# Patient Record
Sex: Male | Born: 1935 | Race: White | Hispanic: No | Marital: Married | Smoking: Former smoker
Health system: Southern US, Community
[De-identification: ages and names within clinical notes are randomized; demographics above are authoritative.]

## PROBLEM LIST (undated history)

## (undated) DIAGNOSIS — I2510 Atherosclerotic heart disease of native coronary artery without angina pectoris: Secondary | ICD-10-CM

## (undated) DIAGNOSIS — I10 Essential (primary) hypertension: Secondary | ICD-10-CM

## (undated) DIAGNOSIS — J449 Chronic obstructive pulmonary disease, unspecified: Secondary | ICD-10-CM

## (undated) DIAGNOSIS — N289 Disorder of kidney and ureter, unspecified: Secondary | ICD-10-CM

## (undated) DIAGNOSIS — I639 Cerebral infarction, unspecified: Secondary | ICD-10-CM

## (undated) DIAGNOSIS — Z950 Presence of cardiac pacemaker: Secondary | ICD-10-CM

## (undated) DIAGNOSIS — J439 Emphysema, unspecified: Secondary | ICD-10-CM

---

## 2003-10-19 ENCOUNTER — Other Ambulatory Visit: Payer: Self-pay

## 2004-09-09 ENCOUNTER — Ambulatory Visit: Payer: Self-pay | Admitting: Ophthalmology

## 2004-09-16 ENCOUNTER — Ambulatory Visit: Payer: Self-pay | Admitting: Ophthalmology

## 2006-02-04 ENCOUNTER — Ambulatory Visit: Payer: Self-pay | Admitting: Nurse Practitioner

## 2007-03-09 ENCOUNTER — Ambulatory Visit: Payer: Self-pay | Admitting: Unknown Physician Specialty

## 2009-03-25 ENCOUNTER — Inpatient Hospital Stay: Payer: Self-pay | Admitting: Internal Medicine

## 2009-10-30 ENCOUNTER — Ambulatory Visit: Payer: Self-pay | Admitting: Urology

## 2010-02-25 ENCOUNTER — Inpatient Hospital Stay: Payer: Self-pay | Admitting: Internal Medicine

## 2014-03-02 DIAGNOSIS — J449 Chronic obstructive pulmonary disease, unspecified: Secondary | ICD-10-CM | POA: Diagnosis present

## 2014-09-24 DIAGNOSIS — I340 Nonrheumatic mitral (valve) insufficiency: Secondary | ICD-10-CM | POA: Diagnosis present

## 2015-04-24 DIAGNOSIS — I10 Essential (primary) hypertension: Secondary | ICD-10-CM | POA: Diagnosis present

## 2015-05-01 DIAGNOSIS — I071 Rheumatic tricuspid insufficiency: Secondary | ICD-10-CM | POA: Diagnosis present

## 2016-02-05 ENCOUNTER — Other Ambulatory Visit: Payer: Self-pay | Admitting: Internal Medicine

## 2016-02-05 DIAGNOSIS — I739 Peripheral vascular disease, unspecified: Secondary | ICD-10-CM

## 2016-02-05 DIAGNOSIS — Z8673 Personal history of transient ischemic attack (TIA), and cerebral infarction without residual deficits: Secondary | ICD-10-CM

## 2016-02-07 ENCOUNTER — Ambulatory Visit
Admission: RE | Admit: 2016-02-07 | Discharge: 2016-02-07 | Disposition: A | Discharge status: Home / Self Care | Payer: Medicare Other | Source: Ambulatory Visit | Attending: Internal Medicine | Admitting: Internal Medicine

## 2016-02-07 DIAGNOSIS — I739 Peripheral vascular disease, unspecified: Secondary | ICD-10-CM | POA: Diagnosis present

## 2016-02-07 DIAGNOSIS — I6523 Occlusion and stenosis of bilateral carotid arteries: Secondary | ICD-10-CM | POA: Insufficient documentation

## 2016-09-30 ENCOUNTER — Other Ambulatory Visit: Payer: Self-pay | Admitting: Internal Medicine

## 2016-09-30 DIAGNOSIS — N644 Mastodynia: Secondary | ICD-10-CM

## 2016-09-30 DIAGNOSIS — N62 Hypertrophy of breast: Secondary | ICD-10-CM

## 2016-10-09 ENCOUNTER — Ambulatory Visit
Admission: RE | Admit: 2016-10-09 | Discharge: 2016-10-09 | Disposition: A | Discharge status: Home / Self Care | Payer: Medicare Other | Source: Ambulatory Visit | Attending: Internal Medicine | Admitting: Internal Medicine

## 2016-10-09 ENCOUNTER — Other Ambulatory Visit: Payer: Self-pay | Admitting: Internal Medicine

## 2016-10-09 DIAGNOSIS — N62 Hypertrophy of breast: Secondary | ICD-10-CM

## 2016-10-09 DIAGNOSIS — N644 Mastodynia: Secondary | ICD-10-CM

## 2017-04-19 DIAGNOSIS — I5022 Chronic systolic (congestive) heart failure: Secondary | ICD-10-CM | POA: Diagnosis present

## 2017-05-26 ENCOUNTER — Other Ambulatory Visit
Admission: RE | Admit: 2017-05-26 | Discharge: 2017-05-26 | Disposition: A | Discharge status: Home / Self Care | Payer: Medicare Other | Source: Ambulatory Visit | Attending: Internal Medicine | Admitting: Internal Medicine

## 2017-05-26 ENCOUNTER — Other Ambulatory Visit
Admission: RE | Admit: 2017-05-26 | Discharge: 2017-05-26 | Disposition: A | Discharge status: Home / Self Care | Payer: Medicare Other | Source: Ambulatory Visit | Attending: Unknown Physician Specialty | Admitting: Unknown Physician Specialty

## 2017-05-26 DIAGNOSIS — R194 Change in bowel habit: Secondary | ICD-10-CM | POA: Diagnosis present

## 2017-05-26 DIAGNOSIS — R197 Diarrhea, unspecified: Secondary | ICD-10-CM | POA: Diagnosis present

## 2017-05-26 LAB — GASTROINTESTINAL PANEL BY PCR, STOOL (REPLACES STOOL CULTURE)

## 2017-05-26 LAB — C DIFFICILE QUICK SCREEN W PCR REFLEX
C Diff antigen: NEGATIVE
C Diff interpretation: NOT DETECTED
C Diff toxin: NEGATIVE

## 2017-05-28 ENCOUNTER — Other Ambulatory Visit: Payer: Self-pay | Admitting: Gastroenterology

## 2017-05-28 DIAGNOSIS — R197 Diarrhea, unspecified: Secondary | ICD-10-CM

## 2017-05-28 DIAGNOSIS — R634 Abnormal weight loss: Secondary | ICD-10-CM

## 2017-05-28 DIAGNOSIS — R194 Change in bowel habit: Secondary | ICD-10-CM

## 2017-06-17 ENCOUNTER — Ambulatory Visit: Payer: Medicare Other

## 2019-09-01 ENCOUNTER — Other Ambulatory Visit: Payer: Self-pay | Admitting: Internal Medicine

## 2019-09-01 DIAGNOSIS — R310 Gross hematuria: Secondary | ICD-10-CM

## 2019-09-14 ENCOUNTER — Ambulatory Visit
Admission: RE | Admit: 2019-09-14 | Discharge: 2019-09-14 | Disposition: A | Discharge status: Home / Self Care | Payer: Medicare Other | Source: Ambulatory Visit | Attending: Internal Medicine | Admitting: Internal Medicine

## 2019-09-14 ENCOUNTER — Other Ambulatory Visit: Payer: Self-pay

## 2019-09-14 DIAGNOSIS — R310 Gross hematuria: Secondary | ICD-10-CM | POA: Diagnosis present

## 2019-11-20 ENCOUNTER — Other Ambulatory Visit: Payer: Self-pay | Admitting: Internal Medicine

## 2019-12-01 ENCOUNTER — Other Ambulatory Visit: Payer: Self-pay | Admitting: Internal Medicine

## 2019-12-01 DIAGNOSIS — R29898 Other symptoms and signs involving the musculoskeletal system: Secondary | ICD-10-CM

## 2019-12-01 DIAGNOSIS — M5116 Intervertebral disc disorders with radiculopathy, lumbar region: Secondary | ICD-10-CM

## 2019-12-05 ENCOUNTER — Other Ambulatory Visit: Payer: Self-pay

## 2019-12-05 ENCOUNTER — Ambulatory Visit
Admission: RE | Admit: 2019-12-05 | Discharge: 2019-12-05 | Disposition: A | Discharge status: Home / Self Care | Payer: Medicare PPO | Source: Ambulatory Visit | Attending: Internal Medicine | Admitting: Internal Medicine

## 2019-12-05 DIAGNOSIS — R29898 Other symptoms and signs involving the musculoskeletal system: Secondary | ICD-10-CM | POA: Insufficient documentation

## 2019-12-05 DIAGNOSIS — M5116 Intervertebral disc disorders with radiculopathy, lumbar region: Secondary | ICD-10-CM | POA: Diagnosis not present

## 2020-01-01 DIAGNOSIS — R2689 Other abnormalities of gait and mobility: Secondary | ICD-10-CM | POA: Diagnosis not present

## 2020-01-04 DIAGNOSIS — I482 Chronic atrial fibrillation, unspecified: Secondary | ICD-10-CM | POA: Diagnosis not present

## 2020-01-05 DIAGNOSIS — R2689 Other abnormalities of gait and mobility: Secondary | ICD-10-CM | POA: Diagnosis not present

## 2020-02-02 DIAGNOSIS — I480 Paroxysmal atrial fibrillation: Secondary | ICD-10-CM | POA: Diagnosis not present

## 2020-02-13 DIAGNOSIS — I495 Sick sinus syndrome: Secondary | ICD-10-CM | POA: Diagnosis not present

## 2020-02-20 DIAGNOSIS — R739 Hyperglycemia, unspecified: Secondary | ICD-10-CM | POA: Diagnosis not present

## 2020-02-20 DIAGNOSIS — I482 Chronic atrial fibrillation, unspecified: Secondary | ICD-10-CM | POA: Diagnosis not present

## 2020-02-20 DIAGNOSIS — E782 Mixed hyperlipidemia: Secondary | ICD-10-CM | POA: Diagnosis not present

## 2020-02-20 DIAGNOSIS — I10 Essential (primary) hypertension: Secondary | ICD-10-CM | POA: Diagnosis not present

## 2020-02-20 DIAGNOSIS — D692 Other nonthrombocytopenic purpura: Secondary | ICD-10-CM | POA: Diagnosis not present

## 2020-02-20 DIAGNOSIS — M1000 Idiopathic gout, unspecified site: Secondary | ICD-10-CM | POA: Diagnosis not present

## 2020-02-20 DIAGNOSIS — I480 Paroxysmal atrial fibrillation: Secondary | ICD-10-CM | POA: Diagnosis not present

## 2020-02-20 DIAGNOSIS — I739 Peripheral vascular disease, unspecified: Secondary | ICD-10-CM | POA: Diagnosis not present

## 2020-02-20 DIAGNOSIS — I25810 Atherosclerosis of coronary artery bypass graft(s) without angina pectoris: Secondary | ICD-10-CM | POA: Diagnosis not present

## 2020-02-29 DIAGNOSIS — D692 Other nonthrombocytopenic purpura: Secondary | ICD-10-CM | POA: Diagnosis not present

## 2020-02-29 DIAGNOSIS — I10 Essential (primary) hypertension: Secondary | ICD-10-CM | POA: Diagnosis not present

## 2020-02-29 DIAGNOSIS — I482 Chronic atrial fibrillation, unspecified: Secondary | ICD-10-CM | POA: Diagnosis not present

## 2020-02-29 DIAGNOSIS — I495 Sick sinus syndrome: Secondary | ICD-10-CM | POA: Diagnosis not present

## 2020-02-29 DIAGNOSIS — I739 Peripheral vascular disease, unspecified: Secondary | ICD-10-CM | POA: Diagnosis not present

## 2020-02-29 DIAGNOSIS — I272 Pulmonary hypertension, unspecified: Secondary | ICD-10-CM | POA: Diagnosis not present

## 2020-02-29 DIAGNOSIS — I5022 Chronic systolic (congestive) heart failure: Secondary | ICD-10-CM | POA: Diagnosis not present

## 2020-02-29 DIAGNOSIS — I340 Nonrheumatic mitral (valve) insufficiency: Secondary | ICD-10-CM | POA: Diagnosis not present

## 2020-02-29 DIAGNOSIS — I25810 Atherosclerosis of coronary artery bypass graft(s) without angina pectoris: Secondary | ICD-10-CM | POA: Diagnosis not present

## 2020-03-19 DIAGNOSIS — I272 Pulmonary hypertension, unspecified: Secondary | ICD-10-CM | POA: Diagnosis not present

## 2020-03-19 DIAGNOSIS — E782 Mixed hyperlipidemia: Secondary | ICD-10-CM | POA: Diagnosis not present

## 2020-03-19 DIAGNOSIS — I5022 Chronic systolic (congestive) heart failure: Secondary | ICD-10-CM | POA: Diagnosis not present

## 2020-03-19 DIAGNOSIS — I10 Essential (primary) hypertension: Secondary | ICD-10-CM | POA: Diagnosis not present

## 2020-03-19 DIAGNOSIS — I482 Chronic atrial fibrillation, unspecified: Secondary | ICD-10-CM | POA: Diagnosis not present

## 2020-03-19 DIAGNOSIS — I25810 Atherosclerosis of coronary artery bypass graft(s) without angina pectoris: Secondary | ICD-10-CM | POA: Diagnosis not present

## 2020-03-25 DIAGNOSIS — I480 Paroxysmal atrial fibrillation: Secondary | ICD-10-CM | POA: Diagnosis not present

## 2020-03-25 DIAGNOSIS — E039 Hypothyroidism, unspecified: Secondary | ICD-10-CM | POA: Diagnosis not present

## 2020-04-15 DIAGNOSIS — R791 Abnormal coagulation profile: Secondary | ICD-10-CM | POA: Diagnosis not present

## 2020-04-15 DIAGNOSIS — E039 Hypothyroidism, unspecified: Secondary | ICD-10-CM | POA: Diagnosis not present

## 2020-04-25 DIAGNOSIS — I480 Paroxysmal atrial fibrillation: Secondary | ICD-10-CM | POA: Diagnosis not present

## 2020-04-26 DIAGNOSIS — I482 Chronic atrial fibrillation, unspecified: Secondary | ICD-10-CM | POA: Diagnosis not present

## 2020-04-26 DIAGNOSIS — I25810 Atherosclerosis of coronary artery bypass graft(s) without angina pectoris: Secondary | ICD-10-CM | POA: Diagnosis not present

## 2020-05-02 DIAGNOSIS — I272 Pulmonary hypertension, unspecified: Secondary | ICD-10-CM | POA: Diagnosis not present

## 2020-05-02 DIAGNOSIS — I25810 Atherosclerosis of coronary artery bypass graft(s) without angina pectoris: Secondary | ICD-10-CM | POA: Diagnosis not present

## 2020-05-02 DIAGNOSIS — I482 Chronic atrial fibrillation, unspecified: Secondary | ICD-10-CM | POA: Diagnosis not present

## 2020-05-02 DIAGNOSIS — E782 Mixed hyperlipidemia: Secondary | ICD-10-CM | POA: Diagnosis not present

## 2020-05-02 DIAGNOSIS — I495 Sick sinus syndrome: Secondary | ICD-10-CM | POA: Diagnosis not present

## 2020-05-02 DIAGNOSIS — I5022 Chronic systolic (congestive) heart failure: Secondary | ICD-10-CM | POA: Diagnosis not present

## 2020-05-02 DIAGNOSIS — I10 Essential (primary) hypertension: Secondary | ICD-10-CM | POA: Diagnosis not present

## 2020-05-09 DIAGNOSIS — I10 Essential (primary) hypertension: Secondary | ICD-10-CM | POA: Diagnosis not present

## 2020-05-09 DIAGNOSIS — R0602 Shortness of breath: Secondary | ICD-10-CM | POA: Diagnosis not present

## 2020-05-09 DIAGNOSIS — I495 Sick sinus syndrome: Secondary | ICD-10-CM | POA: Diagnosis not present

## 2020-05-09 DIAGNOSIS — J449 Chronic obstructive pulmonary disease, unspecified: Secondary | ICD-10-CM | POA: Diagnosis not present

## 2020-05-09 DIAGNOSIS — Z01818 Encounter for other preprocedural examination: Secondary | ICD-10-CM | POA: Diagnosis not present

## 2020-05-09 DIAGNOSIS — I25810 Atherosclerosis of coronary artery bypass graft(s) without angina pectoris: Secondary | ICD-10-CM | POA: Diagnosis not present

## 2020-05-09 DIAGNOSIS — I482 Chronic atrial fibrillation, unspecified: Secondary | ICD-10-CM | POA: Diagnosis not present

## 2020-05-20 DIAGNOSIS — I480 Paroxysmal atrial fibrillation: Secondary | ICD-10-CM | POA: Diagnosis not present

## 2020-05-31 ENCOUNTER — Other Ambulatory Visit
Admission: RE | Admit: 2020-05-31 | Discharge: 2020-05-31 | Disposition: A | Discharge status: Home / Self Care | Payer: Medicare HMO | Source: Ambulatory Visit | Attending: Cardiology | Admitting: Cardiology

## 2020-05-31 ENCOUNTER — Other Ambulatory Visit: Payer: Self-pay

## 2020-05-31 DIAGNOSIS — Z20822 Contact with and (suspected) exposure to covid-19: Secondary | ICD-10-CM | POA: Diagnosis not present

## 2020-05-31 DIAGNOSIS — Z01812 Encounter for preprocedural laboratory examination: Secondary | ICD-10-CM | POA: Diagnosis not present

## 2020-05-31 LAB — SARS CORONAVIRUS 2 (TAT 6-24 HRS): SARS Coronavirus 2: NEGATIVE

## 2020-06-04 ENCOUNTER — Other Ambulatory Visit: Payer: Self-pay

## 2020-06-04 ENCOUNTER — Ambulatory Visit: Payer: Medicare HMO | Admitting: Certified Registered Nurse Anesthetist

## 2020-06-04 ENCOUNTER — Encounter: Payer: Self-pay | Admitting: Cardiology

## 2020-06-04 ENCOUNTER — Encounter
Admission: RE | Disposition: A | Discharge status: Home / Self Care | Payer: Self-pay | Source: Home / Self Care | Attending: Cardiology

## 2020-06-04 ENCOUNTER — Ambulatory Visit
Admission: RE | Admit: 2020-06-04 | Discharge: 2020-06-04 | Disposition: A | Discharge status: Home / Self Care | Payer: Medicare HMO | Attending: Cardiology | Admitting: Cardiology

## 2020-06-04 DIAGNOSIS — I509 Heart failure, unspecified: Secondary | ICD-10-CM | POA: Diagnosis not present

## 2020-06-04 DIAGNOSIS — J449 Chronic obstructive pulmonary disease, unspecified: Secondary | ICD-10-CM | POA: Insufficient documentation

## 2020-06-04 DIAGNOSIS — Z91013 Allergy to seafood: Secondary | ICD-10-CM | POA: Diagnosis not present

## 2020-06-04 DIAGNOSIS — I739 Peripheral vascular disease, unspecified: Secondary | ICD-10-CM | POA: Diagnosis not present

## 2020-06-04 DIAGNOSIS — I482 Chronic atrial fibrillation, unspecified: Secondary | ICD-10-CM | POA: Diagnosis not present

## 2020-06-04 DIAGNOSIS — Z880 Allergy status to penicillin: Secondary | ICD-10-CM | POA: Diagnosis not present

## 2020-06-04 DIAGNOSIS — I10 Essential (primary) hypertension: Secondary | ICD-10-CM | POA: Diagnosis not present

## 2020-06-04 DIAGNOSIS — Z7901 Long term (current) use of anticoagulants: Secondary | ICD-10-CM | POA: Insufficient documentation

## 2020-06-04 DIAGNOSIS — M1990 Unspecified osteoarthritis, unspecified site: Secondary | ICD-10-CM | POA: Insufficient documentation

## 2020-06-04 DIAGNOSIS — Z79899 Other long term (current) drug therapy: Secondary | ICD-10-CM | POA: Diagnosis not present

## 2020-06-04 DIAGNOSIS — Z45010 Encounter for checking and testing of cardiac pacemaker pulse generator [battery]: Secondary | ICD-10-CM | POA: Diagnosis not present

## 2020-06-04 DIAGNOSIS — Z79890 Hormone replacement therapy (postmenopausal): Secondary | ICD-10-CM | POA: Insufficient documentation

## 2020-06-04 DIAGNOSIS — Z888 Allergy status to other drugs, medicaments and biological substances status: Secondary | ICD-10-CM | POA: Diagnosis not present

## 2020-06-04 DIAGNOSIS — I110 Hypertensive heart disease with heart failure: Secondary | ICD-10-CM | POA: Diagnosis not present

## 2020-06-04 DIAGNOSIS — E785 Hyperlipidemia, unspecified: Secondary | ICD-10-CM | POA: Insufficient documentation

## 2020-06-04 DIAGNOSIS — I2510 Atherosclerotic heart disease of native coronary artery without angina pectoris: Secondary | ICD-10-CM | POA: Insufficient documentation

## 2020-06-04 DIAGNOSIS — Z87891 Personal history of nicotine dependence: Secondary | ICD-10-CM | POA: Diagnosis not present

## 2020-06-04 DIAGNOSIS — Z8673 Personal history of transient ischemic attack (TIA), and cerebral infarction without residual deficits: Secondary | ICD-10-CM | POA: Diagnosis not present

## 2020-06-04 DIAGNOSIS — I272 Pulmonary hypertension, unspecified: Secondary | ICD-10-CM | POA: Insufficient documentation

## 2020-06-04 DIAGNOSIS — E039 Hypothyroidism, unspecified: Secondary | ICD-10-CM | POA: Insufficient documentation

## 2020-06-04 DIAGNOSIS — I495 Sick sinus syndrome: Secondary | ICD-10-CM | POA: Insufficient documentation

## 2020-06-04 DIAGNOSIS — Z951 Presence of aortocoronary bypass graft: Secondary | ICD-10-CM | POA: Insufficient documentation

## 2020-06-04 DIAGNOSIS — Z45018 Encounter for adjustment and management of other part of cardiac pacemaker: Secondary | ICD-10-CM | POA: Diagnosis not present

## 2020-06-04 HISTORY — DX: Atherosclerotic heart disease of native coronary artery without angina pectoris: I25.10

## 2020-06-04 HISTORY — DX: Essential (primary) hypertension: I10

## 2020-06-04 HISTORY — DX: Presence of cardiac pacemaker: Z95.0

## 2020-06-04 HISTORY — DX: Chronic obstructive pulmonary disease, unspecified: J44.9

## 2020-06-04 HISTORY — PX: PACEMAKER INSERTION: SHX728

## 2020-06-04 SURGERY — INSERTION, CARDIAC PACEMAKER
Anesthesia: Monitor Anesthesia Care

## 2020-06-04 MED ORDER — VANCOMYCIN HCL IN DEXTROSE 1-5 GM/200ML-% IV SOLN: 1000.0000 mg | INTRAVENOUS | Status: DC

## 2020-06-04 MED ORDER — SODIUM CHLORIDE 0.9 % IV SOLN: INTRAVENOUS | Status: DC

## 2020-06-04 MED ORDER — FENTANYL CITRATE (PF) 100 MCG/2ML IJ SOLN: 25.0000 ug | INTRAMUSCULAR | Status: DC | PRN

## 2020-06-04 MED ORDER — PHENYLEPHRINE HCL (PRESSORS) 10 MG/ML IV SOLN
INTRAVENOUS | Status: DC | PRN
  Administered 2020-06-04 (×2): 100 ug via INTRAVENOUS

## 2020-06-04 MED ORDER — ONDANSETRON HCL 4 MG/2ML IJ SOLN: 4.0000 mg | Freq: Once | INTRAMUSCULAR | Status: DC | PRN

## 2020-06-04 MED ORDER — CLARITHROMYCIN 500 MG PO TABS: 500.0000 mg | ORAL_TABLET | Freq: Two times a day (BID) | ORAL | 0 refills | Status: AC

## 2020-06-04 MED ORDER — SODIUM CHLORIDE 0.9 % IV SOLN
80.0000 mg | INTRAVENOUS | Status: DC
  Filled 2020-06-04: qty 2

## 2020-06-04 MED ORDER — PROPOFOL 500 MG/50ML IV EMUL
INTRAVENOUS | Status: DC | PRN
  Administered 2020-06-04: 50 ug/kg/min via INTRAVENOUS

## 2020-06-04 MED ORDER — GENTAMICIN SULFATE 40 MG/ML IJ SOLN
INTRAMUSCULAR | Status: AC
  Filled 2020-06-04: qty 2

## 2020-06-04 MED ORDER — FENTANYL CITRATE (PF) 100 MCG/2ML IJ SOLN
INTRAMUSCULAR | Status: AC
  Filled 2020-06-04: qty 2

## 2020-06-04 MED ORDER — FENTANYL CITRATE (PF) 100 MCG/2ML IJ SOLN
INTRAMUSCULAR | Status: DC | PRN
Start: 2020-06-04 — End: 2020-06-04
  Administered 2020-06-04: 50 ug via INTRAVENOUS
  Administered 2020-06-04 (×2): 25 ug via INTRAVENOUS

## 2020-06-04 MED ORDER — LIDOCAINE 1 % OPTIME INJ - NO CHARGE
INTRAMUSCULAR | Status: DC | PRN
  Administered 2020-06-04: 25 mL

## 2020-06-04 MED ORDER — PROPOFOL 500 MG/50ML IV EMUL
INTRAVENOUS | Status: AC
  Filled 2020-06-04: qty 50

## 2020-06-04 MED ORDER — VANCOMYCIN HCL IN DEXTROSE 1-5 GM/200ML-% IV SOLN
INTRAVENOUS | Status: AC
  Filled 2020-06-04: qty 200

## 2020-06-04 MED ORDER — LIDOCAINE HCL (PF) 1 % IJ SOLN
INTRAMUSCULAR | Status: AC
  Filled 2020-06-04: qty 30

## 2020-06-04 MED ORDER — SODIUM CHLORIDE 0.9 % IV SOLN: INTRAVENOUS | Status: DC | PRN

## 2020-06-04 SURGICAL SUPPLY — 38 items
BAG DECANTER FOR FLEXI CONT (MISCELLANEOUS) ×3 IMPLANT
BRUSH SCRUB EZ  4% CHG (MISCELLANEOUS) ×2
BRUSH SCRUB EZ 4% CHG (MISCELLANEOUS) ×1 IMPLANT
CABLE SURG 12 DISP A/V CHANNEL (MISCELLANEOUS) ×3 IMPLANT
CANISTER SUCT 1200ML W/VALVE (MISCELLANEOUS) ×3 IMPLANT
CHLORAPREP W/TINT 26 (MISCELLANEOUS) ×3 IMPLANT
COVER LIGHT HANDLE STERIS (MISCELLANEOUS) ×6 IMPLANT
COVER MAYO STAND REUSABLE (DRAPES) ×3 IMPLANT
COVER WAND RF STERILE (DRAPES) ×3 IMPLANT
DRAPE C-ARM XRAY 36X54 (DRAPES) IMPLANT
DRSG TEGADERM 4X4.75 (GAUZE/BANDAGES/DRESSINGS) ×3 IMPLANT
DRSG TELFA 4X3 1S NADH ST (GAUZE/BANDAGES/DRESSINGS) ×3 IMPLANT
ELECT REM PT RETURN 9FT ADLT (ELECTROSURGICAL) ×3
ELECTRODE REM PT RTRN 9FT ADLT (ELECTROSURGICAL) ×1 IMPLANT
GLIDEWIRE STIFF .35X180X3 HYDR (WIRE) IMPLANT
GLOVE BIO SURGEON STRL SZ7.5 (GLOVE) ×3 IMPLANT
GLOVE BIO SURGEON STRL SZ8 (GLOVE) ×3 IMPLANT
GOWN STRL REUS W/ TWL LRG LVL3 (GOWN DISPOSABLE) ×1 IMPLANT
GOWN STRL REUS W/ TWL XL LVL3 (GOWN DISPOSABLE) ×1 IMPLANT
GOWN STRL REUS W/TWL LRG LVL3 (GOWN DISPOSABLE) ×2
GOWN STRL REUS W/TWL XL LVL3 (GOWN DISPOSABLE) ×2
IMMOBILIZER SHDR MD LX WHT (SOFTGOODS) IMPLANT
IMMOBILIZER SHDR XL LX WHT (SOFTGOODS) IMPLANT
INTRO PACEMAKR LEAD 9FR 13CM (INTRODUCER) ×3
INTRO PACEMKR SHEATH II 7FR (MISCELLANEOUS)
INTRODUCER PACEMKR LD 9FR 13CM (INTRODUCER) ×1 IMPLANT
INTRODUCER PACEMKR SHTH II 7FR (MISCELLANEOUS) IMPLANT
IPG PACE AZUR XT SR MRI W1SR01 (Pacemaker) ×1 IMPLANT
IV NS 500ML (IV SOLUTION) ×2
IV NS 500ML BAXH (IV SOLUTION) ×1 IMPLANT
KIT TURNOVER KIT A (KITS) ×3 IMPLANT
KIT WRENCH (KITS) ×3 IMPLANT
LABEL OR SOLS (LABEL) ×3 IMPLANT
MARKER SKIN DUAL TIP RULER LAB (MISCELLANEOUS) ×3 IMPLANT
PACE AZURE XT SR MRI W1SR01 (Pacemaker) ×3 IMPLANT
PACK PACE INSERTION (MISCELLANEOUS) ×3 IMPLANT
PAD ONESTEP ZOLL R SERIES ADT (MISCELLANEOUS) ×3 IMPLANT
SUT SILK 0 SH 30 (SUTURE) ×9 IMPLANT

## 2020-06-04 NOTE — Anesthesia Preprocedure Evaluation (Addendum)
Anesthesia Evaluation  Patient identified by MRN, date of birth, ID band Patient awake    Reviewed: Allergy & Precautions, NPO status , Patient's Chart, lab work & pertinent test results, reviewed documented beta blocker date and time   Airway Mallampati: III       Dental   Pulmonary COPD,    Pulmonary exam normal        Cardiovascular hypertension, Pt. on medications and Pt. on home beta blockers + Peripheral Vascular Disease and +CHF  + dysrhythmias Atrial Fibrillation + pacemaker + Valvular Problems/Murmurs MR   + Pulmonary HTN Sick sinus syndrome   Neuro/Psych TIAnegative psych ROS   GI/Hepatic Neg liver ROS,   Endo/Other  Hypothyroidism   Renal/GU      Musculoskeletal  (+) Arthritis , Osteoarthritis,    Abdominal Normal abdominal exam  (+)   Peds  Hematology negative hematology ROS (+)   Anesthesia Other Findings EF 35-40%  Reproductive/Obstetrics                          Anesthesia Physical Anesthesia Plan  ASA: III  Anesthesia Plan: MAC   Post-op Pain Management:    Induction:   PONV Risk Score and Plan: TIVA  Airway Management Planned: Nasal Cannula  Additional Equipment:   Intra-op Plan:   Post-operative Plan:   Informed Consent: I have reviewed the patients History and Physical, chart, labs and discussed the procedure including the risks, benefits and alternatives for the proposed anesthesia with the patient or authorized representative who has indicated his/her understanding and acceptance.     Dental advisory given  Plan Discussed with: CRNA and Surgeon  Anesthesia Plan Comments:         Anesthesia Quick Evaluation

## 2020-06-04 NOTE — Op Note (Signed)
[facility]   06/04/2020                     1:00 PM  PATIENT:  Bryan Singleton    PRE-OPERATIVE DIAGNOSIS:  pacemaker change out  POST-OPERATIVE DIAGNOSIS:  Same  PROCEDURE:  GENERATOR Change out  SURGEON:  Marcina Millard, MD    ANESTHESIA:     PREOPERATIVE INDICATIONS:  Bryan Singleton is a  86 y.o. male with a diagnosis of pacemaker change out who failed conservative measures and elected for surgical management.    The risks benefits and alternatives were discussed with the patient preoperatively including but not limited to the risks of infection, bleeding, cardiopulmonary complications, the need for revision surgery, among others, and the patient was willing to proceed.   OPERATIVE PROCEDURE: The patient was brought to the operating room in a fasting state.  The left pectoral region was prepped and draped in usual sterile manner.  Anesthesia was obtained 1% lidocaine locally.  A 6 cm incision was performed over the left pectoral region.  The existing pacemaker generator was retrieved by electrocautery and blunt dissection.  The lead was disconnected and connected to a new single-chamber rate responsive pacemaker generator (Medtronic Azure XT SR MRI SureScan RNA [id number]).  The pacemaker pocket was irrigated with gentamicin solution.  The new pacemaker generator was positioned into the pocket and the pocket was closed with 2-0 and 4-0 Vicryl, respectively.  Steri-Strips are present dressing were applied.  Postprocedural interrogation revealed appropriate ventricular atrial and sensing thresholds.  There were no periprocedural complications.

## 2020-06-04 NOTE — Discharge Instructions (Signed)
May remove outer bandage on 06/05/2020.  Leave Steri-Strips on.  May shower on 06/06/2020.   AMBULATORY SURGERY  DISCHARGE INSTRUCTIONS   1) The drugs that you were given will stay in your system until tomorrow so for the next 24 hours you should not:  A) Drive an automobile B) Make any legal decisions C) Drink any alcoholic beverage   2) You may resume regular meals tomorrow.  Today it is better to start with liquids and gradually work up to solid foods.  You may eat anything you prefer, but it is better to start with liquids, then soup and crackers, and gradually work up to solid foods.   3) Please notify your doctor immediately if you have any unusual bleeding, trouble breathing, redness and pain at the surgery site, drainage, fever, or pain not relieved by medication.    4) Additional Instructions:        Please contact your physician with any problems or Same Day Surgery at [phone], Monday through Friday 6 am to 4 pm, or [place] [facility] number at [phone].

## 2020-06-04 NOTE — Progress Notes (Signed)
  Patient with sick sinus syndrome, pacemaker at end of life, requires generator change out.

## 2020-06-04 NOTE — Transfer of Care (Signed)
Immediate Anesthesia Transfer of Care Note  Patient: Bryan Singleton  Procedure(s) Performed: GENERATOR Change out (N/A )  Patient Location: PACU  Anesthesia Type:General  Level of Consciousness: awake  Airway & Oxygen Therapy: Patient Spontanous Breathing and Patient connected to face mask oxygen  Post-op Assessment: Report given to RN  Post vital signs: stable  Last Vitals:  Vitals Value Taken Time  BP 120/75 06/04/20 1254  Temp    Pulse 70 06/04/20 1255  Resp 11 06/04/20 1255  SpO2 97 % 06/04/20 1255  Vitals shown include unvalidated device data.  Last Pain:  Vitals:   06/04/20 1133  TempSrc: Temporal         Complications: No complications documented.

## 2020-06-04 NOTE — Progress Notes (Signed)
Wallet, hearing aids and glasses returned to patient

## 2020-06-05 ENCOUNTER — Encounter: Payer: Self-pay | Admitting: Cardiology

## 2020-06-06 NOTE — Anesthesia Postprocedure Evaluation (Signed)
Anesthesia Post Note  Patient: Bryan Singleton  Procedure(s) Performed: GENERATOR Change out (N/A )  Patient location during evaluation: PACU Anesthesia Type: MAC Level of consciousness: awake and alert and oriented Pain management: pain level controlled Vital Signs Assessment: post-procedure vital signs reviewed and stable Respiratory status: spontaneous breathing Cardiovascular status: blood pressure returned to baseline Anesthetic complications: no   No complications documented.   Last Vitals:  Vitals:   06/04/20 1325 06/04/20 1336  BP: 125/73 (!) 148/82  Pulse: 69 70  Resp: 17 16  Temp: 36.5 C 36.6 C  SpO2: 93% 98%    Last Pain:  Vitals:   06/04/20 1336  TempSrc: Temporal  PainSc: 0-No pain                 Marlis Oldaker

## 2020-06-11 DIAGNOSIS — I25810 Atherosclerosis of coronary artery bypass graft(s) without angina pectoris: Secondary | ICD-10-CM | POA: Diagnosis not present

## 2020-06-11 DIAGNOSIS — I5022 Chronic systolic (congestive) heart failure: Secondary | ICD-10-CM | POA: Diagnosis not present

## 2020-06-11 DIAGNOSIS — I10 Essential (primary) hypertension: Secondary | ICD-10-CM | POA: Diagnosis not present

## 2020-06-11 DIAGNOSIS — I482 Chronic atrial fibrillation, unspecified: Secondary | ICD-10-CM | POA: Diagnosis not present

## 2020-06-12 DIAGNOSIS — Z23 Encounter for immunization: Secondary | ICD-10-CM | POA: Diagnosis not present

## 2020-06-17 ENCOUNTER — Encounter: Payer: Self-pay | Admitting: Cardiology

## 2020-06-19 DIAGNOSIS — I480 Paroxysmal atrial fibrillation: Secondary | ICD-10-CM | POA: Diagnosis not present

## 2020-07-01 ENCOUNTER — Ambulatory Visit: Payer: Medicare HMO | Attending: Internal Medicine

## 2020-07-01 DIAGNOSIS — Z23 Encounter for immunization: Secondary | ICD-10-CM

## 2020-07-01 NOTE — Progress Notes (Signed)
   Covid-19 Vaccination Clinic  Name:  Bryan Singleton    MRN: [id number] DOB: [birth year]  07/01/2020  Bryan Singleton was observed post Covid-19 immunization for 15 minutes without incident. He was provided with Vaccine Information Sheet and instruction to access the V-Safe system.   Bryan Singleton was instructed to call 911 with any severe reactions post vaccine: Marland Kitchen Difficulty breathing  . Swelling of face and throat  . A fast heartbeat  . A bad rash all over body  . Dizziness and weakness

## 2020-08-12 DIAGNOSIS — R270 Ataxia, unspecified: Secondary | ICD-10-CM | POA: Diagnosis not present

## 2020-08-12 DIAGNOSIS — R202 Paresthesia of skin: Secondary | ICD-10-CM | POA: Diagnosis not present

## 2020-08-12 DIAGNOSIS — R200 Anesthesia of skin: Secondary | ICD-10-CM | POA: Diagnosis not present

## 2020-08-12 DIAGNOSIS — G629 Polyneuropathy, unspecified: Secondary | ICD-10-CM | POA: Diagnosis not present

## 2020-08-26 DIAGNOSIS — E782 Mixed hyperlipidemia: Secondary | ICD-10-CM | POA: Diagnosis not present

## 2020-08-26 DIAGNOSIS — M1000 Idiopathic gout, unspecified site: Secondary | ICD-10-CM | POA: Diagnosis not present

## 2020-08-26 DIAGNOSIS — I25810 Atherosclerosis of coronary artery bypass graft(s) without angina pectoris: Secondary | ICD-10-CM | POA: Diagnosis not present

## 2020-08-26 DIAGNOSIS — I480 Paroxysmal atrial fibrillation: Secondary | ICD-10-CM | POA: Diagnosis not present

## 2020-08-26 DIAGNOSIS — I10 Essential (primary) hypertension: Secondary | ICD-10-CM | POA: Diagnosis not present

## 2020-08-26 DIAGNOSIS — R739 Hyperglycemia, unspecified: Secondary | ICD-10-CM | POA: Diagnosis not present

## 2020-08-26 DIAGNOSIS — D692 Other nonthrombocytopenic purpura: Secondary | ICD-10-CM | POA: Diagnosis not present

## 2020-09-02 DIAGNOSIS — D6869 Other thrombophilia: Secondary | ICD-10-CM | POA: Diagnosis not present

## 2020-09-02 DIAGNOSIS — I272 Pulmonary hypertension, unspecified: Secondary | ICD-10-CM | POA: Diagnosis not present

## 2020-09-02 DIAGNOSIS — I509 Heart failure, unspecified: Secondary | ICD-10-CM | POA: Diagnosis not present

## 2020-09-02 DIAGNOSIS — I110 Hypertensive heart disease with heart failure: Secondary | ICD-10-CM | POA: Diagnosis not present

## 2020-09-02 DIAGNOSIS — I739 Peripheral vascular disease, unspecified: Secondary | ICD-10-CM | POA: Diagnosis not present

## 2020-09-02 DIAGNOSIS — Z0000 Encounter for general adult medical examination without abnormal findings: Secondary | ICD-10-CM | POA: Diagnosis not present

## 2020-09-02 DIAGNOSIS — I482 Chronic atrial fibrillation, unspecified: Secondary | ICD-10-CM | POA: Diagnosis not present

## 2020-09-02 DIAGNOSIS — I495 Sick sinus syndrome: Secondary | ICD-10-CM | POA: Diagnosis not present

## 2020-09-02 DIAGNOSIS — D692 Other nonthrombocytopenic purpura: Secondary | ICD-10-CM | POA: Diagnosis not present

## 2020-09-04 DIAGNOSIS — I10 Essential (primary) hypertension: Secondary | ICD-10-CM | POA: Diagnosis not present

## 2020-09-04 DIAGNOSIS — I482 Chronic atrial fibrillation, unspecified: Secondary | ICD-10-CM | POA: Diagnosis not present

## 2020-09-04 DIAGNOSIS — I5022 Chronic systolic (congestive) heart failure: Secondary | ICD-10-CM | POA: Diagnosis not present

## 2020-09-04 DIAGNOSIS — I272 Pulmonary hypertension, unspecified: Secondary | ICD-10-CM | POA: Diagnosis not present

## 2020-09-04 DIAGNOSIS — I25810 Atherosclerosis of coronary artery bypass graft(s) without angina pectoris: Secondary | ICD-10-CM | POA: Diagnosis not present

## 2020-09-11 DIAGNOSIS — D649 Anemia, unspecified: Secondary | ICD-10-CM | POA: Diagnosis not present

## 2020-10-01 DIAGNOSIS — M79604 Pain in right leg: Secondary | ICD-10-CM | POA: Diagnosis not present

## 2020-10-01 DIAGNOSIS — R202 Paresthesia of skin: Secondary | ICD-10-CM | POA: Diagnosis not present

## 2020-10-01 DIAGNOSIS — M79605 Pain in left leg: Secondary | ICD-10-CM | POA: Diagnosis not present

## 2020-10-01 DIAGNOSIS — G629 Polyneuropathy, unspecified: Secondary | ICD-10-CM | POA: Diagnosis not present

## 2020-10-01 DIAGNOSIS — R270 Ataxia, unspecified: Secondary | ICD-10-CM | POA: Diagnosis not present

## 2020-10-01 DIAGNOSIS — R200 Anesthesia of skin: Secondary | ICD-10-CM | POA: Diagnosis not present

## 2020-11-15 ENCOUNTER — Other Ambulatory Visit: Payer: Self-pay

## 2020-11-15 ENCOUNTER — Other Ambulatory Visit: Payer: Self-pay | Admitting: Physician Assistant

## 2020-11-15 ENCOUNTER — Ambulatory Visit
Admission: RE | Admit: 2020-11-15 | Discharge: 2020-11-15 | Disposition: A | Discharge status: Home / Self Care | Payer: Medicare HMO | Source: Ambulatory Visit | Attending: Physician Assistant | Admitting: Physician Assistant

## 2020-11-15 DIAGNOSIS — M7989 Other specified soft tissue disorders: Secondary | ICD-10-CM

## 2020-11-15 DIAGNOSIS — R600 Localized edema: Secondary | ICD-10-CM | POA: Diagnosis not present

## 2020-11-15 DIAGNOSIS — M109 Gout, unspecified: Secondary | ICD-10-CM | POA: Diagnosis not present

## 2020-12-09 DIAGNOSIS — R202 Paresthesia of skin: Secondary | ICD-10-CM | POA: Diagnosis not present

## 2020-12-09 DIAGNOSIS — G629 Polyneuropathy, unspecified: Secondary | ICD-10-CM | POA: Diagnosis not present

## 2020-12-09 DIAGNOSIS — M79605 Pain in left leg: Secondary | ICD-10-CM | POA: Diagnosis not present

## 2020-12-09 DIAGNOSIS — R200 Anesthesia of skin: Secondary | ICD-10-CM | POA: Diagnosis not present

## 2020-12-09 DIAGNOSIS — R270 Ataxia, unspecified: Secondary | ICD-10-CM | POA: Diagnosis not present

## 2020-12-09 DIAGNOSIS — M79604 Pain in right leg: Secondary | ICD-10-CM | POA: Diagnosis not present

## 2020-12-09 DIAGNOSIS — R278 Other lack of coordination: Secondary | ICD-10-CM | POA: Diagnosis not present

## 2021-02-25 DIAGNOSIS — D649 Anemia, unspecified: Secondary | ICD-10-CM | POA: Diagnosis not present

## 2021-02-25 DIAGNOSIS — I10 Essential (primary) hypertension: Secondary | ICD-10-CM | POA: Diagnosis not present

## 2021-02-25 DIAGNOSIS — M1000 Idiopathic gout, unspecified site: Secondary | ICD-10-CM | POA: Diagnosis not present

## 2021-02-25 DIAGNOSIS — R739 Hyperglycemia, unspecified: Secondary | ICD-10-CM | POA: Diagnosis not present

## 2021-02-25 DIAGNOSIS — E782 Mixed hyperlipidemia: Secondary | ICD-10-CM | POA: Diagnosis not present

## 2021-03-03 DIAGNOSIS — J439 Emphysema, unspecified: Secondary | ICD-10-CM | POA: Diagnosis not present

## 2021-03-03 DIAGNOSIS — I272 Pulmonary hypertension, unspecified: Secondary | ICD-10-CM | POA: Diagnosis not present

## 2021-03-03 DIAGNOSIS — D692 Other nonthrombocytopenic purpura: Secondary | ICD-10-CM | POA: Diagnosis not present

## 2021-03-03 DIAGNOSIS — I495 Sick sinus syndrome: Secondary | ICD-10-CM | POA: Diagnosis not present

## 2021-03-03 DIAGNOSIS — E118 Type 2 diabetes mellitus with unspecified complications: Secondary | ICD-10-CM | POA: Diagnosis present

## 2021-03-03 DIAGNOSIS — I482 Chronic atrial fibrillation, unspecified: Secondary | ICD-10-CM | POA: Diagnosis not present

## 2021-03-03 DIAGNOSIS — I739 Peripheral vascular disease, unspecified: Secondary | ICD-10-CM | POA: Diagnosis not present

## 2021-03-03 DIAGNOSIS — I25810 Atherosclerosis of coronary artery bypass graft(s) without angina pectoris: Secondary | ICD-10-CM | POA: Diagnosis not present

## 2021-03-03 DIAGNOSIS — I5022 Chronic systolic (congestive) heart failure: Secondary | ICD-10-CM | POA: Diagnosis not present

## 2021-03-03 DIAGNOSIS — I110 Hypertensive heart disease with heart failure: Secondary | ICD-10-CM | POA: Diagnosis not present

## 2021-03-11 DIAGNOSIS — I10 Essential (primary) hypertension: Secondary | ICD-10-CM | POA: Diagnosis not present

## 2021-03-11 DIAGNOSIS — I482 Chronic atrial fibrillation, unspecified: Secondary | ICD-10-CM | POA: Diagnosis not present

## 2021-03-11 DIAGNOSIS — I5022 Chronic systolic (congestive) heart failure: Secondary | ICD-10-CM | POA: Diagnosis not present

## 2021-03-12 DIAGNOSIS — G629 Polyneuropathy, unspecified: Secondary | ICD-10-CM | POA: Diagnosis not present

## 2021-03-12 DIAGNOSIS — R200 Anesthesia of skin: Secondary | ICD-10-CM | POA: Diagnosis not present

## 2021-03-12 DIAGNOSIS — R270 Ataxia, unspecified: Secondary | ICD-10-CM | POA: Diagnosis not present

## 2021-03-12 DIAGNOSIS — R202 Paresthesia of skin: Secondary | ICD-10-CM | POA: Diagnosis not present

## 2021-03-13 DIAGNOSIS — M778 Other enthesopathies, not elsewhere classified: Secondary | ICD-10-CM | POA: Diagnosis not present

## 2021-03-13 DIAGNOSIS — G5761 Lesion of plantar nerve, right lower limb: Secondary | ICD-10-CM | POA: Diagnosis not present

## 2021-03-13 DIAGNOSIS — G5763 Lesion of plantar nerve, bilateral lower limbs: Secondary | ICD-10-CM | POA: Diagnosis not present

## 2021-03-13 DIAGNOSIS — M2042 Other hammer toe(s) (acquired), left foot: Secondary | ICD-10-CM | POA: Diagnosis not present

## 2021-03-13 DIAGNOSIS — M2041 Other hammer toe(s) (acquired), right foot: Secondary | ICD-10-CM | POA: Diagnosis not present

## 2021-03-13 DIAGNOSIS — G5762 Lesion of plantar nerve, left lower limb: Secondary | ICD-10-CM | POA: Diagnosis not present

## 2021-03-23 IMAGING — CT CT L SPINE W/O CM
3 of 4 series · 9 of 33 positions shown, 10 images · non-contrast
Comparison: Abdominopelvic CT 09/14/2019.

CLINICAL DATA: Right leg weakness with bilateral leg numbness for 2
months. No acute injury or prior relevant surgery.

EXAM:
CT LUMBAR SPINE WITHOUT CONTRAST
TECHNIQUE: Multidetector CT imaging of the lumbar spine was performed without
intravenous contrast administration. Multiplanar CT image
reconstructions were also generated.

[Series 4: l spine soft · axial · 0.30mm/px · z∈[-198,-198]mm · 1 of 131 slices shown, 2 images]
[im 71/131  soft-tissue]
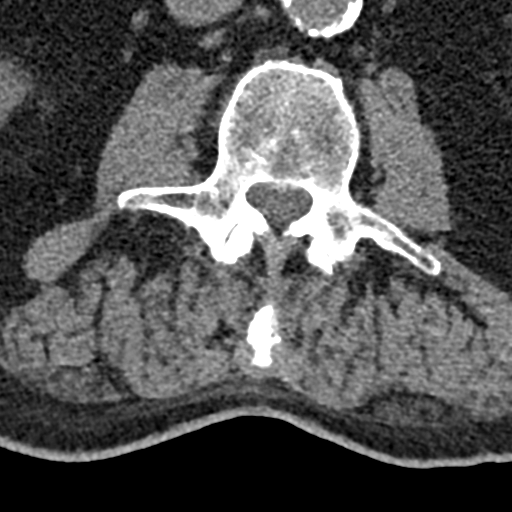
[im 71/131  bone]
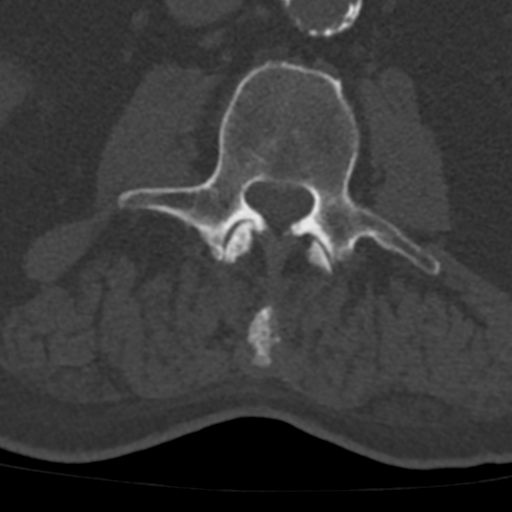

[Series 7: sagittal bone · sagittal · 0.31mm/px · 5 of 50 slices shown]
[im 17/50  bone]
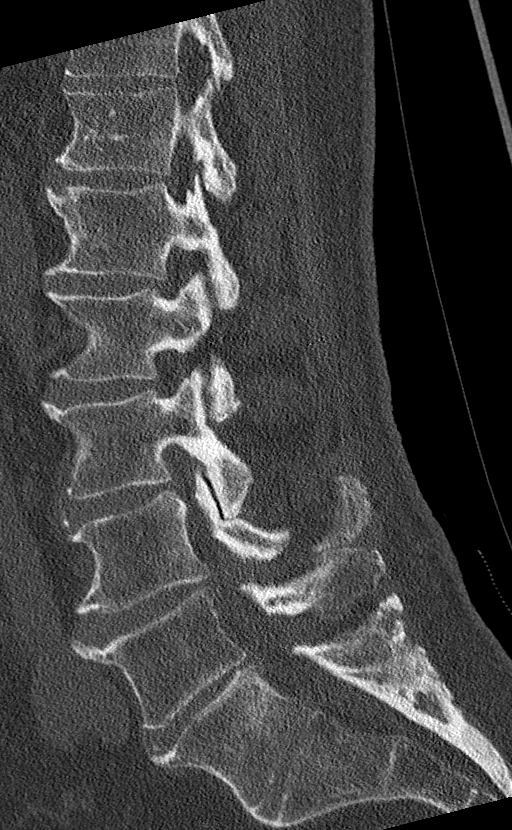
[im 21/50  bone]
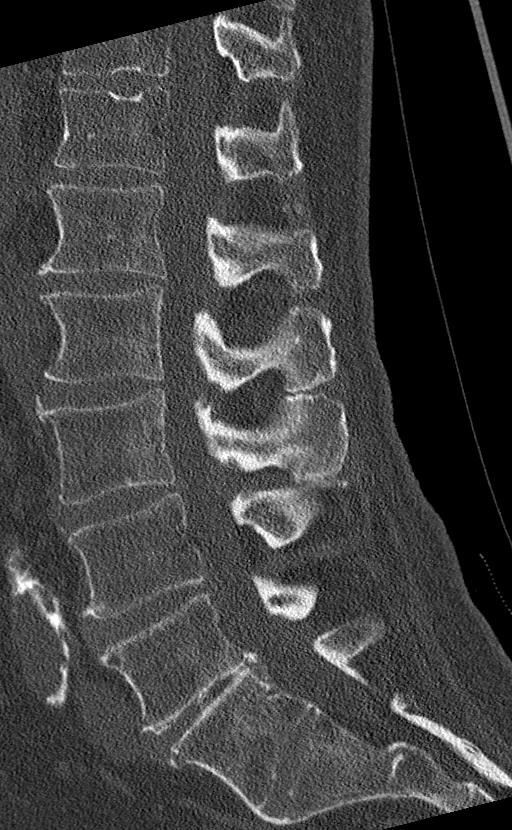
[im 25/50  bone]
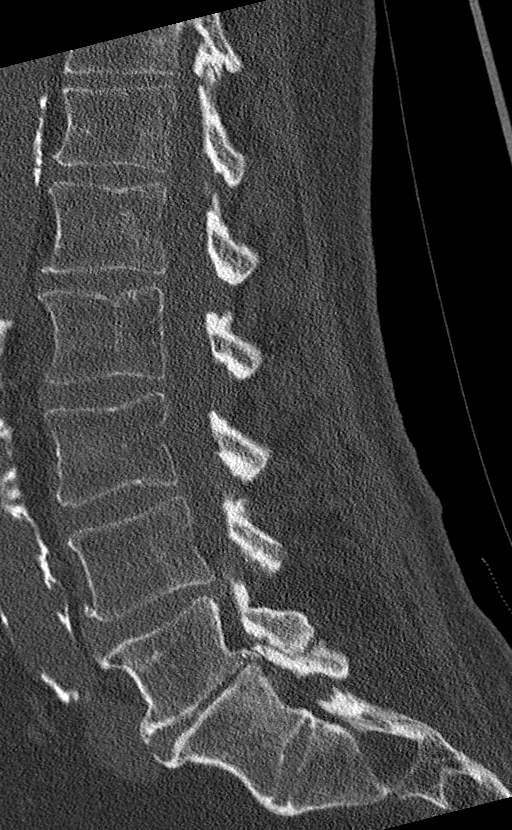
[im 29/50  bone]
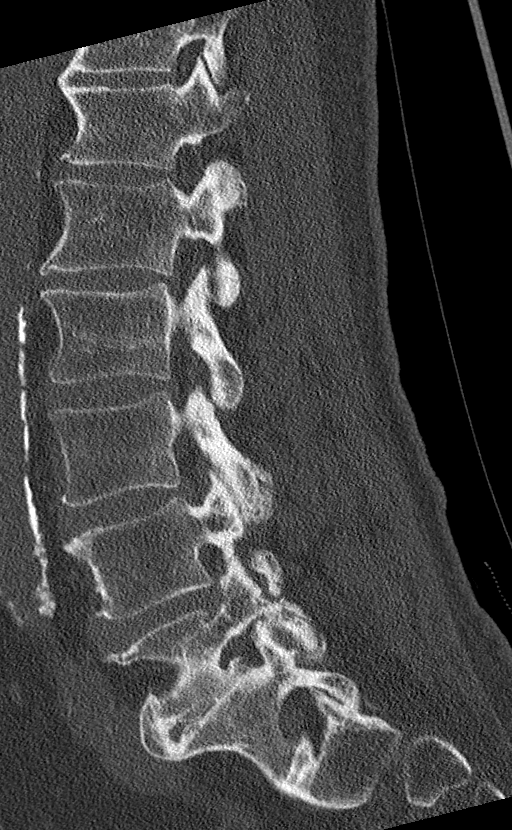
[im 33/50  bone]
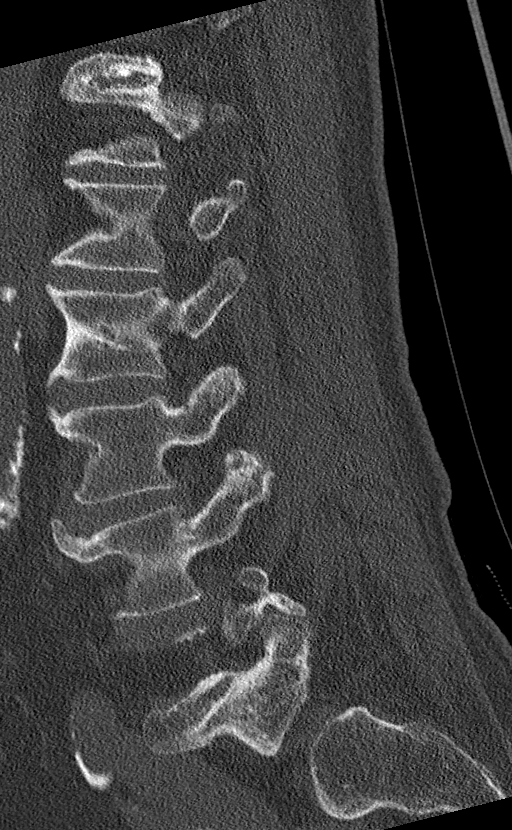

[Series 8: coronal bone · coronal · 0.19mm/px · 3 of 81 slices shown]
[im 21/81  bone]
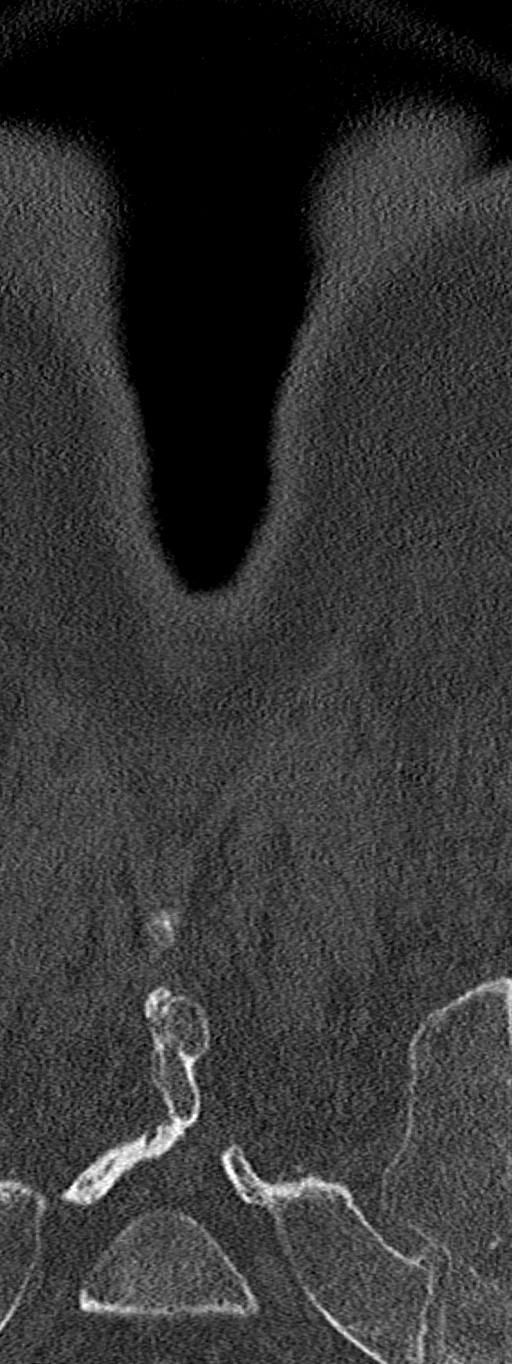
[im 41/81  bone]
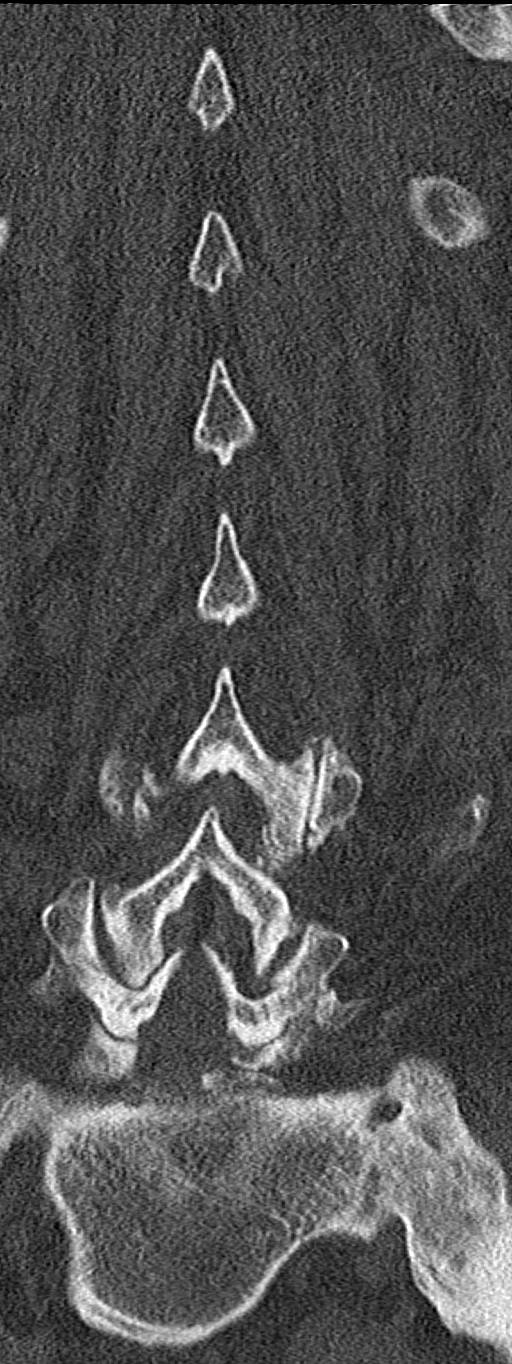
[im 61/81  bone]
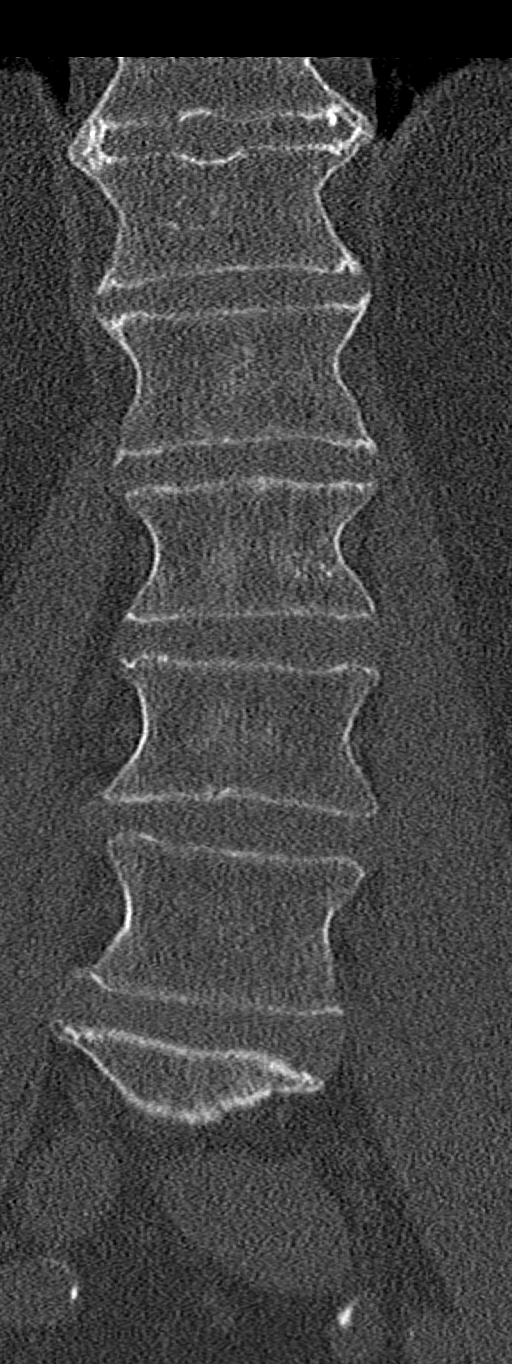

[9 of 33 positions shown; findings below may reference images not displayed]

FINDINGS: Segmentation: Transitional lumbosacral anatomy. For the purposes of
this report, it is assumed that there are distal ribs at L1 and a
transitional, partially lumbarized S1 segment.

Alignment: Minimal convex left scoliosis. The lateral alignment is
normal.

Vertebrae: No evidence of acute fracture or pars defect. There are
scattered endplate and paraspinal osteophytes. There are partially
bridging osteophytes anteriorly across both sacroiliac joints.
Sclerotic lesion in the left iliac bone is stable (image 121/3).

Paraspinal and other soft tissues: No significant paraspinal
findings. Mild aortic and branch vessel atherosclerosis.

Disc levels:

T11-12: Mild disc bulging with lateral endplate osteophytes. No
spinal stenosis or nerve root encroachment.

T12-L1: Mild disc bulging with lateral endplate osteophytes. No
spinal stenosis or nerve root encroachment.

L1-2: Mild disc bulging with anterior osteophytes. No spinal
stenosis or nerve root encroachment.

L2-3: Mild disc bulging with anterior osteophytes. Mild facet
hypertrophy. No significant spinal stenosis or nerve root
encroachment.

L3-4: Mild disc bulging with new asymmetric soft tissue density
laterally in the right foramen (image 68/4), suspicious for a disc
fragment encroaching on the right L3 nerve root. Moderate facet and
ligamentous hypertrophy contribute to mild spinal stenosis and mild
narrowing of the lateral recesses. The left foramen is patent.

L4-5: Mild loss of disc height with annular disc bulging and
endplate osteophytes. Moderate facet and ligamentous hypertrophy.
Resulting mild to moderate spinal stenosis and mild narrowing of the
lateral recesses and foramina bilaterally.

L5-S1: Disc bulging and endplate osteophytes are asymmetric to the
left. There is resulting moderate osseous foraminal narrowing on the
left which appears chronic. Mild bilateral facet hypertrophy. The
right foramen is patent.

S1-2: As number, this is a transitional disc space level without
acquired abnormality.
IMPRESSION: 1. Transitional lumbosacral anatomy. Transitional segment is
assigned S1. Careful correlation with this numbering recommended
prior to any surgical intervention.
2. Suspected extraforaminal disc extrusion on the right at L3-4
causing right L3 nerve root encroachment. Correlate with radicular
symptoms.
3. Additional multilevel spondylosis as described with disc bulging,
endplate osteophytes and facet hypertrophy. There is resulting mild
to moderate spinal stenosis at L4-5 and moderate osseous foraminal
narrowing on the left at L5-S1.
4. Aortic Atherosclerosis ([id number]).

## 2021-04-08 DIAGNOSIS — I495 Sick sinus syndrome: Secondary | ICD-10-CM | POA: Diagnosis not present

## 2021-09-01 DIAGNOSIS — I482 Chronic atrial fibrillation, unspecified: Secondary | ICD-10-CM | POA: Diagnosis not present

## 2021-09-01 DIAGNOSIS — I495 Sick sinus syndrome: Secondary | ICD-10-CM | POA: Diagnosis not present

## 2021-09-01 DIAGNOSIS — M1000 Idiopathic gout, unspecified site: Secondary | ICD-10-CM | POA: Diagnosis not present

## 2021-09-01 DIAGNOSIS — I25810 Atherosclerosis of coronary artery bypass graft(s) without angina pectoris: Secondary | ICD-10-CM | POA: Diagnosis not present

## 2021-09-01 DIAGNOSIS — R739 Hyperglycemia, unspecified: Secondary | ICD-10-CM | POA: Diagnosis not present

## 2021-09-01 DIAGNOSIS — I10 Essential (primary) hypertension: Secondary | ICD-10-CM | POA: Diagnosis not present

## 2021-09-01 DIAGNOSIS — E782 Mixed hyperlipidemia: Secondary | ICD-10-CM | POA: Diagnosis not present

## 2021-09-05 DIAGNOSIS — J439 Emphysema, unspecified: Secondary | ICD-10-CM | POA: Diagnosis not present

## 2021-09-05 DIAGNOSIS — E1151 Type 2 diabetes mellitus with diabetic peripheral angiopathy without gangrene: Secondary | ICD-10-CM | POA: Diagnosis not present

## 2021-09-05 DIAGNOSIS — I482 Chronic atrial fibrillation, unspecified: Secondary | ICD-10-CM | POA: Diagnosis not present

## 2021-09-05 DIAGNOSIS — I5022 Chronic systolic (congestive) heart failure: Secondary | ICD-10-CM | POA: Diagnosis not present

## 2021-09-05 DIAGNOSIS — I272 Pulmonary hypertension, unspecified: Secondary | ICD-10-CM | POA: Diagnosis not present

## 2021-09-05 DIAGNOSIS — I495 Sick sinus syndrome: Secondary | ICD-10-CM | POA: Diagnosis not present

## 2021-09-05 DIAGNOSIS — I110 Hypertensive heart disease with heart failure: Secondary | ICD-10-CM | POA: Diagnosis not present

## 2021-09-05 DIAGNOSIS — D6869 Other thrombophilia: Secondary | ICD-10-CM | POA: Diagnosis not present

## 2021-09-05 DIAGNOSIS — E119 Type 2 diabetes mellitus without complications: Secondary | ICD-10-CM | POA: Diagnosis not present

## 2021-09-05 DIAGNOSIS — Z0000 Encounter for general adult medical examination without abnormal findings: Secondary | ICD-10-CM | POA: Diagnosis not present

## 2021-09-09 DIAGNOSIS — I495 Sick sinus syndrome: Secondary | ICD-10-CM | POA: Diagnosis not present

## 2021-09-10 DIAGNOSIS — I10 Essential (primary) hypertension: Secondary | ICD-10-CM | POA: Diagnosis not present

## 2021-09-10 DIAGNOSIS — G629 Polyneuropathy, unspecified: Secondary | ICD-10-CM | POA: Diagnosis not present

## 2021-09-10 DIAGNOSIS — I25810 Atherosclerosis of coronary artery bypass graft(s) without angina pectoris: Secondary | ICD-10-CM | POA: Diagnosis not present

## 2021-09-10 DIAGNOSIS — R200 Anesthesia of skin: Secondary | ICD-10-CM | POA: Diagnosis not present

## 2021-09-10 DIAGNOSIS — I5022 Chronic systolic (congestive) heart failure: Secondary | ICD-10-CM | POA: Diagnosis not present

## 2021-09-10 DIAGNOSIS — M79605 Pain in left leg: Secondary | ICD-10-CM | POA: Diagnosis not present

## 2021-09-10 DIAGNOSIS — I495 Sick sinus syndrome: Secondary | ICD-10-CM | POA: Diagnosis not present

## 2021-09-10 DIAGNOSIS — I482 Chronic atrial fibrillation, unspecified: Secondary | ICD-10-CM | POA: Diagnosis not present

## 2021-09-10 DIAGNOSIS — R202 Paresthesia of skin: Secondary | ICD-10-CM | POA: Diagnosis not present

## 2021-09-10 DIAGNOSIS — R278 Other lack of coordination: Secondary | ICD-10-CM | POA: Diagnosis not present

## 2021-09-10 DIAGNOSIS — M79604 Pain in right leg: Secondary | ICD-10-CM | POA: Diagnosis not present

## 2021-09-10 DIAGNOSIS — E782 Mixed hyperlipidemia: Secondary | ICD-10-CM | POA: Diagnosis not present

## 2023-08-18 ENCOUNTER — Other Ambulatory Visit: Payer: Self-pay

## 2023-08-18 ENCOUNTER — Emergency Department: Payer: Medicare PPO

## 2023-08-18 ENCOUNTER — Emergency Department: Payer: Medicare HMO

## 2023-08-18 DIAGNOSIS — J449 Chronic obstructive pulmonary disease, unspecified: Secondary | ICD-10-CM | POA: Diagnosis not present

## 2023-08-18 DIAGNOSIS — I2510 Atherosclerotic heart disease of native coronary artery without angina pectoris: Secondary | ICD-10-CM | POA: Diagnosis not present

## 2023-08-18 DIAGNOSIS — I110 Hypertensive heart disease with heart failure: Secondary | ICD-10-CM | POA: Diagnosis not present

## 2023-08-18 DIAGNOSIS — Z950 Presence of cardiac pacemaker: Secondary | ICD-10-CM | POA: Diagnosis not present

## 2023-08-18 DIAGNOSIS — R55 Syncope and collapse: Principal | ICD-10-CM | POA: Insufficient documentation

## 2023-08-18 DIAGNOSIS — Z7901 Long term (current) use of anticoagulants: Secondary | ICD-10-CM | POA: Diagnosis not present

## 2023-08-18 DIAGNOSIS — I4891 Unspecified atrial fibrillation: Secondary | ICD-10-CM | POA: Insufficient documentation

## 2023-08-18 DIAGNOSIS — N179 Acute kidney failure, unspecified: Secondary | ICD-10-CM | POA: Diagnosis not present

## 2023-08-18 DIAGNOSIS — Z8673 Personal history of transient ischemic attack (TIA), and cerebral infarction without residual deficits: Secondary | ICD-10-CM | POA: Insufficient documentation

## 2023-08-18 DIAGNOSIS — E119 Type 2 diabetes mellitus without complications: Secondary | ICD-10-CM | POA: Diagnosis not present

## 2023-08-18 DIAGNOSIS — R0902 Hypoxemia: Secondary | ICD-10-CM | POA: Insufficient documentation

## 2023-08-18 DIAGNOSIS — I5022 Chronic systolic (congestive) heart failure: Secondary | ICD-10-CM | POA: Diagnosis not present

## 2023-08-18 DIAGNOSIS — Z951 Presence of aortocoronary bypass graft: Secondary | ICD-10-CM | POA: Diagnosis not present

## 2023-08-18 DIAGNOSIS — Z79899 Other long term (current) drug therapy: Secondary | ICD-10-CM | POA: Insufficient documentation

## 2023-08-18 DIAGNOSIS — R531 Weakness: Secondary | ICD-10-CM | POA: Insufficient documentation

## 2023-08-18 LAB — CBC
HCT: 45 % (ref 39.0–52.0)
Hemoglobin: 14.7 g/dL (ref 13.0–17.0)
MCH: 31.7 pg (ref 26.0–34.0)
MCHC: 32.7 g/dL (ref 30.0–36.0)
MCV: 97 fL (ref 80.0–100.0)
Platelets: 170 10*3/uL (ref 150–400)
RBC: 4.64 MIL/uL (ref 4.22–5.81)
RDW: 16 % — ABNORMAL HIGH (ref 11.5–15.5)
WBC: 6.3 10*3/uL (ref 4.0–10.5)
nRBC: 0 % (ref 0.0–0.2)

## 2023-08-18 LAB — BASIC METABOLIC PANEL
Anion gap: 9 (ref 5–15)
BUN: 34 mg/dL — ABNORMAL HIGH (ref 8–23)
CO2: 29 mmol/L (ref 22–32)
Calcium: 9.2 mg/dL (ref 8.9–10.3)
Chloride: 100 mmol/L (ref 98–111)
Creatinine, Ser: 1.29 mg/dL — ABNORMAL HIGH (ref 0.61–1.24)
GFR, Estimated: 53 mL/min — ABNORMAL LOW (ref >60)
Glucose, Bld: 126 mg/dL — ABNORMAL HIGH (ref 70–99)
Potassium: 3.8 mmol/L (ref 3.5–5.1)
Sodium: 138 mmol/L (ref 135–145)

## 2023-08-18 LAB — TROPONIN I (HIGH SENSITIVITY)
Troponin I (High Sensitivity): 18 ng/L — ABNORMAL HIGH (ref <18)
Troponin I (High Sensitivity): 18 ng/L — ABNORMAL HIGH (ref <18)

## 2023-08-18 NOTE — ED Notes (Signed)
First Nurse Note: Pt to ED via [facility] from home. Pt had syncopal episode. Pt wife and daughter were able to catch him so he did not hit his head. Pt has laceration to the right hand and right arm.  Access: 18 G LAC  BP:135/80 120/80- sitting Paced rhythm on EKG CO2: 39 RR- 16 HR- 74 Last BP 139/83

## 2023-08-18 NOTE — ED Triage Notes (Signed)
See first nurse note. Pt reports fall today, unsure what happened. Unsure if had syncopal episode, wife states unsure, pt reports he does not remember what happened. Laceration to right hand with bleeding controlled. Denies pain. Pt on 2L [place] with ems, states does not wear O2 at home. 92% on RA, placed back on [place] 2 L

## 2023-08-19 ENCOUNTER — Encounter: Payer: Self-pay | Admitting: Internal Medicine

## 2023-08-19 ENCOUNTER — Observation Stay
Admission: EM | Admit: 2023-08-19 | Discharge: 2023-08-21 | Disposition: A | Discharge status: Home / Self Care | Payer: Medicare PPO | Attending: Internal Medicine | Admitting: Internal Medicine

## 2023-08-19 ENCOUNTER — Observation Stay
Admit: 2023-08-19 | Discharge: 2023-08-19 | Disposition: A | Discharge status: Home / Self Care | Payer: Medicare PPO | Attending: Internal Medicine | Admitting: Internal Medicine

## 2023-08-19 DIAGNOSIS — Z8673 Personal history of transient ischemic attack (TIA), and cerebral infarction without residual deficits: Secondary | ICD-10-CM

## 2023-08-19 DIAGNOSIS — W19XXXA Unspecified fall, initial encounter: Principal | ICD-10-CM

## 2023-08-19 DIAGNOSIS — R791 Abnormal coagulation profile: Secondary | ICD-10-CM | POA: Insufficient documentation

## 2023-08-19 DIAGNOSIS — R531 Weakness: Secondary | ICD-10-CM | POA: Diagnosis not present

## 2023-08-19 DIAGNOSIS — E118 Type 2 diabetes mellitus with unspecified complications: Secondary | ICD-10-CM | POA: Diagnosis present

## 2023-08-19 DIAGNOSIS — Z950 Presence of cardiac pacemaker: Secondary | ICD-10-CM

## 2023-08-19 DIAGNOSIS — I2510 Atherosclerotic heart disease of native coronary artery without angina pectoris: Secondary | ICD-10-CM

## 2023-08-19 DIAGNOSIS — Z951 Presence of aortocoronary bypass graft: Secondary | ICD-10-CM

## 2023-08-19 DIAGNOSIS — I5022 Chronic systolic (congestive) heart failure: Secondary | ICD-10-CM | POA: Diagnosis present

## 2023-08-19 DIAGNOSIS — R0902 Hypoxemia: Secondary | ICD-10-CM

## 2023-08-19 DIAGNOSIS — N179 Acute kidney failure, unspecified: Secondary | ICD-10-CM

## 2023-08-19 DIAGNOSIS — Z7901 Long term (current) use of anticoagulants: Secondary | ICD-10-CM

## 2023-08-19 DIAGNOSIS — I739 Peripheral vascular disease, unspecified: Secondary | ICD-10-CM | POA: Diagnosis present

## 2023-08-19 DIAGNOSIS — Z515 Encounter for palliative care: Secondary | ICD-10-CM

## 2023-08-19 DIAGNOSIS — R55 Syncope and collapse: Secondary | ICD-10-CM | POA: Diagnosis present

## 2023-08-19 DIAGNOSIS — T148XXA Other injury of unspecified body region, initial encounter: Secondary | ICD-10-CM

## 2023-08-19 DIAGNOSIS — I340 Nonrheumatic mitral (valve) insufficiency: Secondary | ICD-10-CM | POA: Diagnosis present

## 2023-08-19 DIAGNOSIS — I495 Sick sinus syndrome: Secondary | ICD-10-CM | POA: Diagnosis present

## 2023-08-19 DIAGNOSIS — I071 Rheumatic tricuspid insufficiency: Secondary | ICD-10-CM | POA: Diagnosis present

## 2023-08-19 LAB — HEMOGLOBIN A1C
Hgb A1c MFr Bld: 6.5 % — ABNORMAL HIGH (ref 4.8–5.6)
Mean Plasma Glucose: 139.85 mg/dL

## 2023-08-19 LAB — RESP PANEL BY RT-PCR (RSV, FLU A&B, COVID)  RVPGX2
Influenza A by PCR: NEGATIVE
Influenza B by PCR: NEGATIVE
Resp Syncytial Virus by PCR: NEGATIVE
SARS Coronavirus 2 by RT PCR: NEGATIVE

## 2023-08-19 LAB — GLUCOSE, CAPILLARY: Glucose-Capillary: 157 mg/dL — ABNORMAL HIGH (ref 70–99)

## 2023-08-19 LAB — CBG MONITORING, ED
Glucose-Capillary: 101 mg/dL — ABNORMAL HIGH (ref 70–99)
Glucose-Capillary: 119 mg/dL — ABNORMAL HIGH (ref 70–99)
Glucose-Capillary: 125 mg/dL — ABNORMAL HIGH (ref 70–99)
Glucose-Capillary: 109 mg/dL — ABNORMAL HIGH (ref 70–99)
Glucose-Capillary: 114 mg/dL — ABNORMAL HIGH (ref 70–99)

## 2023-08-19 LAB — PROTIME-INR
INR: 1.2 (ref 0.8–1.2)
Prothrombin Time: 15.4 s — ABNORMAL HIGH (ref 11.4–15.2)

## 2023-08-19 LAB — D-DIMER, QUANTITATIVE: D-Dimer, Quant: 1.57 ug{FEU}/mL — ABNORMAL HIGH (ref 0.00–0.50)

## 2023-08-19 MED ORDER — WARFARIN SODIUM 6 MG PO TABS
6.0000 mg | ORAL_TABLET | ORAL | Status: AC
  Administered 2023-08-19: 6 mg via ORAL
  Filled 2023-08-19: qty 1

## 2023-08-19 MED ORDER — ACETAMINOPHEN 325 MG PO TABS: 650.0000 mg | ORAL_TABLET | Freq: Four times a day (QID) | ORAL | Status: DC | PRN

## 2023-08-19 MED ORDER — SODIUM CHLORIDE 0.9 % IV BOLUS
500.0000 mL | Freq: Once | INTRAVENOUS | Status: AC
  Administered 2023-08-19: 500 mL via INTRAVENOUS

## 2023-08-19 MED ORDER — LISINOPRIL 5 MG PO TABS
5.0000 mg | ORAL_TABLET | Freq: Every day | ORAL | Status: DC
  Administered 2023-08-19 – 2023-08-21 (×3): 5 mg via ORAL
  Filled 2023-08-19 (×3): qty 1

## 2023-08-19 MED ORDER — ONDANSETRON HCL 4 MG PO TABS: 4.0000 mg | ORAL_TABLET | Freq: Four times a day (QID) | ORAL | Status: DC | PRN

## 2023-08-19 MED ORDER — INSULIN ASPART 100 UNIT/ML IJ SOLN: 0.0000 [IU] | Freq: Every day | INTRAMUSCULAR | Status: DC

## 2023-08-19 MED ORDER — METOPROLOL SUCCINATE ER 50 MG PO TB24
50.0000 mg | ORAL_TABLET | Freq: Every day | ORAL | Status: DC
  Administered 2023-08-20 – 2023-08-21 (×2): 50 mg via ORAL
  Filled 2023-08-19 (×2): qty 1

## 2023-08-19 MED ORDER — ATORVASTATIN CALCIUM 10 MG PO TABS
10.0000 mg | ORAL_TABLET | Freq: Every day | ORAL | Status: DC
  Administered 2023-08-19 – 2023-08-21 (×3): 10 mg via ORAL
  Filled 2023-08-19 (×3): qty 1

## 2023-08-19 MED ORDER — SODIUM CHLORIDE 0.9% FLUSH
3.0000 mL | Freq: Two times a day (BID) | INTRAVENOUS | Status: DC
  Administered 2023-08-19 – 2023-08-21 (×3): 3 mL via INTRAVENOUS

## 2023-08-19 MED ORDER — METOPROLOL SUCCINATE ER 25 MG PO TB24
25.0000 mg | ORAL_TABLET | Freq: Every day | ORAL | Status: DC
  Administered 2023-08-19: 25 mg via ORAL
  Filled 2023-08-19: qty 1

## 2023-08-19 MED ORDER — LEVOTHYROXINE SODIUM 25 MCG PO TABS
25.0000 ug | ORAL_TABLET | Freq: Every day | ORAL | Status: DC
  Administered 2023-08-19 – 2023-08-21 (×3): 25 ug via ORAL
  Filled 2023-08-19 (×3): qty 1

## 2023-08-19 MED ORDER — INSULIN ASPART 100 UNIT/ML IJ SOLN
0.0000 [IU] | Freq: Three times a day (TID) | INTRAMUSCULAR | Status: DC
  Administered 2023-08-19 – 2023-08-20 (×2): 2 [IU] via SUBCUTANEOUS
  Filled 2023-08-19 (×2): qty 1

## 2023-08-19 MED ORDER — ONDANSETRON HCL 4 MG/2ML IJ SOLN: 4.0000 mg | Freq: Four times a day (QID) | INTRAMUSCULAR | Status: DC | PRN

## 2023-08-19 MED ORDER — DAPAGLIFLOZIN PROPANEDIOL 5 MG PO TABS
5.0000 mg | ORAL_TABLET | Freq: Every day | ORAL | Status: DC
  Administered 2023-08-19 – 2023-08-20 (×2): 5 mg via ORAL
  Filled 2023-08-19 (×2): qty 1

## 2023-08-19 MED ORDER — ACETAMINOPHEN 650 MG RE SUPP: 650.0000 mg | Freq: Four times a day (QID) | RECTAL | Status: DC | PRN

## 2023-08-19 MED ORDER — ENOXAPARIN SODIUM 40 MG/0.4ML IJ SOSY
40.0000 mg | PREFILLED_SYRINGE | INTRAMUSCULAR | Status: DC
  Administered 2023-08-19 – 2023-08-20 (×2): 40 mg via SUBCUTANEOUS
  Filled 2023-08-19 (×2): qty 0.4

## 2023-08-19 MED ORDER — IPRATROPIUM-ALBUTEROL 0.5-2.5 (3) MG/3ML IN SOLN
3.0000 mL | Freq: Once | RESPIRATORY_TRACT | Status: AC
  Administered 2023-08-19: 3 mL via RESPIRATORY_TRACT
  Filled 2023-08-19: qty 3

## 2023-08-19 MED ORDER — WARFARIN - PHARMACIST DOSING INPATIENT
Freq: Every day | Status: DC
  Filled 2023-08-19: qty 1

## 2023-08-19 NOTE — Assessment & Plan Note (Signed)
Sliding scale insulin coverage 

## 2023-08-19 NOTE — Consult Note (Signed)
[facility] CARDIOLOGY CONSULT NOTE       Patient ID: Bryan Singleton MRN: [id number] DOB/AGE: 05-Aug-1932 89 y.o.  Admit date: 08/19/2023 Referring Physician Dr. Delfino Lovett Primary Physician Lynnea Ferrier, MD  Primary Cardiologist Dr. Dorothyann Peng Reason for Consultation syncope  HPI: Bryan Singleton is a 89 y.o. male  with a past medical history of coronary artery disease s/p CABG x 5, sick sinus syndrome s/p PPM, with generator change 05/2020, atrial fibrillation on Eliquis, ischemic cardiomyopathy, chronic HFrEF, hypertension, hyperlipidemia, history of TIA who presented to the ED on 08/19/2023 for syncope and fall. Cardiology was consulted for further evaluation.   Orts that late last night he was walking from his bedroom to his kitchen when he passed out.  His wife was present for this episode.  Patient states that he cannot recall passing out he just knows that he woke up on the floor.  Wife reports that loss of consciousness lasted only a few seconds.  He states that prior to his syncopal episode he did not have any dizziness or lightheadedness, chest pain or shortness of breath, palpitations.  States that he felt okay when he woke up, did not have any symptoms.  He does not believe he hit his head but did hit his arm on a piece of furniture.  He was brought to the ED for further evaluation after this episode.  At the time of my evaluation this morning patient is resting comfortably in ED stretcher with wife present at bedside.  Discussed his side in detail.  He denies any recent episodes of chest pain, shortness of breath, palpitations.  Denies any other prior episodes of syncope.  States that he feels fine now other than some right arm weakness.  Overall feels weak and unsteady whenever he tries to stand and move around.  He denies any visual changes, slurred speech, facial droop.  Review of systems complete and found to be negative unless listed above    Past Medical  History:  Diagnosis Date   COPD (chronic obstructive pulmonary disease) (HCC)    Coronary artery disease    Hypertension    Presence of permanent cardiac pacemaker     Past Surgical History:  Procedure Laterality Date   PACEMAKER INSERTION N/A 06/04/2020   Procedure: GENERATOR Change out;  Surgeon: Marcina Millard, MD;  Location: [facility] ORS;  Service: Cardiovascular;  Laterality: N/A;    (Not in a hospital admission)  Social History   Socioeconomic History   Marital status: Married    Spouse name: Not on file   Number of children: Not on file   Years of education: Not on file   Highest education level: Not on file  Occupational History   Not on file  Tobacco Use   Smoking status: Not on file   Smokeless tobacco: Not on file  Substance and Sexual Activity   Alcohol use: Not on file   Drug use: Not on file   Sexual activity: Not on file  Other Topics Concern   Not on file  Social History Narrative   Not on file   Social Determinants of Health   Financial Resource Strain: Low Risk  (08/16/2023)   Received from [facility]   Overall Financial Resource Strain (CARDIA)    Difficulty of Paying Living Expenses: Not hard at all  Food Insecurity: No Food Insecurity (08/16/2023)   Received from [facility]   Hunger Vital Sign    Worried  About Running Out of Food in the Last Year: Never true    Ran Out of Food in the Last Year: Never true  Transportation Needs: No Transportation Needs (08/16/2023)   Received from [facility] - Transportation    In the past 12 months, has lack of transportation kept you from medical appointments or from getting medications?: No    Lack of Transportation (Non-Medical): No  Physical Activity: Not on file  Stress: Not on file  Social Connections: Not on file  Intimate Partner Violence: Not on file    History reviewed. No pertinent family history.   Vitals:   08/19/23 1000  08/19/23 1200 08/19/23 1300 08/19/23 1459  BP: (!) 139/97 132/79 127/67   Pulse: 78 78 74   Resp: 16 18 13    Temp:    98.1 F (36.7 C)  TempSrc:    Oral  SpO2: 96% 94% 96%   Weight:      Height:        PHYSICAL EXAM General: [place] ill-appearing elderly male, well nourished, in no acute distress sitting upright in hospital bed with wife present at bedside. HEENT: Normocephalic and atraumatic. Neck: No JVD.  Lungs: Normal respiratory effort on room air. Clear bilaterally to auscultation. No wheezes, crackles, rhonchi=.  Heart: HRRR. Normal S1 and S2 without gallops or murmurs.  Abdomen: Non-distended appearing.  Msk: Normal strength and tone for age. Extremities: Warm and well perfused. No clubbing, cyanosis.  No edema.  Neuro: Alert and oriented X 3. Psych: Answers questions appropriately.   Labs: Basic Metabolic Panel: Recent Labs    08/18/23 1741  NA 138  K 3.8  CL 100  CO2 29  GLUCOSE 126*  BUN 34*  CREATININE 1.29*  CALCIUM 9.2   Liver Function Tests: No results for input(s): "AST", "ALT", "ALKPHOS", "BILITOT", "PROT", "ALBUMIN" in the last 72 hours. No results for input(s): "LIPASE", "AMYLASE" in the last 72 hours. CBC: Recent Labs    08/18/23 1741  WBC 6.3  HGB 14.7  HCT 45.0  MCV 97.0  PLT 170   Cardiac Enzymes: Recent Labs    08/18/23 1741 08/18/23 2025  TROPONINIHS 18* 18*   BNP: No results for input(s): "BNP" in the last 72 hours. D-Dimer: Recent Labs    08/19/23 0539  DDIMER 1.57*   Hemoglobin A1C: Recent Labs    08/19/23 0539  HGBA1C 6.5*   Fasting Lipid Panel: No results for input(s): "CHOL", "HDL", "LDLCALC", "TRIG", "CHOLHDL", "LDLDIRECT" in the last 72 hours. Thyroid Function Tests: No results for input(s): "TSH", "T4TOTAL", "T3FREE", "THYROIDAB" in the last 72 hours.  Invalid input(s): "FREET3" Anemia Panel: No results for input(s): "VITAMINB12", "FOLATE", "FERRITIN", "TIBC", "IRON", "RETICCTPCT" in the last 72 hours.    Radiology: CT Head Wo Contrast  Result Date: 08/18/2023 CLINICAL DATA:  Syncope. EXAM: CT HEAD WITHOUT CONTRAST CT CERVICAL SPINE WITHOUT CONTRAST TECHNIQUE: Multidetector CT imaging of the head and cervical spine was performed following the standard protocol without intravenous contrast. Multiplanar CT image reconstructions of the cervical spine were also generated. RADIATION DOSE REDUCTION: This exam was performed according to the departmental dose-optimization program which includes automated exposure control, adjustment of the mA and/or kV according to patient size and/or use of iterative reconstruction technique. COMPARISON:  Head CT dated 02/24/2010. FINDINGS: CT HEAD FINDINGS Brain: Mild age-related atrophy and chronic microvascular ischemic changes. There is no acute intracranial hemorrhage. No mass effect or midline shift. No extra-axial fluid collection. Vascular: No hyperdense vessel or  unexpected calcification. Skull: Normal. Negative for fracture or focal lesion. Sinuses/Orbits: No acute finding. Other: None CT CERVICAL SPINE FINDINGS Alignment: No acute subluxation. Skull base and vertebrae: No acute fracture.  Osteopenia. Soft tissues and spinal canal: No prevertebral fluid or swelling. No visible canal hematoma. Disc levels:  No acute findings.  Degenerative changes. Upper chest: Negative. Other: Bilateral carotid bulb calcified plaques. Partially visualized left pacemaker device. IMPRESSION: 1. No acute intracranial pathology. Mild age-related atrophy and chronic microvascular ischemic changes. 2. No acute/traumatic cervical spine pathology. Electronically Signed   By: Elgie Collard M.D.   On: 08/18/2023 22:34   CT Cervical Spine Wo Contrast  Result Date: 08/18/2023 CLINICAL DATA:  Syncope. EXAM: CT HEAD WITHOUT CONTRAST CT CERVICAL SPINE WITHOUT CONTRAST TECHNIQUE: Multidetector CT imaging of the head and cervical spine was performed following the standard protocol without intravenous  contrast. Multiplanar CT image reconstructions of the cervical spine were also generated. RADIATION DOSE REDUCTION: This exam was performed according to the departmental dose-optimization program which includes automated exposure control, adjustment of the mA and/or kV according to patient size and/or use of iterative reconstruction technique. COMPARISON:  Head CT dated 02/24/2010. FINDINGS: CT HEAD FINDINGS Brain: Mild age-related atrophy and chronic microvascular ischemic changes. There is no acute intracranial hemorrhage. No mass effect or midline shift. No extra-axial fluid collection. Vascular: No hyperdense vessel or unexpected calcification. Skull: Normal. Negative for fracture or focal lesion. Sinuses/Orbits: No acute finding. Other: None CT CERVICAL SPINE FINDINGS Alignment: No acute subluxation. Skull base and vertebrae: No acute fracture.  Osteopenia. Soft tissues and spinal canal: No prevertebral fluid or swelling. No visible canal hematoma. Disc levels:  No acute findings.  Degenerative changes. Upper chest: Negative. Other: Bilateral carotid bulb calcified plaques. Partially visualized left pacemaker device. IMPRESSION: 1. No acute intracranial pathology. Mild age-related atrophy and chronic microvascular ischemic changes. 2. No acute/traumatic cervical spine pathology. Electronically Signed   By: Elgie Collard M.D.   On: 08/18/2023 22:34   DG Chest 2 View  Result Date: 08/18/2023 CLINICAL DATA:  Hypoxia, fall. EXAM: CHEST - 2 VIEW COMPARISON:  March 28, 2009. FINDINGS: Stable cardiomediastinal silhouette. Status post coronary artery bypass graft. Left-sided pacemaker is unchanged. Lungs are clear. Bony thorax is unremarkable. IMPRESSION: No active cardiopulmonary disease. Electronically Signed   By: Lupita Raider M.D.   On: 08/18/2023 20:33    ECHO pending  TELEMETRY reviewed by me 08/19/2023: Ventricular pacing with PVCs rate 70s  EKG reviewed by me: Regular paced rhythm with PVCs rate  77 bpm  Data reviewed by me 08/19/2023: last 24h vitals tele labs imaging I/O ED provider note, admission H&P  Principal Problem:   Syncope and collapse Active Problems:   AKI (acute kidney injury) (HCC)   Generalized weakness   Long term (current) use of warfarin   History of TIAs   SSS (sick sinus syndrome) (HCC)   Severe tricuspid valve insufficiency   Peripheral vascular disease (HCC)   Moderate mitral insufficiency   Controlled type 2 diabetes mellitus with complication, without long-term current use of insulin (HCC)   Chronic systolic CHF (congestive heart failure), NYHA class 2 (HCC)   Presence of permanent cardiac pacemaker   Hx of CABG x 5   Coronary artery disease s/p CABG x 5   Subtherapeutic international normalized ratio (INR)    ASSESSMENT AND PLAN:  Bryan Singleton is a 89 y.o. male  with a past medical history of coronary artery disease s/p CABG x 5, sick sinus  syndrome s/p PPM, with generator change 05/2020, atrial fibrillation on warfarin, ischemic cardiomyopathy, chronic HFrEF, hypertension, hyperlipidemia, history of TIA who presented to the ED on 08/19/2023 for syncope and fall. Cardiology was consulted for further evaluation.   # Syncope # CAD s/p CABG x5 # SSS s/p PPM # Atrial fibrillation Presented to the ED after episode of syncope yesterday evening.  No prodromal symptoms, loss of consciousness only a few seconds.  Episode witnessed by wife.  EKG with ventricular paced rhythm and PVCs.  CT head without acute abnormality.  Troponins mildly elevated and flat at 18 > 18. -Device interrogation today with no recent events to explain episode of syncope.  No identifiable cardiac cause of syncope. -Would recommend considering MRI brain but unable to complete this here at [facility] given he has pacemaker. -INR subtherapeutic, warfarin dosing per pharmacy. -Plan for further cardiac diagnostics at this time.  # Generalized weakness # AKI Patient and wife reporting  weakness since last night, patient reportedly felt unsteady when he had his syncopal episode.  Now reporting that his right arm feels "floppy".  Creatinine 1.29 on admission up from baseline around 1. -Continue to monitor symptoms. -Continue to monitor renal function closely.   This patient's plan of care was discussed and created with Dr. Darrold Junker and he is in agreement.  Signed: Gale Journey, PA-C  08/19/2023, 4:00 PM [facility]

## 2023-08-19 NOTE — Assessment & Plan Note (Signed)
PVD Continue metoprolol, atorvastatin, lisinopril, warfarin

## 2023-08-19 NOTE — Assessment & Plan Note (Signed)
S/p pacemaker 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 Cardiology consult

## 2023-08-19 NOTE — Final Progress Note (Signed)
Same day rounding progress note  Patient seen and examined in the ED.  I agree with assessment and plan dictated by Dr. Para March please see her dictated history and physical for further details.  Syncope CAD status post CABG Sick sinus syndrome status post pacemaker A-fib Cardiology consult Pacemaker interrogated -no events to explain the episode of syncope CT head negative Unable to obtain MRI here at [facility] due to pacemaker  Generalized weakness PT and OT evaluation  Goals of care Will consult palliative care  Time spent: 35 minutes

## 2023-08-19 NOTE — ED Notes (Signed)
Pt admitted to [facility]

## 2023-08-19 NOTE — Assessment & Plan Note (Signed)
Peripheral vascular disease Continue Coumadin and statin

## 2023-08-19 NOTE — ED Notes (Signed)
Assisted pt to use urinal. Pt was able to stand up on the side of the bed with a walker and stand by assist.

## 2023-08-19 NOTE — H&P (Signed)
History and Physical    Patient: Bryan Singleton LOV:[id number] DOB: 12-07-1931 DOA: 08/19/2023 DOS: the patient was seen and examined on 08/19/2023 PCP: Lynnea Ferrier, MD  Patient coming from: Home  Chief Complaint:  Chief Complaint  Patient presents with   Fall    HPI: Bryan Singleton is a 89 y.o. male with medical history significant for Multiple medical problems being admitted with a syncopal episode resulting in a fall sustaining an abrasion to the right forearm. Patient has an extensive past medical history including CAD s/p CABG x 5, CHF, valvulopathy, SSS s/p pacemaker, PVD, TIAs on Coumadin .  Patient states he was in his usual state of health when he got up to go to the bathroom when he felt dizzy and fell to the floor hitting his right side against some furniture.  His wife rushed to his assistance by which time he had started to come around.  At the present time he complains only of weakness.  Denies chest pain, shortness of breath, palpitations, headache.  He did sustain an abrasion to the right forearm. ED course and data review: Vitals mostly within normal limits except for occasional bradycardia down to 50.   Labs: CBC and BMP notable only for creatinine of 1.29 up from baseline of 1.0 Troponin 18 Respiratory viral panel negative for COVID flu and RSV EKG, personally viewed and interpreted showing ventricular paced rhythm at 77 CT head and C-spine with no traumatic injury Chest x-ray no active disease Patient was treated with DuoNebs as his chest exam was noted as being diminished.  He was also given a fluid bolus Hospitalist consulted for admission for syncope workup   Review of Systems: As mentioned in the history of present illness. All other systems reviewed and are negative.  Past Medical History:  Diagnosis Date   COPD (chronic obstructive pulmonary disease) (HCC)    Coronary artery disease    Hypertension    Presence of permanent cardiac pacemaker     Past Surgical History:  Procedure Laterality Date   PACEMAKER INSERTION N/A 06/04/2020   Procedure: GENERATOR Change out;  Surgeon: Marcina Millard, MD;  Location: [facility] ORS;  Service: Cardiovascular;  Laterality: N/A;   Social History:  has no history on file for tobacco use, alcohol use, and drug use.  Allergies  Allergen Reactions   Allegra Allergy [Fexofenadine Hcl]    Penicillins    Prednisone    Shellfish Allergy     History reviewed. No pertinent family history.  Prior to Admission medications   Medication Sig Start Date End Date Taking? Authorizing Provider  atorvastatin (LIPITOR) 10 MG tablet Take 10 mg by mouth daily.   Yes [provider]  camphor-menthol Wynelle Fanny) lotion Apply 1 Application topically as needed. 03/17/22  Yes [provider]  dapagliflozin propanediol (FARXIGA) 10 MG TABS tablet Take 5 mg by mouth daily. 03/10/22  Yes [provider]  furosemide (LASIX) 40 MG tablet Take 40 mg by mouth.   Yes [provider]  gabapentin (NEURONTIN) 100 MG capsule Take 100 mg by mouth 3 (three) times daily.   Yes [provider]  imiquimod (ALDARA) 5 % cream Apply 1 Application topically at bedtime. 06/28/23  Yes [provider]  levothyroxine (SYNTHROID) 25 MCG tablet Take 25 mcg by mouth daily before breakfast.   Yes [provider]  lisinopril (ZESTRIL) 5 MG tablet Take 5 mg by mouth daily.   Yes [provider]  metoprolol succinate (TOPROL-XL) 25 MG  24 hr tablet Take 25 mg by mouth daily.   Yes [provider]  warfarin (COUMADIN) 4 MG tablet Take 4 mg by mouth daily.   Yes [provider]    Physical Exam: Vitals:   08/19/23 0200 08/19/23 0230 08/19/23 0245 08/19/23 0300  BP:    125/70  Pulse: 63 77 73 78  Resp: 14 17 17 20   Temp:      TempSrc:      SpO2: 96% 95% 95% 97%  Weight:      Height:       Physical Exam Vitals and nursing note reviewed.  Constitutional:       General: He is not in acute distress. HENT:     Head: Normocephalic and atraumatic.  Cardiovascular:     Rate and Rhythm: Normal rate and regular rhythm.     Heart sounds: Normal heart sounds.  Pulmonary:     Effort: Pulmonary effort is normal.     Breath sounds: Normal breath sounds.  Abdominal:     Palpations: Abdomen is soft.     Tenderness: There is no abdominal tenderness.  Neurological:     Mental Status: Mental status is at baseline.     Labs on Admission: I have personally reviewed following labs and imaging studies  CBC: Recent Labs  Lab 08/18/23 1741  WBC 6.3  HGB 14.7  HCT 45.0  MCV 97.0  PLT 170   Basic Metabolic Panel: Recent Labs  Lab 08/18/23 1741  NA 138  K 3.8  CL 100  CO2 29  GLUCOSE 126*  BUN 34*  CREATININE 1.29*  CALCIUM 9.2   GFR: Estimated Creatinine Clearance: 44.8 mL/min (A) (by C-G formula based on SCr of 1.29 mg/dL (H)). Liver Function Tests: No results for input(s): "AST", "ALT", "ALKPHOS", "BILITOT", "PROT", "ALBUMIN" in the last 168 hours. No results for input(s): "LIPASE", "AMYLASE" in the last 168 hours. No results for input(s): "AMMONIA" in the last 168 hours. Coagulation Profile: Recent Labs  Lab 08/19/23 0052  INR 1.2   Cardiac Enzymes: No results for input(s): "CKTOTAL", "CKMB", "CKMBINDEX", "TROPONINI" in the last 168 hours. BNP (last 3 results) No results for input(s): "PROBNP" in the last 8760 hours. HbA1C: No results for input(s): "HGBA1C" in the last 72 hours. CBG: No results for input(s): "GLUCAP" in the last 168 hours. Lipid Profile: No results for input(s): "CHOL", "HDL", "LDLCALC", "TRIG", "CHOLHDL", "LDLDIRECT" in the last 72 hours. Thyroid Function Tests: No results for input(s): "TSH", "T4TOTAL", "FREET4", "T3FREE", "THYROIDAB" in the last 72 hours. Anemia Panel: No results for input(s): "VITAMINB12", "FOLATE", "FERRITIN", "TIBC", "IRON", "RETICCTPCT" in the last 72 hours. Urine analysis: No results  found for: "COLORURINE", "APPEARANCEUR", "LABSPEC", "PHURINE", "GLUCOSEU", "HGBUR", "BILIRUBINUR", "KETONESUR", "PROTEINUR", "UROBILINOGEN", "NITRITE", "LEUKOCYTESUR"  Radiological Exams on Admission: CT Head Wo Contrast  Result Date: 08/18/2023 CLINICAL DATA:  Syncope. EXAM: CT HEAD WITHOUT CONTRAST CT CERVICAL SPINE WITHOUT CONTRAST TECHNIQUE: Multidetector CT imaging of the head and cervical spine was performed following the standard protocol without intravenous contrast. Multiplanar CT image reconstructions of the cervical spine were also generated. RADIATION DOSE REDUCTION: This exam was performed according to the departmental dose-optimization program which includes automated exposure control, adjustment of the mA and/or kV according to patient size and/or use of iterative reconstruction technique. COMPARISON:  Head CT dated 02/24/2010. FINDINGS: CT HEAD FINDINGS Brain: Mild age-related atrophy and chronic microvascular ischemic changes. There is no acute intracranial hemorrhage. No mass effect or midline shift. No extra-axial fluid collection. Vascular: No  hyperdense vessel or unexpected calcification. Skull: Normal. Negative for fracture or focal lesion. Sinuses/Orbits: No acute finding. Other: None CT CERVICAL SPINE FINDINGS Alignment: No acute subluxation. Skull base and vertebrae: No acute fracture.  Osteopenia. Soft tissues and spinal canal: No prevertebral fluid or swelling. No visible canal hematoma. Disc levels:  No acute findings.  Degenerative changes. Upper chest: Negative. Other: Bilateral carotid bulb calcified plaques. Partially visualized left pacemaker device. IMPRESSION: 1. No acute intracranial pathology. Mild age-related atrophy and chronic microvascular ischemic changes. 2. No acute/traumatic cervical spine pathology. Electronically Signed   By: Elgie Collard M.D.   On: 08/18/2023 22:34   CT Cervical Spine Wo Contrast  Result Date: 08/18/2023 CLINICAL DATA:  Syncope. EXAM: CT  HEAD WITHOUT CONTRAST CT CERVICAL SPINE WITHOUT CONTRAST TECHNIQUE: Multidetector CT imaging of the head and cervical spine was performed following the standard protocol without intravenous contrast. Multiplanar CT image reconstructions of the cervical spine were also generated. RADIATION DOSE REDUCTION: This exam was performed according to the departmental dose-optimization program which includes automated exposure control, adjustment of the mA and/or kV according to patient size and/or use of iterative reconstruction technique. COMPARISON:  Head CT dated 02/24/2010. FINDINGS: CT HEAD FINDINGS Brain: Mild age-related atrophy and chronic microvascular ischemic changes. There is no acute intracranial hemorrhage. No mass effect or midline shift. No extra-axial fluid collection. Vascular: No hyperdense vessel or unexpected calcification. Skull: Normal. Negative for fracture or focal lesion. Sinuses/Orbits: No acute finding. Other: None CT CERVICAL SPINE FINDINGS Alignment: No acute subluxation. Skull base and vertebrae: No acute fracture.  Osteopenia. Soft tissues and spinal canal: No prevertebral fluid or swelling. No visible canal hematoma. Disc levels:  No acute findings.  Degenerative changes. Upper chest: Negative. Other: Bilateral carotid bulb calcified plaques. Partially visualized left pacemaker device. IMPRESSION: 1. No acute intracranial pathology. Mild age-related atrophy and chronic microvascular ischemic changes. 2. No acute/traumatic cervical spine pathology. Electronically Signed   By: Elgie Collard M.D.   On: 08/18/2023 22:34   DG Chest 2 View  Result Date: 08/18/2023 CLINICAL DATA:  Hypoxia, fall. EXAM: CHEST - 2 VIEW COMPARISON:  March 28, 2009. FINDINGS: Stable cardiomediastinal silhouette. Status post coronary artery bypass graft. Left-sided pacemaker is unchanged. Lungs are clear. Bony thorax is unremarkable. IMPRESSION: No active cardiopulmonary disease. Electronically Signed   By: Lupita Raider M.D.   On: 08/18/2023 20:33     Data Reviewed: Relevant notes from primary care and specialist visits, past discharge summaries as available in EHR, including Care Everywhere. Prior diagnostic testing as pertinent to current admission diagnoses Updated medications and problem lists for reconciliation ED course, including vitals, labs, imaging, treatment and response to treatment Triage notes, nursing and pharmacy notes and ED provider's notes Notable results as noted in HPI   Assessment and Plan: * Syncope and collapse Uncertain etiology but suspecting cardiac etiology history, dehydration CT head nonacute Continuous cardiac monitoring, echocardiogram and carotid Doppler D-dimer ordered given subtherapeutic INR Will get cardiology consult, due to multiple cardiac comorbidities  Generalized weakness Possibly related to dehydration Received an IV fluid bolus PT eval  AKI (acute kidney injury) (HCC) Creatinine 1.29 up from baseline of 1.0, suspect ATN related to dehydration from diuretics Treated with a 500 cc NS bolus in the ED Continue to monitor  SSS (sick sinus syndrome) (HCC) S/p pacemaker 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 Cardiology consult  Controlled type 2 diabetes mellitus with complication, without long-term current use  of insulin (HCC) Sliding scale insulin coverage  Subtherapeutic international normalized ratio (INR) INR 1.2 No history of A-fib or DVT per record review (limited as patient also goes to the [place]) Pharmacy consult for Coumadin management  Coronary artery disease s/p CABG x 5 PVD Continue metoprolol, atorvastatin, lisinopril, warfarin  History of TIAs Peripheral vascular disease Continue Coumadin and statin        DVT prophylaxis: Lovenox until INR is therapeutic  Consults: Cardiology  Advance Care Planning: full code  Family  Communication: none  Disposition Plan: Back to previous home environment  Severity of Illness: The appropriate patient status for this patient is OBSERVATION. Observation status is judged to be reasonable and necessary in order to provide the required intensity of service to ensure the patient's safety. The patient's presenting symptoms, physical exam findings, and initial radiographic and laboratory data in the context of their medical condition is felt to place them at decreased risk for further clinical deterioration. Furthermore, it is anticipated that the patient will be medically stable for discharge from the hospital within 2 midnights of admission.   Author: Andris Baumann, MD 08/19/2023 3:39 AM  For on call review [email or web address].

## 2023-08-19 NOTE — ED Provider Notes (Signed)
[facility] Provider Note    Event Date/Time   First MD Initiated Contact with Patient 08/19/23 0014     (approximate)   History   Fall   HPI  Bryan Singleton is a 89 y.o. male brought to the ED via EMS from home with a chief complaint of syncope, fall, RUE lacerations.  Patient with a history of CAD, hypertension, COPD not on home oxygen; on warfarin who got up to go to another room in his house, became dizzy and had a syncopal episode.  Struck his right hand and elbow.  EMS reports hypoxia with room air saturation 92%, placed on 2 L nasal cannula oxygen.  Endorses generalized weakness and feeling "wobbly".  Denies fever/chills, chest pain, shortness of breath, abdominal pain, nausea or vomiting.    Past Medical History   Past Medical History:  Diagnosis Date   COPD (chronic obstructive pulmonary disease) (HCC)    Coronary artery disease    Hypertension    Presence of permanent cardiac pacemaker      Active Problem List  There are no problems to display for this patient.    Past Surgical History   Past Surgical History:  Procedure Laterality Date   PACEMAKER INSERTION N/A 06/04/2020   Procedure: GENERATOR Change out;  Surgeon: Marcina Millard, MD;  Location: [facility] ORS;  Service: Cardiovascular;  Laterality: N/A;     Home Medications   Prior to Admission medications   Medication Sig Start Date End Date Taking? Authorizing Provider  atorvastatin (LIPITOR) 10 MG tablet Take 10 mg by mouth daily.    [provider]  furosemide (LASIX) 40 MG tablet Take 40 mg by mouth.    [provider]  gabapentin (NEURONTIN) 100 MG capsule Take 100 mg by mouth 3 (three) times daily.    [provider]  levothyroxine (SYNTHROID) 25 MCG tablet Take 25 mcg by mouth daily before breakfast.    [provider]  lisinopril (ZESTRIL) 5 MG tablet Take 5 mg by mouth daily.    [provider]  metoprolol succinate  (TOPROL-XL) 25 MG 24 hr tablet Take 25 mg by mouth daily.    [provider]  warfarin (COUMADIN) 4 MG tablet Take 4 mg by mouth daily.    [provider]     Allergies  Allegra allergy [fexofenadine hcl], Penicillins, Prednisone, and Shellfish allergy   Family History  History reviewed. No pertinent family history.   Physical Exam  Triage Vital Signs: ED Triage Vitals  Encounter Vitals Group     BP 08/18/23 1725 135/72     Systolic BP Percentile --      Diastolic BP Percentile --      Pulse Rate 08/18/23 1725 62     Resp 08/18/23 1725 18     Temp 08/18/23 1725 97.8 F (36.6 C)     Temp Source 08/18/23 1725 Oral     SpO2 08/18/23 1725 92 %     Weight 08/18/23 1728 180 lb (81.6 kg)     Height 08/18/23 1728 6\' 2"  (1.88 m)     Head Circumference --      Peak Flow --      Pain Score 08/18/23 1740 0     Pain Loc --      Pain Education --      Exclude from Growth Chart --     Updated Vital Signs: BP (!) 144/86 (BP Location: Right Arm)   Pulse (!) 56  Temp 98.2 F (36.8 C) (Oral)   Resp 18   Ht 6\' 2"  (1.88 m)   Wt 81.6 kg   SpO2 99%   BMI 23.11 kg/m    General: Awake, mild distress.  Mildly dry mucous membranes. CV:  RRR.  Good peripheral perfusion.  Resp:  Normal effort.  Diminished aeration. Abd:  Nontender.  No distention.  Other:  Alert and oriented to person and place.  CN II-XII grossly intact.  5/5 motor strength and sensation all extremities.  MAE x 4.  Skin tears to dorsal right hand and elbow.  Full range of motion right hand, wrist, elbow and shoulder without pain.  2+ radial pulse.  Brisk, less than 5-second capillary refill.   ED Results / Procedures / Treatments  Labs (all labs ordered are listed, but only abnormal results are displayed) Labs Reviewed  CBC - Abnormal; Notable for the following components:      Result Value   RDW 16.0 (*)    All other components within normal limits  BASIC METABOLIC PANEL - Abnormal; Notable for  the following components:   Glucose, Bld 126 (*)    BUN 34 (*)    Creatinine, Ser 1.29 (*)    GFR, Estimated 53 (*)    All other components within normal limits  TROPONIN I (HIGH SENSITIVITY) - Abnormal; Notable for the following components:   Troponin I (High Sensitivity) 18 (*)    All other components within normal limits  TROPONIN I (HIGH SENSITIVITY) - Abnormal; Notable for the following components:   Troponin I (High Sensitivity) 18 (*)    All other components within normal limits  RESP PANEL BY RT-PCR (RSV, FLU A&B, COVID)  [id number]  PROTIME-INR     EKG  ED ECG REPORT I, Caisen Mangas J, the attending physician, personally viewed and interpreted this ECG.   Date: 08/19/2023  EKG Time: 1735  Rate: 77  Rhythm: Paced  Axis: Paced  Intervals: PVCs  ST&T Change: Paced    RADIOLOGY I have independently visualized and interpreted patient's imaging studies as well as noted the radiology interpretation:  CT head: No ICH  CT cervical spine: No acute osseous injury  Chest x-ray: No acute cardiopulmonary process  Official radiology report(s): CT Head Wo Contrast  Result Date: 08/18/2023 CLINICAL DATA:  Syncope. EXAM: CT HEAD WITHOUT CONTRAST CT CERVICAL SPINE WITHOUT CONTRAST TECHNIQUE: Multidetector CT imaging of the head and cervical spine was performed following the standard protocol without intravenous contrast. Multiplanar CT image reconstructions of the cervical spine were also generated. RADIATION DOSE REDUCTION: This exam was performed according to the departmental dose-optimization program which includes automated exposure control, adjustment of the mA and/or kV according to patient size and/or use of iterative reconstruction technique. COMPARISON:  Head CT dated 02/24/2010. FINDINGS: CT HEAD FINDINGS Brain: Mild age-related atrophy and chronic microvascular ischemic changes. There is no acute intracranial hemorrhage. No mass effect or midline shift. No extra-axial fluid  collection. Vascular: No hyperdense vessel or unexpected calcification. Skull: Normal. Negative for fracture or focal lesion. Sinuses/Orbits: No acute finding. Other: None CT CERVICAL SPINE FINDINGS Alignment: No acute subluxation. Skull base and vertebrae: No acute fracture.  Osteopenia. Soft tissues and spinal canal: No prevertebral fluid or swelling. No visible canal hematoma. Disc levels:  No acute findings.  Degenerative changes. Upper chest: Negative. Other: Bilateral carotid bulb calcified plaques. Partially visualized left pacemaker device. IMPRESSION: 1. No acute intracranial pathology. Mild age-related atrophy and chronic microvascular ischemic changes. 2. No acute/traumatic cervical spine  pathology. Electronically Signed   By: Elgie Collard M.D.   On: 08/18/2023 22:34   CT Cervical Spine Wo Contrast  Result Date: 08/18/2023 CLINICAL DATA:  Syncope. EXAM: CT HEAD WITHOUT CONTRAST CT CERVICAL SPINE WITHOUT CONTRAST TECHNIQUE: Multidetector CT imaging of the head and cervical spine was performed following the standard protocol without intravenous contrast. Multiplanar CT image reconstructions of the cervical spine were also generated. RADIATION DOSE REDUCTION: This exam was performed according to the departmental dose-optimization program which includes automated exposure control, adjustment of the mA and/or kV according to patient size and/or use of iterative reconstruction technique. COMPARISON:  Head CT dated 02/24/2010. FINDINGS: CT HEAD FINDINGS Brain: Mild age-related atrophy and chronic microvascular ischemic changes. There is no acute intracranial hemorrhage. No mass effect or midline shift. No extra-axial fluid collection. Vascular: No hyperdense vessel or unexpected calcification. Skull: Normal. Negative for fracture or focal lesion. Sinuses/Orbits: No acute finding. Other: None CT CERVICAL SPINE FINDINGS Alignment: No acute subluxation. Skull base and vertebrae: No acute fracture.   Osteopenia. Soft tissues and spinal canal: No prevertebral fluid or swelling. No visible canal hematoma. Disc levels:  No acute findings.  Degenerative changes. Upper chest: Negative. Other: Bilateral carotid bulb calcified plaques. Partially visualized left pacemaker device. IMPRESSION: 1. No acute intracranial pathology. Mild age-related atrophy and chronic microvascular ischemic changes. 2. No acute/traumatic cervical spine pathology. Electronically Signed   By: Elgie Collard M.D.   On: 08/18/2023 22:34   DG Chest 2 View  Result Date: 08/18/2023 CLINICAL DATA:  Hypoxia, fall. EXAM: CHEST - 2 VIEW COMPARISON:  March 28, 2009. FINDINGS: Stable cardiomediastinal silhouette. Status post coronary artery bypass graft. Left-sided pacemaker is unchanged. Lungs are clear. Bony thorax is unremarkable. IMPRESSION: No active cardiopulmonary disease. Electronically Signed   By: Lupita Raider M.D.   On: 08/18/2023 20:33     PROCEDURES:  Critical Care performed: No  .1-3 Lead EKG Interpretation  Performed by: Irean Hong, MD Authorized by: Irean Hong, MD     Interpretation: normal     ECG rate:  60   ECG rate assessment: normal     Rhythm: sinus rhythm     Ectopy: none     Conduction: normal   Comments:     Placed on cardiac monitor to evaluate for arrthymias    MEDICATIONS ORDERED IN ED: Medications  ipratropium-albuterol (DUONEB) 0.5-2.5 (3) MG/3ML nebulizer solution 3 mL (has no administration in time range)  sodium chloride 0.9 % bolus 500 mL (has no administration in time range)     IMPRESSION / MDM / ASSESSMENT AND PLAN / ED COURSE  I reviewed the triage vital signs and the nursing notes.                             89 year old male presenting with fall and skin tear secondary to syncopal episode and generalized weakness.  Differential diagnosis includes but is not limited to CVA, ACS, metabolic, infectious etiologies, etc.  I personally reviewed patient's records and note a  podiatry office visit on 08/16/2023 for mycotic toenails, type 2 diabetes with polyneuropathy.  Patient's presentation is most consistent with acute presentation with potential threat to life or bodily function.  The patient is on the cardiac monitor to evaluate for evidence of arrhythmia and/or significant heart rate changes.  Laboratory results demonstrate normal WBC 6.3, AKI with creatinine 1.29.  This is elevated compared with prior labs obtained at [facility] in  June of this year.  Troponin millimeter elevated x 2.  CT imaging studies negative for acute traumatic injury.  Chest x-ray negative for infiltrates.  Will administer DuoNeb for diminished aeration heard on exam.  Patient allergic to prednisone.  Administer IV fluid hydration.  Patient required heavy 2 person assistance to transfer him from wheelchair to bed.  Will consult hospital services for evaluation and admission.      FINAL CLINICAL IMPRESSION(S) / ED DIAGNOSES   Final diagnoses:  Fall, initial encounter  Hypoxia  Generalized weakness  Syncope, unspecified syncope type  AKI (acute kidney injury) (HCC)  Multiple skin tears     Rx / DC Orders   ED Discharge Orders     None        Note:  This document was prepared using Dragon voice recognition software and may include unintentional dictation errors.   Irean Hong, MD 08/19/23 [phone]

## 2023-08-19 NOTE — ED Notes (Signed)
Pt requesting to speak with provider regarding DNR status. Admitting provider Para March informed

## 2023-08-19 NOTE — Assessment & Plan Note (Addendum)
INR 1.2 No history of A-fib or DVT per record review (limited as patient also goes to the [facility]) Pharmacy consult for Coumadin management

## 2023-08-19 NOTE — Progress Notes (Signed)
PHARMACY - ANTICOAGULATION CONSULT NOTE  Pharmacy Consult for Warfarin  Indication:  unclear indication   , Hx of TIA   Allergies  Allergen Reactions   Allegra Allergy [Fexofenadine Hcl]    Penicillins    Prednisone    Shellfish Allergy     Patient Measurements: Height: 6\' 2"  (188 cm) Weight: 81.6 kg (180 lb) IBW/kg (Calculated) : 82.2 Heparin Dosing Weight:   Vital Signs: Temp: 97.4 F (36.3 C) (11/21 0116) Temp Source: Oral (11/20 2025) BP: 125/70 (11/21 0300) Pulse Rate: 56 (11/21 0400)  Labs: Recent Labs    08/18/23 1741 08/18/23 2025 08/19/23 0052  HGB 14.7  --   --   HCT 45.0  --   --   PLT 170  --   --   LABPROT  --   --  15.4*  INR  --   --  1.2  CREATININE 1.29*  --   --   TROPONINIHS 18* 18*  --     Estimated Creatinine Clearance: 44.8 mL/min (A) (by C-G formula based on SCr of 1.29 mg/dL (H)).   Medical History: Past Medical History:  Diagnosis Date   COPD (chronic obstructive pulmonary disease) (HCC)    Coronary artery disease    Hypertension    Presence of permanent cardiac pacemaker     Medications:  (Not in a hospital admission)   Assessment: Pharmacy consulted to dose warfarin in this 60 male with history of TIA.   Pt was on warfarin 4 mg PO daily, last dose on 11/20.  11/21:  INR @ 0052 = 1.2, SUBtherapeutic   Goal of Therapy:  INR 2-3    Plan:  - Will order warfarin 6 mg PO X 1 STAT on 11/21 @ 0430. - Will recheck INR on 11/22 with AM labs.   Shuna Tabor D 08/19/2023,4:07 AM

## 2023-08-19 NOTE — Assessment & Plan Note (Addendum)
Creatinine 1.29 up from baseline of 1.0, suspect ATN related to dehydration from diuretics Treated with a 500 cc NS bolus in the ED Continue to monitor

## 2023-08-19 NOTE — Assessment & Plan Note (Addendum)
Uncertain etiology but suspecting cardiac etiology history, dehydration CT head nonacute Continuous cardiac monitoring, echocardiogram and carotid Doppler D-dimer ordered given subtherapeutic INR Will get cardiology consult, due to multiple cardiac comorbidities

## 2023-08-19 NOTE — Assessment & Plan Note (Signed)
Possibly related to dehydration Received an IV fluid bolus PT eval

## 2023-08-19 NOTE — Consult Note (Signed)
Consultation Note Date: 08/19/2023 at 0900  Patient Name: Bryan Singleton  DOB: 04-25-1932  MRN: [id number]  Age / Sex: 89 y.o., male  PCP: Lynnea Ferrier, MD Referring Physician: Delfino Lovett, MD  HPI/Patient Profile: 89 y.o. male  with past medical history of COPD, CAD s/p CABG x 5, HTN, SSS s/p pacemaker, PVD, CHF, valvulopathy, and TIAs (Coumadin) admitted on 08/19/2023 with fall with right arm abrasion.  Patient being treated for syncopal event with collapse.  CT of head was negative.  Patient found to be in AKI.  Cause of syncope and collapse could be cardiac or dehydration.  PMT was consulted in ED for GOC discussions while workup is still pending.  Clinical Assessment and Goals of Care: Extensive chart review completed prior to meeting patient including labs, vital signs, imaging, progress notes, orders, and available advanced directive documents from current and previous encounters. I then met with patient and his wife at bedside in ED to discuss diagnosis prognosis, GOC, EOL wishes, disposition and options.  I introduced Palliative Medicine as specialized medical care for people living with serious illness. It focuses on providing relief from the symptoms and stress of a serious illness. The goal is to improve quality of life for both the patient and the family.  We discussed a brief life review of the patient.  Patient is a retired Research scientist (life sciences).  He and his wife have been married for over 66 years.  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  He and his wife have 3 children who live nearby and are involved in his care.  As far as functional and nutritional status patient and wife endorse patient had complete independence with ADLs PTA.  Wife is very concerned that this fall and "event" has "come out of nowhere".  She would like to know the cause of his collapse so  that she could avoid it again in the future.  We discussed that the etiology of his syncopal event may never be known.  We suspect it could have been a cardiac issue, dehydration, orthostatic hypotension, or other unknown factor.  Given that we were not present during the event, shared that it is likely a ruling out process to help determine what could have contributed to his event.  Patient's wife was appreciative of explanation.  I attempted to elicit values and goals of care important to the patient.  Patient and wife are in agreement that he would never want life-sustaining measures.  He would never want to be hooked up to machines, including a breathing tube or a feeding tube.  Patient would like to treat the treatable but allow natural passing should his heart and his lungs stop.  Patient and wife are in agreement and goals are clear.  Patient and wife are can and with patient's right arm pain.  I shared that medication regimen is being evaluated to address his discomfort.  Additionally, PT and OT will help evaluate patient to see what his mobility and  functional needs are.  During our discussion, cardiology came bedside to interrogate patient's pacemaker.  I discussed with patient and wife that interrogating the pacemaker will reveal if patient had any cardiac events during syncopal event.  Space and opportunity provided for patient and wife to ask questions.  Discussed with patient/family the importance of continued conversation with family and the medical providers regarding overall plan of care and treatment options, ensuring decisions are within the context of the patient's values and GOCs.    Questions and concerns were addressed. The family was encouraged to call with questions or concerns.   Goals are clear.  Treat the treatable.  DNR with limited interventions remains.  Patient would never be accepting of a ventilator, PEG tube, or artificial hydration to sustain his life.  Symptom  burden is low.  No adjustment to [facility] needed.  Patient and wife advised to reach out to PMT with any acute palliative needs.  They were also made aware that PMT will step back and monitor the patient peripherally.  Please reengage with PMT if goals change, at patient/family's request, or patient's health deteriorates during hospitalization.  Primary Decision Maker PATIENT  Physical Exam Vitals reviewed.  Constitutional:      Appearance: He is normal weight.  HENT:     Ears:     Comments: HOH    Mouth/Throat:     Mouth: Mucous membranes are moist.  Eyes:     Pupils: Pupils are equal, round, and reactive to light.  Pulmonary:     Effort: Pulmonary effort is normal.  Abdominal:     Palpations: Abdomen is soft.  Musculoskeletal:     Comments: MAETC, mild weakness in grip 4/5 and resistance 4/5 of RUE  Skin:    General: Skin is warm and dry.  Neurological:     Mental Status: He is alert and oriented to person, place, and time.  Psychiatric:        Mood and Affect: Mood normal.        Behavior: Behavior normal.        Thought Content: Thought content normal.        Judgment: Judgment normal.     Palliative Assessment/Data: 60-70%     Thank you for this consult. Palliative medicine will continue to follow and assist holistically.   Time Total: 75 minutes  Time spent includes: Detailed review of medical records (labs, imaging, vital signs), medically appropriate exam (mental status, respiratory, cardiac, skin), discussed with treatment team, counseling and educating patient, family and staff, documenting clinical information, medication management and coordination of care.  Signed by: Georgiann Cocker, DNP, FNP-BC Palliative Medicine   Please contact Palliative Medicine Team providers via [facility] for questions and concerns.

## 2023-08-19 NOTE — Progress Notes (Signed)
*  PRELIMINARY RESULTS* Echocardiogram 2D Echocardiogram has been performed.  Carolyne Fiscal 08/19/2023, 2:27 PM

## 2023-08-20 ENCOUNTER — Telehealth (HOSPITAL_COMMUNITY): Payer: Self-pay | Admitting: Pharmacy Technician

## 2023-08-20 ENCOUNTER — Other Ambulatory Visit: Payer: Self-pay

## 2023-08-20 ENCOUNTER — Encounter: Payer: Self-pay | Admitting: Internal Medicine

## 2023-08-20 ENCOUNTER — Other Ambulatory Visit (HOSPITAL_COMMUNITY): Payer: Self-pay

## 2023-08-20 ENCOUNTER — Observation Stay: Payer: Medicare PPO

## 2023-08-20 DIAGNOSIS — R531 Weakness: Secondary | ICD-10-CM

## 2023-08-20 DIAGNOSIS — I5022 Chronic systolic (congestive) heart failure: Secondary | ICD-10-CM | POA: Diagnosis not present

## 2023-08-20 DIAGNOSIS — R55 Syncope and collapse: Secondary | ICD-10-CM | POA: Diagnosis not present

## 2023-08-20 DIAGNOSIS — N179 Acute kidney failure, unspecified: Secondary | ICD-10-CM | POA: Diagnosis not present

## 2023-08-20 LAB — CBC
HCT: 44.3 % (ref 39.0–52.0)
Hemoglobin: 14.9 g/dL (ref 13.0–17.0)
MCH: 32 pg (ref 26.0–34.0)
MCHC: 33.6 g/dL (ref 30.0–36.0)
MCV: 95.3 fL (ref 80.0–100.0)
Platelets: 154 10*3/uL (ref 150–400)
RBC: 4.65 MIL/uL (ref 4.22–5.81)
RDW: 16 % — ABNORMAL HIGH (ref 11.5–15.5)
WBC: 7 10*3/uL (ref 4.0–10.5)
nRBC: 0 % (ref 0.0–0.2)

## 2023-08-20 LAB — BASIC METABOLIC PANEL
Anion gap: 10 (ref 5–15)
BUN: 28 mg/dL — ABNORMAL HIGH (ref 8–23)
CO2: 25 mmol/L (ref 22–32)
Calcium: 9.1 mg/dL (ref 8.9–10.3)
Chloride: 103 mmol/L (ref 98–111)
Creatinine, Ser: 1.06 mg/dL (ref 0.61–1.24)
GFR, Estimated: >60 mL/min (ref >60)
Glucose, Bld: 117 mg/dL — ABNORMAL HIGH (ref 70–99)
Potassium: 4.2 mmol/L (ref 3.5–5.1)
Sodium: 138 mmol/L (ref 135–145)

## 2023-08-20 LAB — GLUCOSE, CAPILLARY
Glucose-Capillary: 85 mg/dL (ref 70–99)
Glucose-Capillary: 158 mg/dL — ABNORMAL HIGH (ref 70–99)
Glucose-Capillary: 136 mg/dL — ABNORMAL HIGH (ref 70–99)

## 2023-08-20 LAB — ECHOCARDIOGRAM COMPLETE
AR max vel: 1.24 cm2
AV Area VTI: 1.05 cm2
AV Area mean vel: 1.18 cm2
AV Mean grad: 5 mm[Hg]
AV Peak grad: 10.6 mm[Hg]
Ao pk vel: 1.63 m/s
Area-P 1/2: 3.2 cm2
MV VTI: 1.59 cm2
S' Lateral: 3.8 cm

## 2023-08-20 LAB — PROTIME-INR
INR: 1.2 (ref 0.8–1.2)
Prothrombin Time: 15.2 s (ref 11.4–15.2)

## 2023-08-20 MED ORDER — APIXABAN 5 MG PO TABS
5.0000 mg | ORAL_TABLET | Freq: Two times a day (BID) | ORAL | Status: DC
  Administered 2023-08-20 – 2023-08-21 (×3): 5 mg via ORAL
  Filled 2023-08-20 (×3): qty 1

## 2023-08-20 MED ORDER — IOHEXOL 350 MG/ML SOLN
75.0000 mL | Freq: Once | INTRAVENOUS | Status: AC | PRN
  Administered 2023-08-20: 75 mL via INTRAVENOUS

## 2023-08-20 MED ORDER — APIXABAN 5 MG PO TABS
5.0000 mg | ORAL_TABLET | Freq: Two times a day (BID) | ORAL | 0 refills | Status: DC
  Filled 2023-08-20: qty 60, 30d supply, fill #0

## 2023-08-20 MED ORDER — WARFARIN SODIUM 6 MG PO TABS: 6.0000 mg | ORAL_TABLET | Freq: Once | ORAL | Status: DC

## 2023-08-20 MED ORDER — DAPAGLIFLOZIN PROPANEDIOL 10 MG PO TABS
10.0000 mg | ORAL_TABLET | Freq: Every day | ORAL | Status: DC
  Administered 2023-08-21: 10 mg via ORAL
  Filled 2023-08-20: qty 1

## 2023-08-20 MED ORDER — APIXABAN 5 MG PO TABS: 5.0000 mg | ORAL_TABLET | Freq: Two times a day (BID) | ORAL | Status: DC

## 2023-08-20 NOTE — Progress Notes (Signed)
PHARMACY - ANTICOAGULATION CONSULT NOTE  Pharmacy Consult for Warfarin  Indication: Afib ???    Allergies  Allergen Reactions   Allegra Allergy [Fexofenadine Hcl]    Penicillins    Prednisone    Shellfish Allergy    Patient Measurements: Height: 6\' 2"  (188 cm) Weight: 79.5 kg (175 lb 4.3 oz) IBW/kg (Calculated) : 82.2 Heparin Dosing Weight:   Vital Signs: Temp: 97.9 F (36.6 C) (11/22 0747) Temp Source: Axillary (11/22 0420) BP: 94/55 (11/22 0420) Pulse Rate: 72 (11/21 2301)  Labs: Recent Labs    08/18/23 1741 08/18/23 2025 08/19/23 0052 08/20/23 0528  HGB 14.7  --   --  14.9  HCT 45.0  --   --  44.3  PLT 170  --   --  154  LABPROT  --   --  15.4* 15.2  INR  --   --  1.2 1.2  CREATININE 1.29*  --   --  1.06  TROPONINIHS 18* 18*  --   --    Estimated Creatinine Clearance: 53.1 mL/min (by C-G formula based on SCr of 1.06 mg/dL).  Medical History: Past Medical History:  Diagnosis Date   COPD (chronic obstructive pulmonary disease) (HCC)    Coronary artery disease    Hypertension    Presence of permanent cardiac pacemaker     Medications:  Medications Prior to Admission  Medication Sig Dispense Refill Last Dose   atorvastatin (LIPITOR) 10 MG tablet Take 10 mg by mouth daily.      camphor-menthol (SARNA) lotion Apply 1 Application topically as needed.      dapagliflozin propanediol (FARXIGA) 10 MG TABS tablet Take 5 mg by mouth daily.      furosemide (LASIX) 40 MG tablet Take 40 mg by mouth.      gabapentin (NEURONTIN) 100 MG capsule Take 100 mg by mouth 3 (three) times daily.      imiquimod (ALDARA) 5 % cream Apply 1 Application topically at bedtime.      levothyroxine (SYNTHROID) 25 MCG tablet Take 25 mcg by mouth daily before breakfast.      lisinopril (ZESTRIL) 5 MG tablet Take 5 mg by mouth daily.      metoprolol succinate (TOPROL-XL) 25 MG 24 hr tablet Take 25 mg by mouth daily.      warfarin (COUMADIN) 4 MG tablet Take 4 mg by mouth daily.   08/18/2023    Assessment: RF is a 89 yo m who presented with syncope to the ED 11/21. They have a past medical history significant for CAD s/p CABG x 5, pacemaker, afib on warfarin, ischemic cardiomyopathy and chronic HFrEF, HTN, HDL and history of TIA. Pt was on PTA warfarin 4 mg PO daily, last dose on 11/20. Source of syncope undetermined at this time, but follow for further cardiac work-up. Pharmacy was consulted to dose warfarin.   Date INR Last Dose Clinical   11/21 INR @ 0052 = 1.2 Last dose 11/20 Subtherapeutic   11/22 INR @ 0528 = 1.2 6 mg Subtherapeutic                  Goal of Therapy:  INR 2-3  Plan:  Will reorder warfarin 6 mg PO x 1.  Patient is still subtherapeutic so ordered 1.5 x PTA regimen per anticoagulation guidelines (4 mg * 1.5 = 6 mg). Will recheck INR on 11/23 with AM labs.   Effie Shy, PharmD Pharmacy Resident  08/20/2023 9:35 AM

## 2023-08-20 NOTE — TOC Progression Note (Addendum)
Transition of Care [facility]) - Progression Note    Patient Details  Name: TAITE MALIN MRN: [id number] Date of Birth: August 13, 1932  Transition of Care [facility]) CM/SW Contact  Truddie Hidden, RN Phone Number: 08/20/2023, 3:25 PM  Clinical Narrative:    Spoke with patient at bedside regarding therapy's recommendation for HHPT/OT. Patient is agreeable to [facility] and does not have a choice of an agency. Patient advised the accepting agency will contact her directly to scheduled SOC within 48 post discharge.  Referral for [facility] sent and accepted by Adelina Mings at [facility].   Request for RW sent to Jon from [employer].         Expected Discharge Plan and Services                                               Social Determinants of Health (SDOH) Interventions SDOH Screenings   Food Insecurity: No Food Insecurity (08/19/2023)  Housing: Low Risk  (08/19/2023)  Transportation Needs: No Transportation Needs (08/19/2023)  Utilities: Not At Risk (08/19/2023)  Financial Resource Strain: Low Risk  (08/16/2023)   Received from [facility]  Tobacco Use: Medium Risk (01/12/2023)   Received from [facility]    Readmission Risk Interventions     No data to display

## 2023-08-20 NOTE — Evaluation (Signed)
Physical Therapy Evaluation Patient Details Name: Bryan Singleton MRN: [id number] DOB: [birth year] Today's Date: 08/20/2023  History of Present Illness  Patient is a 89 year old male with syncope and collapse with right arm abrasion. History of COPD, CAD s/p CABG x 5, HTN, SSS s/p pacemaker, PVD, CHF, TIA.  Clinical Impression  Patient is agreeable to PT evaluation. He is independent at baseline and lives with his spouse.  Today, the patient has proximal right leg weakness and numbness. He reports right arm clumsiness that has improved since yesterday. He was able to stand twice from the toilet with only CGA but needed Min A for standing from recliner chair. Patient also has mild right knee buckling with ambulation, requiring rolling walker for safety. No dizziness reported throughout session and vitals stable throughout. Spouse is concerned about patient coming home today and would like patient to stay another day for continued medical care and PT. Recommend PT follow up to maximize independence and to decrease caregiver burden.       If plan is discharge home, recommend the following: A little help with walking and/or transfers;A little help with bathing/dressing/bathroom;Assist for transportation;Help with stairs or ramp for entrance   Can travel by private vehicle        Equipment Recommendations Rolling walker (2 wheels)  Recommendations for Other Services       Functional Status Assessment Patient has had a recent decline in their functional status and demonstrates the ability to make significant improvements in function in a reasonable and predictable amount of time.     Precautions / Restrictions Precautions Precautions: Fall Restrictions Weight Bearing Restrictions: No      Mobility  Bed Mobility               General bed mobility comments:  (not assessed as patient sitting up on arrival and post session)    Transfers Overall transfer level: Needs  assistance Equipment used: Rolling walker (2 wheels) Transfers: Sit to/from Stand Sit to Stand: Min assist, Contact guard assist           General transfer comment: CGA for standing from toilet x 2 bouts and Min A for standing from recliner chair for steadying assistance.    Ambulation/Gait Ambulation/Gait assistance: Contact guard assist Gait Distance (Feet): 130 Feet Assistive device: Rolling walker (2 wheels) Gait Pattern/deviations: Decreased stance time - right, Decreased step length - right Gait velocity: decreased     General Gait Details: mild right knee instability noted with occasional mild buckling. patient relying heavily on rolling walker for support with ambulation. cues for safety with mobility  Stairs            Wheelchair Mobility     Tilt Bed    Modified Rankin (Stroke Patients Only)       Balance Overall balance assessment: Needs assistance Sitting-balance support: Feet supported Sitting balance-Leahy Scale: Good     Standing balance support: Bilateral upper extremity supported, Single extremity supported Standing balance-Leahy Scale: Fair Standing balance comment: increased UE support required to maintain dyanmic balance                             Pertinent Vitals/Pain      Home Living Family/patient expects to be discharged to:: Private residence Living Arrangements: Spouse/significant other;Children Available Help at Discharge: Family Type of Home: House Home Access: Stairs to enter   [employer] of Steps: 3-4   Home Layout: One level  Prior Function Prior Level of Function : Independent/Modified Independent                     Extremity/Trunk Assessment   Upper Extremity Assessment Upper Extremity Assessment: Right hand dominant;Defer to OT evaluation    Lower Extremity Assessment Lower Extremity Assessment: RLE deficits/detail RLE Deficits / Details: hip flexor 3+/5, knee  extension 4/5, dorsiflexion 5/5, plantarflexion 5/5. RLE Sensation: decreased light touch RLE Coordination:  (heel to shin without dysmetria)       Communication   Communication Communication: No apparent difficulties  Cognition Arousal: Alert Behavior During Therapy: WFL for tasks assessed/performed Overall Cognitive Status: Within Functional Limits for tasks assessed                                          General Comments      Exercises     Assessment/Plan    PT Assessment Patient needs continued PT services  PT Problem List Decreased strength;Decreased range of motion;Decreased activity tolerance;Decreased balance;Decreased mobility;Decreased safety awareness       PT Treatment Interventions DME instruction;Gait training;Stair training;Functional mobility training;Therapeutic activities;Therapeutic exercise;Balance training;Neuromuscular re-education;Cognitive remediation;Patient/family education    PT Goals (Current goals can be found in the Care Plan section)  Acute Rehab PT Goals Patient Stated Goal: to go home soon PT Goal Formulation: With patient/family Time For Goal Achievement: 09/03/23 Potential to Achieve Goals: Good    Frequency Min 1X/week     Co-evaluation               AM-PAC PT "6 Clicks" Mobility  Outcome Measure Help needed turning from your back to your side while in a flat bed without using bedrails?: A Little Help needed moving from lying on your back to sitting on the side of a flat bed without using bedrails?: A Little Help needed moving to and from a bed to a chair (including a wheelchair)?: A Little Help needed standing up from a chair using your arms (e.g., wheelchair or bedside chair)?: A Little Help needed to walk in hospital room?: A Little Help needed climbing 3-5 steps with a railing? : A Lot 6 Click Score: 17    End of Session Equipment Utilized During Treatment: Gait belt Activity Tolerance: Patient  tolerated treatment well Patient left: in chair;with call bell/phone within reach;with family/visitor present   PT Visit Diagnosis: Other abnormalities of gait and mobility (R26.89);Difficulty in walking, not elsewhere classified (R26.2)    Time: 2956-2130 PT Time Calculation (min) (ACUTE ONLY): 44 min   Charges:   PT Evaluation $PT Eval Low Complexity: 1 Low PT Treatments $Therapeutic Exercise: 8-22 mins $Therapeutic Activity: 8-22 mins PT General Charges $$ ACUTE PT VISIT: 1 Visit         Donna Bernard, PT, MPT   Ina Homes 08/20/2023, 12:41 PM

## 2023-08-20 NOTE — Care Management Obs Status (Signed)
MEDICARE OBSERVATION STATUS NOTIFICATION   Patient Details  Name: Bryan Singleton MRN: [id number] Date of Birth: March 10, 1932   Medicare Observation Status Notification Given:  Yes    Truddie Hidden, RN 08/20/2023, 2:02 PM

## 2023-08-20 NOTE — Evaluation (Signed)
Occupational Therapy Evaluation Patient Details Name: Bryan Singleton MRN: [id number] DOB: 26-Oct-1931 Today's Date: 08/20/2023   History of Present Illness Patient is a 89 year old male with syncope and collapse with right arm abrasion. History of COPD, CAD s/p CABG x 5, HTN, SSS s/p pacemaker, PVD, CHF, TIA.   Clinical Impression   Pt was seen for OT evaluation this date. Prior to hospital admission, pt was IND with all ADL's and IADL's. Pt lives with wife. Pt presents to acute OT demonstrating impaired ADL performance and functional mobility 2/2 (See OT problem list for additional functional deficits). Upon arrival to room pt sitting at EOB with breakfast tray, agreeable to tx. Pt completed STS with CGA +RW. BP monitored during session, values are down below. Pt ambulated to the bathroom and completed toilet t/f with CGA +RW. Pt completed MMT and demonstrated BUE AROM in all planes of movement, noted to have decreased strength in RUE and pt describes his R arm to feel "floppy." Pt handoff to PT. Pt would benefit from skilled OT services to address noted impairments and functional limitations (see below for any additional details) in order to maximize safety and independence while minimizing falls risk and caregiver burden. Anticipate the need for follow up OT services upon acute hospital DC.    BP: Sitting-135/110 Standing-117/95      If plan is discharge home, recommend the following: A little help with bathing/dressing/bathroom;Assistance with cooking/housework;Assist for transportation;Help with stairs or ramp for entrance;A little help with walking and/or transfers    Functional Status Assessment  Patient has had a recent decline in their functional status and demonstrates the ability to make significant improvements in function in a reasonable and predictable amount of time.  Equipment Recommendations  Other (comment) (Defer to next venue of care)    Recommendations for Other  Services       Precautions / Restrictions Precautions Precautions: Fall Restrictions Weight Bearing Restrictions: No      Mobility Bed Mobility                 Patient Response: Cooperative  Transfers Overall transfer level: Needs assistance Equipment used: Rolling walker (2 wheels) Transfers: Sit to/from Stand Sit to Stand: Contact guard assist                  Balance Overall balance assessment: Needs assistance Sitting-balance support: Feet supported Sitting balance-Leahy Scale: Good     Standing balance support: Bilateral upper extremity supported, During functional activity, Reliant on assistive device for balance Standing balance-Leahy Scale: Fair Standing balance comment: increased UE support required to maintain dyanmic balance                           ADL either performed or assessed with clinical judgement   ADL Overall ADL's : Needs assistance/impaired                                     Functional mobility during ADLs: Contact guard assist;Rolling walker (2 wheels) General ADL Comments: Pt ambulated to the bathroom and completed toilet t/f with CGA +RW. Pt completed MMT and demonstrated BUE AROM in all planes of movement, noted to have decreased strength in RUE and pt decribes his R arm to be "floppy."     Vision         Perception  Praxis         Pertinent Vitals/Pain Pain Assessment Pain Assessment: No/denies pain     Extremity/Trunk Assessment Upper Extremity Assessment Upper Extremity Assessment: Right hand dominant;RUE deficits/detail RUE Deficits / Details: Decreased strength   Lower Extremity Assessment Lower Extremity Assessment: RLE deficits/detail RLE Deficits / Details: hip flexor 3+/5, knee extension 4/5, dorsiflexion 5/5, plantarflexion 5/5. RLE Sensation: decreased light touch RLE Coordination:  (heel to shin without dysmetria)       Communication  Communication Communication: No apparent difficulties   Cognition Arousal: Alert Behavior During Therapy: WFL for tasks assessed/performed Overall Cognitive Status: Within Functional Limits for tasks assessed                                       General Comments       Exercises     Shoulder Instructions      Home Living Family/patient expects to be discharged to:: Private residence Living Arrangements: Spouse/significant other;Children Available Help at Discharge: Family Type of Home: House Home Access: Stairs to enter Secretary/administrator of Steps: 3-4 Entrance Stairs-Rails: Can reach both;Left;Right Home Layout: One level     Bathroom Shower/Tub: Producer, television/film/video: Standard     Home Equipment: None          Prior Functioning/Environment Prior Level of Function : Independent/Modified Independent             Mobility Comments: No Ad at baseline ADLs Comments: Ind        OT Problem List: Decreased strength;Decreased range of motion;Decreased activity tolerance;Impaired balance (sitting and/or standing)      OT Treatment/Interventions: Self-care/ADL training;Therapeutic exercise;Therapeutic activities;Energy conservation;DME and/or AE instruction;Patient/family education    OT Goals(Current goals can be found in the care plan section) Acute Rehab OT Goals Patient Stated Goal: to go home OT Goal Formulation: With patient/family Time For Goal Achievement: 09/03/23 Potential to Achieve Goals: Good  OT Frequency: Min 1X/week    Co-evaluation              AM-PAC OT "6 Clicks" Daily Activity     Outcome Measure Help from another person eating meals?: None Help from another person taking care of personal grooming?: None Help from another person toileting, which includes using toliet, bedpan, or urinal?: A Little Help from another person bathing (including washing, rinsing, drying)?: A Little Help from another person  to put on and taking off regular upper body clothing?: None Help from another person to put on and taking off regular lower body clothing?: A Little 6 Click Score: 21   End of Session Equipment Utilized During Treatment: Rolling walker (2 wheels)  Activity Tolerance: Patient tolerated treatment well Patient left: with family/visitor present;Other (comment) (handoff to PT)  OT Visit Diagnosis: Unsteadiness on feet (R26.81);Muscle weakness (generalized) (M62.81);History of falling (Z91.81);Other abnormalities of gait and mobility (R26.89)                Time: 7829-5621 OT Time Calculation (min): 10 min Charges:     Butch Penny, SOT

## 2023-08-20 NOTE — Progress Notes (Addendum)
1      PROGRESS NOTE    Bryan Singleton  ZOX:[id number] DOB: May 18, 1932 DOA: 08/19/2023 PCP: Lynnea Ferrier, MD   Brief Narrative:   89 y.o. male with medical history significant for Multiple medical problems being admitted with a syncopal episode resulting in a fall sustaining an abrasion to the right forearm.  11/21: Cardiology and palliative care consult 11/22: Increasing Farxiga dose switching Coumadin to Eliquis, CT angio ruled out PE   Assessment & Plan:   Principal Problem:   Syncope and collapse Active Problems:   AKI (acute kidney injury) (HCC)   Generalized weakness   SSS (sick sinus syndrome) (HCC)   Severe tricuspid valve insufficiency   Chronic systolic CHF (congestive heart failure), NYHA class 2 (HCC)   Presence of permanent cardiac pacemaker   Moderate mitral insufficiency   Controlled type 2 diabetes mellitus with complication, without long-term current use of insulin (HCC)   Hx of CABG x 5   Long term (current) use of warfarin   History of TIAs   Peripheral vascular disease (HCC)   Coronary artery disease s/p CABG x 5   Subtherapeutic international normalized ratio (INR)  * Syncope and collapse Uncertain etiology CT head nonacute CT angio ruled out PE -Unable to get MRI with a spacer.  Could consider as an outpatient after evaluation by neurology -EKG primarily showing PVCs and ventricular paced rhythm. Troponin mildly elevated not suggestive of MI and likely demand ischemia -Pacer interrogation showing no recent event to explain the episode of syncope.   Generalized weakness Possibly related to dehydration Received an IV fluid bolus PT eval -recommends home health PT   AKI (acute kidney injury) (HCC) Creatinine 1.29 up from baseline of 1.0, suspect ATN related to dehydration from diuretics Now resolved with hydration   SSS (sick sinus syndrome) (HCC) S/p pacemaker Atrial fibrillation on Coumadin with subtherapeutic INR on admission Severe  tricuspid valve insufficiency and moderate mitral insufficiency Continuous telemetry monitoring Echocardiogram unchanged.  Normal LV systolic function -discussed with pharmacy.  Will increase the dose of Farxiga Cardiology following Switching Coumadin to Eliquis this admission   Controlled type 2 diabetes mellitus with complication, without long-term current use of insulin (HCC) Sliding scale insulin coverage    Coronary artery disease s/p CABG x 5 PVD Continue metoprolol, atorvastatin, lisinopril, Eliquis   History of TIAs Peripheral vascular disease Continue Eliquis and statin     DVT prophylaxis: Eliquis  apixaban (ELIQUIS) tablet 5 mg     Code Status: DNR Family Communication: Updated wife at bedside Disposition Plan: Discharge home tomorrow with home health   Consultants:  Cardiology Palliative care    Subjective:  Sitting in the chair.  No further syncopal spell while in the hospital feeling much better.  Wife at bedside  Objective: Vitals:   08/19/23 2301 08/20/23 0420 08/20/23 0747 08/20/23 1148  BP: 112/65 (!) 94/55 107/70 120/78  Pulse: 72  70 72  Resp: 18 (!) 21 15 16   Temp: 98.1 F (36.7 C) (!) 96.7 F (35.9 C) 97.9 F (36.6 C) 97.8 F (36.6 C)  TempSrc:  Axillary Oral Oral  SpO2: 97% 91% 99% 99%  Weight:      Height:        Intake/Output Summary (Last 24 hours) at 08/20/2023 1804 Last data filed at 08/20/2023 1030 Gross per 24 hour  Intake 717 ml  Output 650 ml  Net 67 ml   Filed Weights   08/18/23 1728 08/19/23 2100  Weight: 81.6  kg 79.5 kg    Examination:  General exam: Appears calm and comfortable  Respiratory system: Clear to auscultation. Respiratory effort normal. Cardiovascular system: S1 & S2 heard, RRR. No JVD, murmurs, rubs, gallops or clicks. No pedal edema. Gastrointestinal system: Abdomen is nondistended, soft and nontender. No organomegaly or masses felt. Normal bowel sounds heard. Central nervous system: Alert and  oriented. No focal neurological deficits. Extremities: Symmetric 5 x 5 power. Skin: No rashes, lesions or ulcers Psychiatry: Judgement and insight appear normal. Mood & affect appropriate.     Data Reviewed: I have personally reviewed following labs and imaging studies  CBC: Recent Labs  Lab 08/18/23 1741 08/20/23 0528  WBC 6.3 7.0  HGB 14.7 14.9  HCT 45.0 44.3  MCV 97.0 95.3  PLT 170 154   Basic Metabolic Panel: Recent Labs  Lab 08/18/23 1741 08/20/23 0528  NA 138 138  K 3.8 4.2  CL 100 103  CO2 29 25  GLUCOSE 126* 117*  BUN 34* 28*  CREATININE 1.29* 1.06  CALCIUM 9.2 9.1   GFR: Estimated Creatinine Clearance: 53.1 mL/min (by C-G formula based on SCr of 1.06 mg/dL). Liver Function Tests: No results for input(s): "AST", "ALT", "ALKPHOS", "BILITOT", "PROT", "ALBUMIN" in the last 168 hours. No results for input(s): "LIPASE", "AMYLASE" in the last 168 hours. No results for input(s): "AMMONIA" in the last 168 hours. Coagulation Profile: Recent Labs  Lab 08/19/23 0052 08/20/23 0528  INR 1.2 1.2   Cardiac Enzymes: No results for input(s): "CKTOTAL", "CKMB", "CKMBINDEX", "TROPONINI" in the last 168 hours. BNP (last 3 results) No results for input(s): "PROBNP" in the last 8760 hours. HbA1C: Recent Labs    08/19/23 0539  HGBA1C 6.5*   CBG: Recent Labs  Lab 08/19/23 1632 08/19/23 1741 08/19/23 2128 08/20/23 0918 08/20/23 1548  GLUCAP 109* 114* 157* 85 158*   Lipid Profile: No results for input(s): "CHOL", "HDL", "LDLCALC", "TRIG", "CHOLHDL", "LDLDIRECT" in the last 72 hours. Thyroid Function Tests: No results for input(s): "TSH", "T4TOTAL", "FREET4", "T3FREE", "THYROIDAB" in the last 72 hours. Anemia Panel: No results for input(s): "VITAMINB12", "FOLATE", "FERRITIN", "TIBC", "IRON", "RETICCTPCT" in the last 72 hours. Sepsis Labs: No results for input(s): "PROCALCITON", "LATICACIDVEN" in the last 168 hours.  Recent Results (from the past 240 hour(s))   Resp panel by RT-PCR (RSV, Flu A&B, Covid) Anterior Nasal Swab     Status: None   Collection Time: 08/19/23 12:52 AM   Specimen: Anterior Nasal Swab  Result Value Ref Range Status   SARS Coronavirus 2 by RT PCR NEGATIVE NEGATIVE Final    Comment: (NOTE) SARS-CoV-2 target nucleic acids are NOT DETECTED.  The SARS-CoV-2 RNA is generally detectable in upper respiratory specimens during the acute phase of infection. The lowest concentration of SARS-CoV-2 viral copies this assay can detect is 138 copies/mL. A negative result does not preclude SARS-Cov-2 infection and should not be used as the sole basis for treatment or other patient management decisions. A negative result may occur with  improper specimen collection/handling, submission of specimen other than nasopharyngeal swab, presence of viral mutation(s) within the areas targeted by this assay, and inadequate number of viral copies(<138 copies/mL). A negative result must be combined with clinical observations, patient history, and epidemiological information. The expected result is Negative.  Fact Sheet for Patients:  BloggerCourse.com  Fact Sheet for Healthcare Providers:  SeriousBroker.it  This test is no t yet approved or cleared by the [place] FDA and  has been authorized for detection and/or diagnosis of  SARS-CoV-2 by FDA under an Emergency Use Authorization (EUA). This EUA will remain  in effect (meaning this test can be used) for the duration of the COVID-19 declaration under Section 564(b)(1) of the Act, 21 U.S.C.section 360bbb-3(b)(1), unless the authorization is terminated  or revoked sooner.       Influenza A by PCR NEGATIVE NEGATIVE Final   Influenza B by PCR NEGATIVE NEGATIVE Final    Comment: (NOTE) The Xpert Xpress SARS-CoV-2/FLU/RSV plus assay is intended as an aid in the diagnosis of influenza from Nasopharyngeal swab specimens and should not be used  as a sole basis for treatment. Nasal washings and aspirates are unacceptable for Xpert Xpress SARS-CoV-2/FLU/RSV testing.  Fact Sheet for Patients: BloggerCourse.com  Fact Sheet for Healthcare Providers: SeriousBroker.it  This test is not yet approved or cleared by the [place] FDA and has been authorized for detection and/or diagnosis of SARS-CoV-2 by FDA under an Emergency Use Authorization (EUA). This EUA will remain in effect (meaning this test can be used) for the duration of the COVID-19 declaration under Section 564(b)(1) of the Act, 21 U.S.C. section 360bbb-3(b)(1), unless the authorization is terminated or revoked.     Resp Syncytial Virus by PCR NEGATIVE NEGATIVE Final    Comment: (NOTE) Fact Sheet for Patients: BloggerCourse.com  Fact Sheet for Healthcare Providers: SeriousBroker.it  This test is not yet approved or cleared by the [place] FDA and has been authorized for detection and/or diagnosis of SARS-CoV-2 by FDA under an Emergency Use Authorization (EUA). This EUA will remain in effect (meaning this test can be used) for the duration of the COVID-19 declaration under Section 564(b)(1) of the Act, 21 U.S.C. section 360bbb-3(b)(1), unless the authorization is terminated or revoked.  Performed at [facility], [address] [place], [place] [address]          Radiology Studies: ECHOCARDIOGRAM COMPLETE  Result Date: 08/20/2023    ECHOCARDIOGRAM REPORT   Patient Name:   Bryan Singleton Date of Exam: 08/19/2023 Medical Rec #:  [id number]         Height:       74.0 in Accession #:    [id number]        Weight:       180.0 lb Date of Birth:  December 13, 1931         BSA:          2.078 m Patient Age:    89 years          BP:           139/97 mmHg Patient Gender: M                 HR:           87 bpm. Exam Location:  [facility] Procedure: 2D Echo,  Cardiac Doppler and Color Doppler Indications:     Syncope  History:         Patient has no prior history of Echocardiogram examinations.                  CHF, CAD, Prior CABG and Pacemaker, TIA,                  Signs/Symptoms:Syncope; Risk Factors:Diabetes.  Sonographer:     Mikki Harbor Referring Phys:  [id number] Andris Baumann Diagnosing Phys: Marcina Millard MD  Sonographer Comments: Technically difficult study due to poor echo windows and suboptimal apical window. Image acquisition challenging due to respiratory motion. IMPRESSIONS  1. Left  ventricular ejection fraction, by estimation, is 50 to 55%. The left ventricle has low normal function. The left ventricle has no regional wall motion abnormalities. Indeterminate diastolic filling due to E-A fusion.  2. Right ventricular systolic function is normal. The right ventricular size is normal. There is mildly elevated pulmonary artery systolic pressure.  3. The mitral valve is normal in structure. Mild mitral valve regurgitation. No evidence of mitral stenosis.  4. Tricuspid valve regurgitation is mild to moderate.  5. The aortic valve is normal in structure. Aortic valve regurgitation is not visualized. Mild aortic valve stenosis.  6. The inferior vena cava is normal in size with greater than 50% respiratory variability, suggesting right atrial pressure of 3 mmHg. FINDINGS  Left Ventricle: Left ventricular ejection fraction, by estimation, is 50 to 55%. The left ventricle has low normal function. The left ventricle has no regional wall motion abnormalities. The left ventricular internal cavity size was normal in size. There is no left ventricular hypertrophy. Indeterminate diastolic filling due to E-A fusion. Right Ventricle: The right ventricular size is normal. No increase in right ventricular wall thickness. Right ventricular systolic function is normal. There is mildly elevated pulmonary artery systolic pressure. The tricuspid regurgitant velocity is  2.50  m/s, and with an assumed right atrial pressure of 15 mmHg, the estimated right ventricular systolic pressure is 40.0 mmHg. Left Atrium: Left atrial size was normal in size. Right Atrium: Right atrial size was normal in size. Pericardium: There is no evidence of pericardial effusion. Mitral Valve: The mitral valve is normal in structure. Mild mitral valve regurgitation. No evidence of mitral valve stenosis. MV peak gradient, 2.5 mmHg. The mean mitral valve gradient is 1.0 mmHg. Tricuspid Valve: The tricuspid valve is normal in structure. Tricuspid valve regurgitation is mild to moderate. No evidence of tricuspid stenosis. Aortic Valve: The aortic valve is normal in structure. Aortic valve regurgitation is not visualized. Mild aortic stenosis is present. Aortic valve mean gradient measures 5.0 mmHg. Aortic valve peak gradient measures 10.6 mmHg. Aortic valve area, by VTI measures 1.05 cm. Pulmonic Valve: The pulmonic valve was normal in structure. Pulmonic valve regurgitation is not visualized. No evidence of pulmonic stenosis. Aorta: The aortic root is normal in size and structure. Venous: The inferior vena cava is normal in size with greater than 50% respiratory variability, suggesting right atrial pressure of 3 mmHg. IAS/Shunts: No atrial level shunt detected by color flow Doppler. Additional Comments: A device lead is visualized.  LEFT VENTRICLE PLAX 2D LVIDd:         5.10 cm LVIDs:         3.80 cm LV PW:         1.20 cm LV IVS:        1.40 cm LVOT diam:     2.00 cm LV SV:         38 LV SV Index:   18 LVOT Area:     3.14 cm  RIGHT VENTRICLE RV Basal diam:  6.00 cm RV Mid diam:    4.70 cm LEFT ATRIUM           Index        RIGHT ATRIUM           Index LA diam:      4.40 cm 2.12 cm/m   RA Area:     37.30 cm LA Vol (A4C): 54.6 ml 26.27 ml/m  RA Volume:   173.00 ml 83.24 ml/m  AORTIC VALVE  PULMONIC VALVE AV Area (Vmax):    1.24 cm      PV Vmax:       0.85 m/s AV Area (Vmean):   1.18  cm      PV Peak grad:  2.9 mmHg AV Area (VTI):     1.05 cm AV Vmax:           163.00 cm/s AV Vmean:          101.750 cm/s AV VTI:            0.361 m AV Peak Grad:      10.6 mmHg AV Mean Grad:      5.0 mmHg LVOT Vmax:         64.30 cm/s LVOT Vmean:        38.350 cm/s LVOT VTI:          0.120 m LVOT/AV VTI ratio: 0.33  AORTA Ao Root diam: 3.80 cm MITRAL VALVE               TRICUSPID VALVE MV Area (PHT): 3.20 cm    TR Peak grad:   25.0 mmHg MV Area VTI:   1.59 cm    TR Vmax:        250.00 cm/s MV Peak grad:  2.5 mmHg MV Mean grad:  1.0 mmHg    SHUNTS MV Vmax:       0.80 m/s    Systemic VTI:  0.12 m MV Vmean:      40.2 cm/s   Systemic Diam: 2.00 cm MV Decel Time: 237 msec MV E velocity: 78.60 cm/s Marcina Millard MD Electronically signed by Marcina Millard MD Signature Date/Time: 08/20/2023/3:54:14 PM    Final    CT Angio Chest Pulmonary Embolism (PE) W or WO Contrast  Result Date: 08/20/2023 CLINICAL DATA:  Hypoxia. EXAM: CT ANGIOGRAPHY CHEST WITH CONTRAST TECHNIQUE: Multidetector CT imaging of the chest was performed using the standard protocol during bolus administration of intravenous contrast. Multiplanar CT image reconstructions and MIPs were obtained to evaluate the vascular anatomy. RADIATION DOSE REDUCTION: This exam was performed according to the departmental dose-optimization program which includes automated exposure control, adjustment of the mA and/or kV according to patient size and/or use of iterative reconstruction technique. CONTRAST:  75mL OMNIPAQUE IOHEXOL 350 MG/ML SOLN COMPARISON:  August 18, 2023. FINDINGS: Cardiovascular: Satisfactory opacification of the pulmonary arteries to the segmental level. No evidence of pulmonary embolism. Right atrial enlargement is noted. Status post coronary artery bypass graft. Left-sided pacemaker is noted. No pericardial effusion. Mediastinum/Nodes: No enlarged mediastinal, hilar, or axillary lymph nodes. Thyroid gland, trachea, and esophagus  demonstrate no significant findings. Lungs/Pleura: No pneumothorax or pleural effusion is noted. Minimal bibasilar subsegmental atelectasis or scarring is noted. Upper Abdomen: No acute abnormality. Musculoskeletal: No chest wall abnormality. No acute or significant osseous findings. Review of the MIP images confirms the above findings. IMPRESSION: No definite evidence of pulmonary embolus. Right atrial enlargement is noted as well as enlargement of IVC and hepatic veins. Status post coronary artery bypass graft. Minimal bibasilar subsegmental atelectasis or scarring. Aortic Atherosclerosis (ICD10-I70.0). Electronically Signed   By: Lupita Raider M.D.   On: 08/20/2023 11:59   CT Head Wo Contrast  Result Date: 08/18/2023 CLINICAL DATA:  Syncope. EXAM: CT HEAD WITHOUT CONTRAST CT CERVICAL SPINE WITHOUT CONTRAST TECHNIQUE: Multidetector CT imaging of the head and cervical spine was performed following the standard protocol without intravenous contrast. Multiplanar CT image reconstructions of the cervical spine were also generated. RADIATION DOSE  REDUCTION: This exam was performed according to the departmental dose-optimization program which includes automated exposure control, adjustment of the mA and/or kV according to patient size and/or use of iterative reconstruction technique. COMPARISON:  Head CT dated 02/24/2010. FINDINGS: CT HEAD FINDINGS Brain: Mild age-related atrophy and chronic microvascular ischemic changes. There is no acute intracranial hemorrhage. No mass effect or midline shift. No extra-axial fluid collection. Vascular: No hyperdense vessel or unexpected calcification. Skull: Normal. Negative for fracture or focal lesion. Sinuses/Orbits: No acute finding. Other: None CT CERVICAL SPINE FINDINGS Alignment: No acute subluxation. Skull base and vertebrae: No acute fracture.  Osteopenia. Soft tissues and spinal canal: No prevertebral fluid or swelling. No visible canal hematoma. Disc levels:  No  acute findings.  Degenerative changes. Upper chest: Negative. Other: Bilateral carotid bulb calcified plaques. Partially visualized left pacemaker device. IMPRESSION: 1. No acute intracranial pathology. Mild age-related atrophy and chronic microvascular ischemic changes. 2. No acute/traumatic cervical spine pathology. Electronically Signed   By: Elgie Collard M.D.   On: 08/18/2023 22:34   CT Cervical Spine Wo Contrast  Result Date: 08/18/2023 CLINICAL DATA:  Syncope. EXAM: CT HEAD WITHOUT CONTRAST CT CERVICAL SPINE WITHOUT CONTRAST TECHNIQUE: Multidetector CT imaging of the head and cervical spine was performed following the standard protocol without intravenous contrast. Multiplanar CT image reconstructions of the cervical spine were also generated. RADIATION DOSE REDUCTION: This exam was performed according to the departmental dose-optimization program which includes automated exposure control, adjustment of the mA and/or kV according to patient size and/or use of iterative reconstruction technique. COMPARISON:  Head CT dated 02/24/2010. FINDINGS: CT HEAD FINDINGS Brain: Mild age-related atrophy and chronic microvascular ischemic changes. There is no acute intracranial hemorrhage. No mass effect or midline shift. No extra-axial fluid collection. Vascular: No hyperdense vessel or unexpected calcification. Skull: Normal. Negative for fracture or focal lesion. Sinuses/Orbits: No acute finding. Other: None CT CERVICAL SPINE FINDINGS Alignment: No acute subluxation. Skull base and vertebrae: No acute fracture.  Osteopenia. Soft tissues and spinal canal: No prevertebral fluid or swelling. No visible canal hematoma. Disc levels:  No acute findings.  Degenerative changes. Upper chest: Negative. Other: Bilateral carotid bulb calcified plaques. Partially visualized left pacemaker device. IMPRESSION: 1. No acute intracranial pathology. Mild age-related atrophy and chronic microvascular ischemic changes. 2. No  acute/traumatic cervical spine pathology. Electronically Signed   By: Elgie Collard M.D.   On: 08/18/2023 22:34        Scheduled Meds:  apixaban  5 mg Oral BID   atorvastatin  10 mg Oral Daily   [START ON 08/21/2023] dapagliflozin propanediol  10 mg Oral Daily   insulin aspart  0-15 Units Subcutaneous TID WC   insulin aspart  0-5 Units Subcutaneous QHS   levothyroxine  25 mcg Oral Q0600   lisinopril  5 mg Oral Daily   metoprolol succinate  50 mg Oral Daily   sodium chloride flush  3 mL Intravenous Q12H   Continuous Infusions:   LOS: 0 days    Time spent: 35 minutes    [place] Veals Sherryll Burger, MD [facility] Pager [phone]  If 7PM-7AM, please contact night-coverage [email or web address] Password [facility] 08/20/2023, 6:04 PM

## 2023-08-20 NOTE — Progress Notes (Signed)
[facility] CLINIC CARDIOLOGY PROGRESS NOTE       Patient ID: Bryan Singleton MRN: [id number] DOB/AGE: [birth year] 89 y.o.  Admit date: 08/19/2023 Referring Physician Dr. Delfino Lovett Primary Physician Lynnea Ferrier, MD  Primary Cardiologist Dr. Dorothyann Peng Reason for Consultation syncope  HPI: Bryan Singleton is a 89 y.o. male  with a past medical history of coronary artery disease s/p CABG x 5, sick sinus syndrome s/p PPM, with generator change 05/2020, atrial fibrillation on Eliquis, ischemic cardiomyopathy, chronic HFrEF, hypertension, hyperlipidemia, history of TIA who presented to the ED on 08/19/2023 for syncope and fall. Cardiology was consulted for further evaluation.   Interval history: -Patient reports feeling much better this AM. Feels less weak, R arm less "floppy". Worked with PT and tolerated this well.  -HR remains controlled, V pacing with PVCs. He is without CP, SOB.  -BP controlled, denies any dizziness.   Review of systems complete and found to be negative unless listed above    Past Medical History:  Diagnosis Date   COPD (chronic obstructive pulmonary disease) (HCC)    Coronary artery disease    Hypertension    Presence of permanent cardiac pacemaker     Past Surgical History:  Procedure Laterality Date   PACEMAKER INSERTION N/A 06/04/2020   Procedure: GENERATOR Change out;  Surgeon: Marcina Millard, MD;  Location: [facility] ORS;  Service: Cardiovascular;  Laterality: N/A;    Medications Prior to Admission  Medication Sig Dispense Refill Last Dose   atorvastatin (LIPITOR) 10 MG tablet Take 10 mg by mouth daily.      camphor-menthol (SARNA) lotion Apply 1 Application topically as needed.      dapagliflozin propanediol (FARXIGA) 10 MG TABS tablet Take 5 mg by mouth daily.      furosemide (LASIX) 40 MG tablet Take 40 mg by mouth.      gabapentin (NEURONTIN) 100 MG capsule Take 100 mg by mouth 3 (three) times daily.      imiquimod (ALDARA) 5 % cream  Apply 1 Application topically at bedtime.      levothyroxine (SYNTHROID) 25 MCG tablet Take 25 mcg by mouth daily before breakfast.      lisinopril (ZESTRIL) 5 MG tablet Take 5 mg by mouth daily.      metoprolol succinate (TOPROL-XL) 25 MG 24 hr tablet Take 25 mg by mouth daily.      warfarin (COUMADIN) 4 MG tablet Take 4 mg by mouth daily.   08/18/2023   Social History   Socioeconomic History   Marital status: Married    Spouse name: Not on file   Number of children: Not on file   Years of education: Not on file   Highest education level: Not on file  Occupational History   Not on file  Tobacco Use   Smoking status: Not on file   Smokeless tobacco: Not on file  Substance and Sexual Activity   Alcohol use: Not on file   Drug use: Not on file   Sexual activity: Not on file  Other Topics Concern   Not on file  Social History Narrative   Not on file   Social Determinants of Health   Financial Resource Strain: Low Risk  (08/16/2023)   Received from [facility]   Overall Financial Resource Strain (CARDIA)    Difficulty of Paying Living Expenses: Not hard at all  Food Insecurity: No Food Insecurity (08/19/2023)   Hunger Vital Sign    Worried About Running Out of  Food in the Last Year: Never true    Ran Out of Food in the Last Year: Never true  Transportation Needs: No Transportation Needs (08/19/2023)   PRAPARE - Administrator, Civil Service (Medical): No    Lack of Transportation (Non-Medical): No  Physical Activity: Not on file  Stress: Not on file  Social Connections: Not on file  Intimate Partner Violence: Not At Risk (08/19/2023)   Humiliation, Afraid, Rape, and Kick questionnaire    Fear of Current or Ex-Partner: No    Emotionally Abused: No    Physically Abused: No    Sexually Abused: No    History reviewed. No pertinent family history.   Vitals:   08/19/23 2301 08/20/23 0420 08/20/23 0747 08/20/23 1148  BP: 112/65 (!) 94/55  107/70 120/78  Pulse: 72  70 72  Resp: 18 (!) 21 15 16   Temp: 98.1 F (36.7 C) (!) 96.7 F (35.9 C) 97.9 F (36.6 C) 97.8 F (36.6 C)  TempSrc:  Axillary Oral Oral  SpO2: 97% 91% 99% 99%  Weight:      Height:        PHYSICAL EXAM General: Chronically ill-appearing elderly male, well nourished, in no acute distress sitting upright in bedside chair with wife present at bedside. HEENT: Normocephalic and atraumatic. Neck: No JVD.  Lungs: Normal respiratory effort on room air. Clear bilaterally to auscultation. No wheezes, crackles, rhonchi.  Heart: HRRR. Normal S1 and S2 without gallops or murmurs.  Abdomen: Non-distended appearing.  Msk: Normal strength and tone for age. Extremities: Warm and well perfused. No clubbing, cyanosis.  No edema.  Neuro: Alert and oriented X 3. Psych: Answers questions appropriately.   Labs: Basic Metabolic Panel: Recent Labs    08/18/23 1741 08/20/23 0528  NA 138 138  K 3.8 4.2  CL 100 103  CO2 29 25  GLUCOSE 126* 117*  BUN 34* 28*  CREATININE 1.29* 1.06  CALCIUM 9.2 9.1   Liver Function Tests: No results for input(s): "AST", "ALT", "ALKPHOS", "BILITOT", "PROT", "ALBUMIN" in the last 72 hours. No results for input(s): "LIPASE", "AMYLASE" in the last 72 hours. CBC: Recent Labs    08/18/23 1741 08/20/23 0528  WBC 6.3 7.0  HGB 14.7 14.9  HCT 45.0 44.3  MCV 97.0 95.3  PLT 170 154   Cardiac Enzymes: Recent Labs    08/18/23 1741 08/18/23 2025  TROPONINIHS 18* 18*   BNP: No results for input(s): "BNP" in the last 72 hours. D-Dimer: Recent Labs    08/19/23 0539  DDIMER 1.57*   Hemoglobin A1C: Recent Labs    08/19/23 0539  HGBA1C 6.5*   Fasting Lipid Panel: No results for input(s): "CHOL", "HDL", "LDLCALC", "TRIG", "CHOLHDL", "LDLDIRECT" in the last 72 hours. Thyroid Function Tests: No results for input(s): "TSH", "T4TOTAL", "T3FREE", "THYROIDAB" in the last 72 hours.  Invalid input(s): "FREET3" Anemia Panel: No  results for input(s): "VITAMINB12", "FOLATE", "FERRITIN", "TIBC", "IRON", "RETICCTPCT" in the last 72 hours.   Radiology: CT Angio Chest Pulmonary Embolism (PE) W or WO Contrast  Result Date: 08/20/2023 CLINICAL DATA:  Hypoxia. EXAM: CT ANGIOGRAPHY CHEST WITH CONTRAST TECHNIQUE: Multidetector CT imaging of the chest was performed using the standard protocol during bolus administration of intravenous contrast. Multiplanar CT image reconstructions and MIPs were obtained to evaluate the vascular anatomy. RADIATION DOSE REDUCTION: This exam was performed according to the departmental dose-optimization program which includes automated exposure control, adjustment of the mA and/or kV according to patient size and/or use of iterative  reconstruction technique. CONTRAST:  75mL OMNIPAQUE IOHEXOL 350 MG/ML SOLN COMPARISON:  August 18, 2023. FINDINGS: Cardiovascular: Satisfactory opacification of the pulmonary arteries to the segmental level. No evidence of pulmonary embolism. Right atrial enlargement is noted. Status post coronary artery bypass graft. Left-sided pacemaker is noted. No pericardial effusion. Mediastinum/Nodes: No enlarged mediastinal, hilar, or axillary lymph nodes. Thyroid gland, trachea, and esophagus demonstrate no significant findings. Lungs/Pleura: No pneumothorax or pleural effusion is noted. Minimal bibasilar subsegmental atelectasis or scarring is noted. Upper Abdomen: No acute abnormality. Musculoskeletal: No chest wall abnormality. No acute or significant osseous findings. Review of the MIP images confirms the above findings. IMPRESSION: No definite evidence of pulmonary embolus. Right atrial enlargement is noted as well as enlargement of IVC and hepatic veins. Status post coronary artery bypass graft. Minimal bibasilar subsegmental atelectasis or scarring. Aortic Atherosclerosis (ICD10-I70.0). Electronically Signed   By: Lupita Raider M.D.   On: 08/20/2023 11:59   CT Head Wo  Contrast  Result Date: 08/18/2023 CLINICAL DATA:  Syncope. EXAM: CT HEAD WITHOUT CONTRAST CT CERVICAL SPINE WITHOUT CONTRAST TECHNIQUE: Multidetector CT imaging of the head and cervical spine was performed following the standard protocol without intravenous contrast. Multiplanar CT image reconstructions of the cervical spine were also generated. RADIATION DOSE REDUCTION: This exam was performed according to the departmental dose-optimization program which includes automated exposure control, adjustment of the mA and/or kV according to patient size and/or use of iterative reconstruction technique. COMPARISON:  Head CT dated 02/24/2010. FINDINGS: CT HEAD FINDINGS Brain: Mild age-related atrophy and chronic microvascular ischemic changes. There is no acute intracranial hemorrhage. No mass effect or midline shift. No extra-axial fluid collection. Vascular: No hyperdense vessel or unexpected calcification. Skull: Normal. Negative for fracture or focal lesion. Sinuses/Orbits: No acute finding. Other: None CT CERVICAL SPINE FINDINGS Alignment: No acute subluxation. Skull base and vertebrae: No acute fracture.  Osteopenia. Soft tissues and spinal canal: No prevertebral fluid or swelling. No visible canal hematoma. Disc levels:  No acute findings.  Degenerative changes. Upper chest: Negative. Other: Bilateral carotid bulb calcified plaques. Partially visualized left pacemaker device. IMPRESSION: 1. No acute intracranial pathology. Mild age-related atrophy and chronic microvascular ischemic changes. 2. No acute/traumatic cervical spine pathology. Electronically Signed   By: Elgie Collard M.D.   On: 08/18/2023 22:34   CT Cervical Spine Wo Contrast  Result Date: 08/18/2023 CLINICAL DATA:  Syncope. EXAM: CT HEAD WITHOUT CONTRAST CT CERVICAL SPINE WITHOUT CONTRAST TECHNIQUE: Multidetector CT imaging of the head and cervical spine was performed following the standard protocol without intravenous contrast. Multiplanar CT  image reconstructions of the cervical spine were also generated. RADIATION DOSE REDUCTION: This exam was performed according to the departmental dose-optimization program which includes automated exposure control, adjustment of the mA and/or kV according to patient size and/or use of iterative reconstruction technique. COMPARISON:  Head CT dated 02/24/2010. FINDINGS: CT HEAD FINDINGS Brain: Mild age-related atrophy and chronic microvascular ischemic changes. There is no acute intracranial hemorrhage. No mass effect or midline shift. No extra-axial fluid collection. Vascular: No hyperdense vessel or unexpected calcification. Skull: Normal. Negative for fracture or focal lesion. Sinuses/Orbits: No acute finding. Other: None CT CERVICAL SPINE FINDINGS Alignment: No acute subluxation. Skull base and vertebrae: No acute fracture.  Osteopenia. Soft tissues and spinal canal: No prevertebral fluid or swelling. No visible canal hematoma. Disc levels:  No acute findings.  Degenerative changes. Upper chest: Negative. Other: Bilateral carotid bulb calcified plaques. Partially visualized left pacemaker device. IMPRESSION: 1. No acute intracranial pathology. Mild  age-related atrophy and chronic microvascular ischemic changes. 2. No acute/traumatic cervical spine pathology. Electronically Signed   By: Elgie Collard M.D.   On: 08/18/2023 22:34   DG Chest 2 View  Result Date: 08/18/2023 CLINICAL DATA:  Hypoxia, fall. EXAM: CHEST - 2 VIEW COMPARISON:  March 28, 2009. FINDINGS: Stable cardiomediastinal silhouette. Status post coronary artery bypass graft. Left-sided pacemaker is unchanged. Lungs are clear. Bony thorax is unremarkable. IMPRESSION: No active cardiopulmonary disease. Electronically Signed   By: Lupita Raider M.D.   On: 08/18/2023 20:33    ECHO pending  TELEMETRY reviewed by me 08/20/2023: Ventricular pacing with PVCs rate 80s  EKG reviewed by me: Regular paced rhythm with PVCs rate 77 bpm  Data reviewed  by me 08/20/2023: last 24h vitals tele labs imaging I/O hospitalist progress note  Principal Problem:   Syncope and collapse Active Problems:   AKI (acute kidney injury) (HCC)   Generalized weakness   Long term (current) use of warfarin   History of TIAs   SSS (sick sinus syndrome) (HCC)   Severe tricuspid valve insufficiency   Peripheral vascular disease (HCC)   Moderate mitral insufficiency   Controlled type 2 diabetes mellitus with complication, without long-term current use of insulin (HCC)   Chronic systolic CHF (congestive heart failure), NYHA class 2 (HCC)   Presence of permanent cardiac pacemaker   Hx of CABG x 5   Coronary artery disease s/p CABG x 5   Subtherapeutic international normalized ratio (INR)    ASSESSMENT AND PLAN:  OMNI HELLMICH is a 89 y.o. male  with a past medical history of coronary artery disease s/p CABG x 5, sick sinus syndrome s/p PPM, with generator change 05/2020, atrial fibrillation on warfarin, ischemic cardiomyopathy, chronic HFrEF, hypertension, hyperlipidemia, history of TIA who presented to the ED on 08/19/2023 for syncope and fall. Cardiology was consulted for further evaluation.   # Syncope # CAD s/p CABG x5 # SSS s/p PPM # Atrial fibrillation Presented to the ED after episode of syncope yesterday evening.  No prodromal symptoms, loss of consciousness only a few seconds.  Episode witnessed by wife.  EKG with ventricular paced rhythm and PVCs.  CT head without acute abnormality.  Troponins mildly elevated and flat at 18 > 18. CTA chest today without PE.  -Device interrogation with no recent events to explain episode of syncope.  No identifiable cardiac cause of syncope. -Plan to transition to eliquis today, patient tolerated this previously but had to switch to warfarin due to cost. Now gets meds at [facility] so should be well covered.  -Plan for further cardiac diagnostics at this time.  # Generalized weakness # AKI Patient and wife reporting  weakness since last night, patient reportedly felt unsteady when he had his syncopal episode.  Now reporting that his right arm feels "floppy".  Creatinine 1.29 on admission up from baseline around 1, improved this AM. -Continue to monitor symptoms. -Continue to monitor renal function closely.   This patient's plan of care was discussed and created with Dr. Darrold Junker and he is in agreement.  Signed: Gale Journey, PA-C  08/20/2023, 1:11 PM [facility] Cardiology

## 2023-08-20 NOTE — Plan of Care (Signed)
Problem: Education: Goal: Ability to describe self-care measures that may prevent or decrease complications (Diabetes Survival Skills Education) will improve Outcome: Progressing Goal: Individualized Educational Video(s) Outcome: Progressing   Problem: Coping: Goal: Ability to adjust to condition or change in health will improve Outcome: Progressing   Problem: Fluid Volume: Goal: Ability to maintain a balanced intake and output will improve Outcome: Progressing   Problem: Health Behavior/Discharge Planning: Goal: Ability to identify and utilize available resources and services will improve Outcome: Progressing Goal: Ability to manage health-related needs will improve Outcome: Progressing   Problem: Metabolic: Goal: Ability to maintain appropriate glucose levels will improve Outcome: Progressing   Problem: Nutritional: Goal: Maintenance of adequate nutrition will improve Outcome: Progressing Goal: Progress toward achieving an optimal weight will improve Outcome: Progressing   Problem: Skin Integrity: Goal: Risk for impaired skin integrity will decrease Outcome: Progressing   Problem: Tissue Perfusion: Goal: Adequacy of tissue perfusion will improve Outcome: Progressing   Problem: Education: Goal: Knowledge of condition and prescribed therapy will improve Outcome: Progressing   Problem: Cardiac: Goal: Will achieve and/or maintain adequate cardiac output Outcome: Progressing   Problem: Physical Regulation: Goal: Complications related to the disease process, condition or treatment will be avoided or minimized Outcome: Progressing   Problem: Education: Goal: Knowledge of General Education information will improve Description: Including pain rating scale, medication(s)/side effects and non-pharmacologic comfort measures Outcome: Progressing   Problem: Health Behavior/Discharge Planning: Goal: Ability to manage health-related needs will improve Outcome:  Progressing   Problem: Clinical Measurements: Goal: Ability to maintain clinical measurements within normal limits will improve Outcome: Progressing Goal: Will remain free from infection Outcome: Progressing Goal: Diagnostic test results will improve Outcome: Progressing Goal: Respiratory complications will improve Outcome: Progressing Goal: Cardiovascular complication will be avoided Outcome: Progressing   Problem: Activity: Goal: Risk for activity intolerance will decrease Outcome: Progressing   Problem: Nutrition: Goal: Adequate nutrition will be maintained Outcome: Progressing   Problem: Coping: Goal: Level of anxiety will decrease Outcome: Progressing   Problem: Elimination: Goal: Will not experience complications related to bowel motility Outcome: Progressing Goal: Will not experience complications related to urinary retention Outcome: Progressing   Problem: Pain Management: Goal: General experience of comfort will improve Outcome: Progressing   Problem: Safety: Goal: Ability to remain free from injury will improve Outcome: Progressing   Problem: Skin Integrity: Goal: Risk for impaired skin integrity will decrease Outcome: Progressing

## 2023-08-20 NOTE — Telephone Encounter (Signed)
Patient Product/process development scientist completed.    The patient is insured through [employer]. Patient has Medicare and is not eligible for a copay card, but may be able to apply for patient assistance, if available.    Ran test claim for Eliquis 5 mg and the current 30 day co-pay is $40.00.  Ran test claim for Xarelto 20 mg and the current 30 day co-pay is $40.00.  This test claim was processed through [facility]- copay amounts may vary at other pharmacies due to pharmacy/plan contracts, or as the patient moves through the different stages of their insurance plan.     Roland Earl, CPHT Pharmacy Technician III Certified Patient Advocate [facility] Patient Advocate Team Direct Number: [phone]  Fax: [phone]

## 2023-08-21 DIAGNOSIS — W19XXXA Unspecified fall, initial encounter: Secondary | ICD-10-CM

## 2023-08-21 DIAGNOSIS — R531 Weakness: Secondary | ICD-10-CM | POA: Diagnosis not present

## 2023-08-21 DIAGNOSIS — N179 Acute kidney failure, unspecified: Secondary | ICD-10-CM | POA: Diagnosis not present

## 2023-08-21 DIAGNOSIS — R55 Syncope and collapse: Secondary | ICD-10-CM | POA: Diagnosis not present

## 2023-08-21 LAB — PROTIME-INR
INR: 1.6 — ABNORMAL HIGH (ref 0.8–1.2)
Prothrombin Time: 18.8 s — ABNORMAL HIGH (ref 11.4–15.2)

## 2023-08-21 LAB — GLUCOSE, CAPILLARY
Glucose-Capillary: 100 mg/dL — ABNORMAL HIGH (ref 70–99)
Glucose-Capillary: 106 mg/dL — ABNORMAL HIGH (ref 70–99)

## 2023-08-21 MED ORDER — METOPROLOL SUCCINATE ER 50 MG PO TB24: 50.0000 mg | ORAL_TABLET | Freq: Every day | ORAL | 0 refills | Status: DC

## 2023-08-21 MED ORDER — APIXABAN 5 MG PO TABS: 5.0000 mg | ORAL_TABLET | Freq: Two times a day (BID) | ORAL | 0 refills | Status: AC

## 2023-08-21 NOTE — Discharge Summary (Signed)
Physician Discharge Summary   Patient: Bryan Singleton MRN: [id number] DOB: 1932/07/28  Admit date:     08/19/2023  Discharge date: 08/21/23  Discharge Physician: Delfino Lovett   PCP: Lynnea Ferrier, MD   Recommendations at discharge:   Follow-up with outpatient providers as requested  Discharge Diagnoses: Principal Problem:   Syncope and collapse Active Problems:   AKI (acute kidney injury) (HCC)   Generalized weakness   SSS (sick sinus syndrome) (HCC)   Severe tricuspid valve insufficiency   Chronic systolic CHF (congestive heart failure), NYHA class 2 (HCC)   Presence of permanent cardiac pacemaker   Moderate mitral insufficiency   Controlled type 2 diabetes mellitus with complication, without long-term current use of insulin (HCC)   Hx of CABG x 5   Long term (current) use of warfarin   History of TIAs   Peripheral vascular disease (HCC)   Coronary artery disease s/p CABG x 5   Subtherapeutic international normalized ratio (INR)   [facility] Course: Assessment and Plan:  89 y.o. male with medical history significant for Multiple medical problems being admitted with a syncopal episode resulting in a fall sustaining an abrasion to the right forearm.   11/21: Cardiology and palliative care consult 11/22: Increasing Farxiga dose switching Coumadin to Eliquis, CT angio ruled out PE     Assessment & Plan:   * Syncope and collapse Uncertain etiology.  He has not had any further syncope while in the hospital CT head nonacute CT angio ruled out PE -Unable to get MRI with a pacer.  Could consider as an outpatient after evaluation by neurology -EKG primarily showing PVCs and ventricular paced rhythm. Troponin mildly elevated not suggestive of MI and likely demand ischemia -Pacer interrogation showing no recent event to explain the episode of syncope.   Generalized weakness Possibly related to dehydration Received an IV fluid bolus PT eval -recommends home health  PT which has been set up   AKI (acute kidney injury) (HCC) Creatinine 1.29 up from baseline of 1.0, suspect ATN related to dehydration from diuretics Now resolved with hydration   SSS (sick sinus syndrome) (HCC) S/p pacemaker Atrial fibrillation on Coumadin with subtherapeutic INR on admission Severe tricuspid valve insufficiency and moderate mitral insufficiency Continuous telemetry monitoring Echocardiogram unchanged.  Normal LV systolic function -discussed with pharmacy.  Will increase the dose of [facility] following Switching Coumadin to Eliquis this admission.  Patient and family in agreement.   Controlled type 2 diabetes mellitus with complication, without long-term current use of insulin (HCC)   Coronary artery disease s/p CABG x 5 PVD Continue metoprolol, atorvastatin, lisinopril, Eliquis   History of TIAs Peripheral vascular disease Continue Eliquis and statin           Consultants: Cardiology Disposition: Home health Diet recommendation:  Discharge Diet Orders (From admission, onward)     Start     Ordered   08/21/23 0000  Diet - low sodium heart healthy        08/21/23 1300           Carb modified diet DISCHARGE MEDICATION: Allergies as of 08/21/2023       Reactions   Allegra Allergy [fexofenadine Hcl]    Penicillins    Prednisone    Shellfish Allergy         Medication List     STOP taking these medications    warfarin 4 MG tablet Commonly known as: COUMADIN       TAKE these  medications    apixaban 5 MG Tabs tablet Commonly known as: ELIQUIS Take 1 tablet (5 mg total) by mouth 2 (two) times daily.   atorvastatin 10 MG tablet Commonly known as: LIPITOR Take 10 mg by mouth daily.   camphor-menthol lotion Commonly known as: SARNA Apply 1 Application topically as needed.   dapagliflozin propanediol 10 MG Tabs tablet Commonly known as: FARXIGA Take 5 mg by mouth daily.   furosemide 40 MG tablet Commonly known as:  LASIX Take 40 mg by mouth.   gabapentin 100 MG capsule Commonly known as: NEURONTIN Take 100 mg by mouth 3 (three) times daily.   imiquimod 5 % cream Commonly known as: ALDARA Apply 1 Application topically at bedtime.   levothyroxine 25 MCG tablet Commonly known as: SYNTHROID Take 25 mcg by mouth daily before breakfast.   lisinopril 5 MG tablet Commonly known as: ZESTRIL Take 5 mg by mouth daily.   metoprolol succinate 50 MG 24 hr tablet Commonly known as: TOPROL-XL Take 1 tablet (50 mg total) by mouth daily. Take with or immediately following a meal. Start taking on: August 22, 2023 What changed:  medication strength how much to take additional instructions        Follow-up Information     Callwood, Dwayne D, MD. Go in 1 week(s).   Specialties: Cardiology, Internal Medicine Contact information: [address] [place] [address] [phone]         Curtis Sites III, MD. Schedule an appointment as soon as possible for a visit in 1 week(s).   Specialty: Internal Medicine Why: [facility] Discharge F/UP Contact information: [address] [facility] [place] [address] [phone]         Lonell Face, MD. Schedule an appointment as soon as possible for a visit in 2 week(s).   Specialty: Neurology Why: [facility] Discharge F/UP Contact information: [address] [facility] [place] [address] [phone]                Discharge Exam: Ceasar Mons Weights   08/18/23 1728 08/19/23 2100  Weight: 81.6 kg 79.5 kg   General exam: Appears calm and comfortable  Respiratory system: Clear to auscultation. Respiratory effort normal. Cardiovascular system: S1 & S2 heard, RRR. No JVD, murmurs, rubs, gallops or clicks. No pedal edema. Gastrointestinal system: Abdomen is nondistended, soft and nontender. No organomegaly or masses felt. Normal bowel sounds heard. Central nervous system:  Alert and oriented. No focal neurological deficits. Extremities: Symmetric 5 x 5 power. Skin: No rashes, lesions or ulcers Psychiatry: Judgement and insight appear normal. Mood & affect appropriate.     Condition at discharge: good  The results of significant diagnostics from this hospitalization (including imaging, microbiology, ancillary and laboratory) are listed below for reference.   Imaging Studies: ECHOCARDIOGRAM COMPLETE  Result Date: 08/20/2023    ECHOCARDIOGRAM REPORT   Patient Name:   MURPHY MARG Date of Exam: 08/19/2023 Medical Rec #:  [id number]         Height:       74.0 in Accession #:    [id number]        Weight:       180.0 lb Date of Birth:  01/12/1932         BSA:          2.078 m Patient Age:    89 years          BP:  139/97 mmHg Patient Gender: M                 HR:           87 bpm. Exam Location:  [facility] Procedure: 2D Echo, Cardiac Doppler and Color Doppler Indications:     Syncope  History:         Patient has no prior history of Echocardiogram examinations.                  CHF, CAD, Prior CABG and Pacemaker, TIA,                  Signs/Symptoms:Syncope; Risk Factors:Diabetes.  Sonographer:     Mikki Harbor Referring Phys:  [id number] Andris Baumann Diagnosing Phys: Marcina Millard MD  Sonographer Comments: Technically difficult study due to poor echo windows and suboptimal apical window. Image acquisition challenging due to respiratory motion. IMPRESSIONS  1. Left ventricular ejection fraction, by estimation, is 50 to 55%. The left ventricle has low normal function. The left ventricle has no regional wall motion abnormalities. Indeterminate diastolic filling due to E-A fusion.  2. Right ventricular systolic function is normal. The right ventricular size is normal. There is mildly elevated pulmonary artery systolic pressure.  3. The mitral valve is normal in structure. Mild mitral valve regurgitation. No evidence of mitral stenosis.  4. Tricuspid valve  regurgitation is mild to moderate.  5. The aortic valve is normal in structure. Aortic valve regurgitation is not visualized. Mild aortic valve stenosis.  6. The inferior vena cava is normal in size with greater than 50% respiratory variability, suggesting right atrial pressure of 3 mmHg. FINDINGS  Left Ventricle: Left ventricular ejection fraction, by estimation, is 50 to 55%. The left ventricle has low normal function. The left ventricle has no regional wall motion abnormalities. The left ventricular internal cavity size was normal in size. There is no left ventricular hypertrophy. Indeterminate diastolic filling due to E-A fusion. Right Ventricle: The right ventricular size is normal. No increase in right ventricular wall thickness. Right ventricular systolic function is normal. There is mildly elevated pulmonary artery systolic pressure. The tricuspid regurgitant velocity is 2.50  m/s, and with an assumed right atrial pressure of 15 mmHg, the estimated right ventricular systolic pressure is 40.0 mmHg. Left Atrium: Left atrial size was normal in size. Right Atrium: Right atrial size was normal in size. Pericardium: There is no evidence of pericardial effusion. Mitral Valve: The mitral valve is normal in structure. Mild mitral valve regurgitation. No evidence of mitral valve stenosis. MV peak gradient, 2.5 mmHg. The mean mitral valve gradient is 1.0 mmHg. Tricuspid Valve: The tricuspid valve is normal in structure. Tricuspid valve regurgitation is mild to moderate. No evidence of tricuspid stenosis. Aortic Valve: The aortic valve is normal in structure. Aortic valve regurgitation is not visualized. Mild aortic stenosis is present. Aortic valve mean gradient measures 5.0 mmHg. Aortic valve peak gradient measures 10.6 mmHg. Aortic valve area, by VTI measures 1.05 cm. Pulmonic Valve: The pulmonic valve was normal in structure. Pulmonic valve regurgitation is not visualized. No evidence of pulmonic stenosis. Aorta:  The aortic root is normal in size and structure. Venous: The inferior vena cava is normal in size with greater than 50% respiratory variability, suggesting right atrial pressure of 3 mmHg. IAS/Shunts: No atrial level shunt detected by color flow Doppler. Additional Comments: A device lead is visualized.  LEFT VENTRICLE PLAX 2D LVIDd:         5.10 cm  LVIDs:         3.80 cm LV PW:         1.20 cm LV IVS:        1.40 cm LVOT diam:     2.00 cm LV SV:         38 LV SV Index:   18 LVOT Area:     3.14 cm  RIGHT VENTRICLE RV Basal diam:  6.00 cm RV Mid diam:    4.70 cm LEFT ATRIUM           Index        RIGHT ATRIUM           Index LA diam:      4.40 cm 2.12 cm/m   RA Area:     37.30 cm LA Vol (A4C): 54.6 ml 26.27 ml/m  RA Volume:   173.00 ml 83.24 ml/m  AORTIC VALVE                     PULMONIC VALVE AV Area (Vmax):    1.24 cm      PV Vmax:       0.85 m/s AV Area (Vmean):   1.18 cm      PV Peak grad:  2.9 mmHg AV Area (VTI):     1.05 cm AV Vmax:           163.00 cm/s AV Vmean:          101.750 cm/s AV VTI:            0.361 m AV Peak Grad:      10.6 mmHg AV Mean Grad:      5.0 mmHg LVOT Vmax:         64.30 cm/s LVOT Vmean:        38.350 cm/s LVOT VTI:          0.120 m LVOT/AV VTI ratio: 0.33  AORTA Ao Root diam: 3.80 cm MITRAL VALVE               TRICUSPID VALVE MV Area (PHT): 3.20 cm    TR Peak grad:   25.0 mmHg MV Area VTI:   1.59 cm    TR Vmax:        250.00 cm/s MV Peak grad:  2.5 mmHg MV Mean grad:  1.0 mmHg    SHUNTS MV Vmax:       0.80 m/s    Systemic VTI:  0.12 m MV Vmean:      40.2 cm/s   Systemic Diam: 2.00 cm MV Decel Time: 237 msec MV E velocity: 78.60 cm/s Marcina Millard MD Electronically signed by Marcina Millard MD Signature Date/Time: 08/20/2023/3:54:14 PM    Final    CT Angio Chest Pulmonary Embolism (PE) W or WO Contrast  Result Date: 08/20/2023 CLINICAL DATA:  Hypoxia. EXAM: CT ANGIOGRAPHY CHEST WITH CONTRAST TECHNIQUE: Multidetector CT imaging of the chest was performed using the  standard protocol during bolus administration of intravenous contrast. Multiplanar CT image reconstructions and MIPs were obtained to evaluate the vascular anatomy. RADIATION DOSE REDUCTION: This exam was performed according to the departmental dose-optimization program which includes automated exposure control, adjustment of the mA and/or kV according to patient size and/or use of iterative reconstruction technique. CONTRAST:  75mL OMNIPAQUE IOHEXOL 350 MG/ML SOLN COMPARISON:  August 18, 2023. FINDINGS: Cardiovascular: Satisfactory opacification of the pulmonary arteries to the segmental level. No evidence of pulmonary embolism. Right atrial enlargement is noted. Status post coronary  artery bypass graft. Left-sided pacemaker is noted. No pericardial effusion. Mediastinum/Nodes: No enlarged mediastinal, hilar, or axillary lymph nodes. Thyroid gland, trachea, and esophagus demonstrate no significant findings. Lungs/Pleura: No pneumothorax or pleural effusion is noted. Minimal bibasilar subsegmental atelectasis or scarring is noted. Upper Abdomen: No acute abnormality. Musculoskeletal: No chest wall abnormality. No acute or significant osseous findings. Review of the MIP images confirms the above findings. IMPRESSION: No definite evidence of pulmonary embolus. Right atrial enlargement is noted as well as enlargement of IVC and hepatic veins. Status post coronary artery bypass graft. Minimal bibasilar subsegmental atelectasis or scarring. Aortic Atherosclerosis (ICD10-I70.0). Electronically Signed   By: Lupita Raider M.D.   On: 08/20/2023 11:59   CT Head Wo Contrast  Result Date: 08/18/2023 CLINICAL DATA:  Syncope. EXAM: CT HEAD WITHOUT CONTRAST CT CERVICAL SPINE WITHOUT CONTRAST TECHNIQUE: Multidetector CT imaging of the head and cervical spine was performed following the standard protocol without intravenous contrast. Multiplanar CT image reconstructions of the cervical spine were also generated. RADIATION  DOSE REDUCTION: This exam was performed according to the departmental dose-optimization program which includes automated exposure control, adjustment of the mA and/or kV according to patient size and/or use of iterative reconstruction technique. COMPARISON:  Head CT dated 02/24/2010. FINDINGS: CT HEAD FINDINGS Brain: Mild age-related atrophy and chronic microvascular ischemic changes. There is no acute intracranial hemorrhage. No mass effect or midline shift. No extra-axial fluid collection. Vascular: No hyperdense vessel or unexpected calcification. Skull: Normal. Negative for fracture or focal lesion. Sinuses/Orbits: No acute finding. Other: None CT CERVICAL SPINE FINDINGS Alignment: No acute subluxation. Skull base and vertebrae: No acute fracture.  Osteopenia. Soft tissues and spinal canal: No prevertebral fluid or swelling. No visible canal hematoma. Disc levels:  No acute findings.  Degenerative changes. Upper chest: Negative. Other: Bilateral carotid bulb calcified plaques. Partially visualized left pacemaker device. IMPRESSION: 1. No acute intracranial pathology. Mild age-related atrophy and chronic microvascular ischemic changes. 2. No acute/traumatic cervical spine pathology. Electronically Signed   By: Elgie Collard M.D.   On: 08/18/2023 22:34   CT Cervical Spine Wo Contrast  Result Date: 08/18/2023 CLINICAL DATA:  Syncope. EXAM: CT HEAD WITHOUT CONTRAST CT CERVICAL SPINE WITHOUT CONTRAST TECHNIQUE: Multidetector CT imaging of the head and cervical spine was performed following the standard protocol without intravenous contrast. Multiplanar CT image reconstructions of the cervical spine were also generated. RADIATION DOSE REDUCTION: This exam was performed according to the departmental dose-optimization program which includes automated exposure control, adjustment of the mA and/or kV according to patient size and/or use of iterative reconstruction technique. COMPARISON:  Head CT dated 02/24/2010.  FINDINGS: CT HEAD FINDINGS Brain: Mild age-related atrophy and chronic microvascular ischemic changes. There is no acute intracranial hemorrhage. No mass effect or midline shift. No extra-axial fluid collection. Vascular: No hyperdense vessel or unexpected calcification. Skull: Normal. Negative for fracture or focal lesion. Sinuses/Orbits: No acute finding. Other: None CT CERVICAL SPINE FINDINGS Alignment: No acute subluxation. Skull base and vertebrae: No acute fracture.  Osteopenia. Soft tissues and spinal canal: No prevertebral fluid or swelling. No visible canal hematoma. Disc levels:  No acute findings.  Degenerative changes. Upper chest: Negative. Other: Bilateral carotid bulb calcified plaques. Partially visualized left pacemaker device. IMPRESSION: 1. No acute intracranial pathology. Mild age-related atrophy and chronic microvascular ischemic changes. 2. No acute/traumatic cervical spine pathology. Electronically Signed   By: Elgie Collard M.D.   On: 08/18/2023 22:34   DG Chest 2 View  Result Date: 08/18/2023 CLINICAL DATA:  Hypoxia, fall. EXAM: CHEST - 2 VIEW COMPARISON:  March 28, 2009. FINDINGS: Stable cardiomediastinal silhouette. Status post coronary artery bypass graft. Left-sided pacemaker is unchanged. Lungs are clear. Bony thorax is unremarkable. IMPRESSION: No active cardiopulmonary disease. Electronically Signed   By: Lupita Raider M.D.   On: 08/18/2023 20:33    Microbiology: Results for orders placed or performed during the hospital encounter of 08/19/23  Resp panel by RT-PCR (RSV, Flu A&B, Covid) Anterior Nasal Swab     Status: None   Collection Time: 08/19/23 12:52 AM   Specimen: Anterior Nasal Swab  Result Value Ref Range Status   SARS Coronavirus 2 by RT PCR NEGATIVE NEGATIVE Final    Comment: (NOTE) SARS-CoV-2 target nucleic acids are NOT DETECTED.  The SARS-CoV-2 RNA is generally detectable in upper respiratory specimens during the acute phase of infection. The  lowest concentration of SARS-CoV-2 viral copies this assay can detect is 138 copies/mL. A negative result does not preclude SARS-Cov-2 infection and should not be used as the sole basis for treatment or other patient management decisions. A negative result may occur with  improper specimen collection/handling, submission of specimen other than nasopharyngeal swab, presence of viral mutation(s) within the areas targeted by this assay, and inadequate number of viral copies(<138 copies/mL). A negative result must be combined with clinical observations, patient history, and epidemiological information. The expected result is Negative.  Fact Sheet for Patients:  BloggerCourse.com  Fact Sheet for Healthcare Providers:  SeriousBroker.it  This test is no t yet approved or cleared by the [place] FDA and  has been authorized for detection and/or diagnosis of SARS-CoV-2 by FDA under an Emergency Use Authorization (EUA). This EUA will remain  in effect (meaning this test can be used) for the duration of the COVID-19 declaration under Section 564(b)(1) of the Act, 21 U.S.C.section 360bbb-3(b)(1), unless the authorization is terminated  or revoked sooner.       Influenza A by PCR NEGATIVE NEGATIVE Final   Influenza B by PCR NEGATIVE NEGATIVE Final    Comment: (NOTE) The Xpert Xpress SARS-CoV-2/FLU/RSV plus assay is intended as an aid in the diagnosis of influenza from Nasopharyngeal swab specimens and should not be used as a sole basis for treatment. Nasal washings and aspirates are unacceptable for Xpert Xpress SARS-CoV-2/FLU/RSV testing.  Fact Sheet for Patients: BloggerCourse.com  Fact Sheet for Healthcare Providers: SeriousBroker.it  This test is not yet approved or cleared by the [place] FDA and has been authorized for detection and/or diagnosis of SARS-CoV-2 by FDA under  an Emergency Use Authorization (EUA). This EUA will remain in effect (meaning this test can be used) for the duration of the COVID-19 declaration under Section 564(b)(1) of the Act, 21 U.S.C. section 360bbb-3(b)(1), unless the authorization is terminated or revoked.     Resp Syncytial Virus by PCR NEGATIVE NEGATIVE Final    Comment: (NOTE) Fact Sheet for Patients: BloggerCourse.com  Fact Sheet for Healthcare Providers: SeriousBroker.it  This test is not yet approved or cleared by the [place] FDA and has been authorized for detection and/or diagnosis of SARS-CoV-2 by FDA under an Emergency Use Authorization (EUA). This EUA will remain in effect (meaning this test can be used) for the duration of the COVID-19 declaration under Section 564(b)(1) of the Act, 21 U.S.C. section 360bbb-3(b)(1), unless the authorization is terminated or revoked.  Performed at [facility], [address] [place], [place] [address]     Labs: CBC: Recent Labs  Lab 08/18/23 1741 08/20/23  0528  WBC 6.3 7.0  HGB 14.7 14.9  HCT 45.0 44.3  MCV 97.0 95.3  PLT 170 154   Basic Metabolic Panel: Recent Labs  Lab 08/18/23 1741 08/20/23 0528  NA 138 138  K 3.8 4.2  CL 100 103  CO2 29 25  GLUCOSE 126* 117*  BUN 34* 28*  CREATININE 1.29* 1.06  CALCIUM 9.2 9.1   Liver Function Tests: No results for input(s): "AST", "ALT", "ALKPHOS", "BILITOT", "PROT", "ALBUMIN" in the last 168 hours. CBG: Recent Labs  Lab 08/20/23 0918 08/20/23 1548 08/20/23 2141 08/21/23 0738 08/21/23 1135  GLUCAP 85 158* 136* 100* 106*    Discharge time spent: greater than 30 minutes.  Signed: Delfino Lovett, MD [facility] 08/21/2023

## 2023-08-21 NOTE — Progress Notes (Signed)
Physical Therapy Treatment Patient Details Name: Bryan Singleton MRN: [id number] DOB: [birth year] Today's Date: 08/21/2023   History of Present Illness Patient is a 89 year old male with syncope and collapse with right arm abrasion. History of COPD, CAD s/p CABG x 5, HTN, SSS s/p pacemaker, PVD, CHF, TIA.    PT Comments  Pt received in recliner with wife at bedside and agreed to PT session. Pt reports strength in arm is feeling better, however weakness in RLE is still noticed. Pt performed STS with the use of RW (2wheels) CGA, and amb with RW in hallway CGA. Pt amb ~17ft to finish session in recliner without the use of AD where they demonstrated less confidence in balance and decreased stability. While amb with and without AD, pt expressed RLE felt like jello where it feels less stable and is more difficult to lift off of the ground with each step. Pt tolerated Tx well today and will continue benefit from skilled PT sessions to improve functional mobility and strength to maximize safety/return to PLOF following D/C.    If plan is discharge home, recommend the following: A little help with walking and/or transfers;A little help with bathing/dressing/bathroom;Assist for transportation;Help with stairs or ramp for entrance   Can travel by private vehicle        Equipment Recommendations  Rolling walker (2 wheels)    Recommendations for Other Services       Precautions / Restrictions Precautions Precautions: Fall Restrictions Weight Bearing Restrictions: No     Mobility  Bed Mobility               General bed mobility comments: Pt received in recliner, bed mobility not observed    Transfers Overall transfer level: Needs assistance Equipment used: Rolling walker (2 wheels) Transfers: Sit to/from Stand Sit to Stand: Contact guard assist           General transfer comment: Pt performed STS with the use of RW (2wheels) and did not report any s/sx relative to dizziness when  standing.    Ambulation/Gait Ambulation/Gait assistance: Contact guard assist Gait Distance (Feet): 210 Feet Assistive device: Rolling walker (2 wheels) Gait Pattern/deviations: Decreased stance time - right, Decreased step length - right Gait velocity: decreased     General Gait Details: Pt amb with the use of RW (2wheels) CGA and demonstrated heavy reliance on rolling walker for support with ambulation. Pt amb ~80ft to room without the use of AD and demonstrated decreased confidence in balance.   Stairs             Wheelchair Mobility     Tilt Bed    Modified Rankin (Stroke Patients Only)       Balance Overall balance assessment: Needs assistance Sitting-balance support: Feet supported Sitting balance-Leahy Scale: Good     Standing balance support: Bilateral upper extremity supported, During functional activity, Reliant on assistive device for balance Standing balance-Leahy Scale: Fair                              Cognition Arousal: Alert Behavior During Therapy: WFL for tasks assessed/performed Overall Cognitive Status: Within Functional Limits for tasks assessed                                 General Comments: AOx4. Pt pleasant and willing to participate in PT session.  Exercises      General Comments General comments (skin integrity, edema, etc.): Pt hard of hearing. Need to speak loudly and directly to patient.      Pertinent Vitals/Pain Pain Assessment Pain Assessment: Faces Faces Pain Scale: Hurts a little bit Pain Location: RUE from fall (elbow and shoulder with mobility) and proximal RLE Pain Descriptors / Indicators: Aching, Numbness Pain Intervention(s): Monitored during session    Home Living                          Prior Function            PT Goals (current goals can now be found in the care plan section) Acute Rehab PT Goals Patient Stated Goal: to go home soon PT Goal Formulation:  With patient/family Time For Goal Achievement: 09/03/23 Potential to Achieve Goals: Good Progress towards PT goals: Progressing toward goals    Frequency    Min 1X/week      PT Plan      Co-evaluation              AM-PAC PT "6 Clicks" Mobility   Outcome Measure  Help needed turning from your back to your side while in a flat bed without using bedrails?: A Little Help needed moving from lying on your back to sitting on the side of a flat bed without using bedrails?: A Little Help needed moving to and from a bed to a chair (including a wheelchair)?: A Little Help needed standing up from a chair using your arms (e.g., wheelchair or bedside chair)?: A Little Help needed to walk in hospital room?: A Little Help needed climbing 3-5 steps with a railing? : A Lot 6 Click Score: 17    End of Session Equipment Utilized During Treatment: Gait belt Activity Tolerance: Patient tolerated treatment well Patient left: in chair;with call bell/phone within reach;with chair alarm set Nurse Communication: Mobility status PT Visit Diagnosis: Other abnormalities of gait and mobility (R26.89);Difficulty in walking, not elsewhere classified (R26.2)     Time: 1010-1030 PT Time Calculation (min) (ACUTE ONLY): 20 min  Charges:    $Gait Training: 8-22 mins PT General Charges $$ ACUTE PT VISIT: 1 Visit                     Terri Malerba Sauvignon Howard SPT, LAT, ATC   Hershal Eriksson Sauvignon-Howard 08/21/2023, 12:58 PM

## 2023-08-21 NOTE — Plan of Care (Signed)
  Problem: Education: Goal: Ability to describe self-care measures that may prevent or decrease complications (Diabetes Survival Skills Education) will improve Outcome: Progressing Goal: Individualized Educational Video(s) Outcome: Progressing   Problem: Coping: Goal: Ability to adjust to condition or change in health will improve Outcome: Progressing   Problem: Fluid Volume: Goal: Ability to maintain a balanced intake and output will improve Outcome: Progressing   Problem: Health Behavior/Discharge Planning: Goal: Ability to identify and utilize available resources and services will improve Outcome: Progressing Goal: Ability to manage health-related needs will improve Outcome: Progressing   Problem: Metabolic: Goal: Ability to maintain appropriate glucose levels will improve Outcome: Progressing   Problem: Nutritional: Goal: Maintenance of adequate nutrition will improve Outcome: Progressing Goal: Progress toward achieving an optimal weight will improve Outcome: Progressing   Problem: Skin Integrity: Goal: Risk for impaired skin integrity will decrease Outcome: Progressing   Problem: Tissue Perfusion: Goal: Adequacy of tissue perfusion will improve Outcome: Progressing   Problem: Education: Goal: Knowledge of condition and prescribed therapy will improve Outcome: Progressing   Problem: Cardiac: Goal: Will achieve and/or maintain adequate cardiac output Outcome: Progressing   Problem: Physical Regulation: Goal: Complications related to the disease process, condition or treatment will be avoided or minimized Outcome: Progressing   Problem: Education: Goal: Knowledge of General Education information will improve Description: Including pain rating scale, medication(s)/side effects and non-pharmacologic comfort measures Outcome: Progressing   Problem: Health Behavior/Discharge Planning: Goal: Ability to manage health-related needs will improve Outcome:  Progressing   Problem: Clinical Measurements: Goal: Ability to maintain clinical measurements within normal limits will improve Outcome: Progressing Goal: Will remain free from infection Outcome: Progressing Goal: Diagnostic test results will improve Outcome: Progressing Goal: Respiratory complications will improve Outcome: Progressing Goal: Cardiovascular complication will be avoided Outcome: Progressing   Problem: Activity: Goal: Risk for activity intolerance will decrease Outcome: Progressing   Problem: Nutrition: Goal: Adequate nutrition will be maintained Outcome: Progressing   Problem: Coping: Goal: Level of anxiety will decrease Outcome: Progressing   Problem: Elimination: Goal: Will not experience complications related to bowel motility Outcome: Progressing Goal: Will not experience complications related to urinary retention Outcome: Progressing   Problem: Pain Management: Goal: General experience of comfort will improve Outcome: Progressing   Problem: Safety: Goal: Ability to remain free from injury will improve Outcome: Progressing   Problem: Skin Integrity: Goal: Risk for impaired skin integrity will decrease Outcome: Progressing

## 2023-08-21 NOTE — Progress Notes (Signed)
[facility] Cardiology  SUBJECTIVE: Patient laying in bed, denies chest pain, shortness of breath, or recurrent syncope   Vitals:   08/20/23 1943 08/20/23 2329 08/21/23 0442 08/21/23 0736  BP: 127/87 116/72 (!) 93/42 135/86  Pulse: 74  (!) 55 72  Resp: 20 20 18 16   Temp: 98 F (36.7 C) 98 F (36.7 C) 98 F (36.7 C) 98.2 F (36.8 C)  TempSrc:      SpO2: 96%  94% 96%  Weight:      Height:         Intake/Output Summary (Last 24 hours) at 08/21/2023 0956 Last data filed at 08/21/2023 0736 Gross per 24 hour  Intake 960 ml  Output 145 ml  Net 815 ml      PHYSICAL EXAM  General: Well developed, well nourished, in no acute distress HEENT:  Normocephalic and atramatic Neck:  No JVD.  Lungs: Clear bilaterally to auscultation and percussion. Heart: HRRR . Normal S1 and S2 without gallops or murmurs.  Abdomen: Bowel sounds are positive, abdomen soft and non-tender  Msk:  Back normal, normal gait. Normal strength and tone for age. Extremities: No clubbing, cyanosis or edema.   Neuro: Alert and oriented X 3. Psych:  Good affect, responds appropriately   LABS: Basic Metabolic Panel: Recent Labs    08/18/23 1741 08/20/23 0528  NA 138 138  K 3.8 4.2  CL 100 103  CO2 29 25  GLUCOSE 126* 117*  BUN 34* 28*  CREATININE 1.29* 1.06  CALCIUM 9.2 9.1   Liver Function Tests: No results for input(s): "AST", "ALT", "ALKPHOS", "BILITOT", "PROT", "ALBUMIN" in the last 72 hours. No results for input(s): "LIPASE", "AMYLASE" in the last 72 hours. CBC: Recent Labs    08/18/23 1741 08/20/23 0528  WBC 6.3 7.0  HGB 14.7 14.9  HCT 45.0 44.3  MCV 97.0 95.3  PLT 170 154   Cardiac Enzymes: No results for input(s): "CKTOTAL", "CKMB", "CKMBINDEX", "TROPONINI" in the last 72 hours. BNP: Invalid input(s): "POCBNP" D-Dimer: Recent Labs    08/19/23 0539  DDIMER 1.57*   Hemoglobin A1C: Recent Labs    08/19/23 0539  HGBA1C 6.5*   Fasting Lipid Panel: No results for input(s): "CHOL",  "HDL", "LDLCALC", "TRIG", "CHOLHDL", "LDLDIRECT" in the last 72 hours. Thyroid Function Tests: No results for input(s): "TSH", "T4TOTAL", "T3FREE", "THYROIDAB" in the last 72 hours.  Invalid input(s): "FREET3" Anemia Panel: No results for input(s): "VITAMINB12", "FOLATE", "FERRITIN", "TIBC", "IRON", "RETICCTPCT" in the last 72 hours.  ECHOCARDIOGRAM COMPLETE  Result Date: 08/20/2023    ECHOCARDIOGRAM REPORT   Patient Name:   Bryan Singleton Date of Exam: 08/19/2023 Medical Rec #:  [id number]         Height:       74.0 in Accession #:    [id number]        Weight:       180.0 lb Date of Birth:  09/23/1932         BSA:          2.078 m Patient Age:    89 years          BP:           139/97 mmHg Patient Gender: M                 HR:           87 bpm. Exam Location:  [facility] Procedure: 2D Echo, Cardiac Doppler and Color Doppler Indications:     Syncope  History:         Patient has no prior history of Echocardiogram examinations.                  CHF, CAD, Prior CABG and Pacemaker, TIA,                  Signs/Symptoms:Syncope; Risk Factors:Diabetes.  Sonographer:     Mikki Harbor Referring Phys:  [id number] Andris Baumann Diagnosing Phys: Marcina Millard MD  Sonographer Comments: Technically difficult study due to poor echo windows and suboptimal apical window. Image acquisition challenging due to respiratory motion. IMPRESSIONS  1. Left ventricular ejection fraction, by estimation, is 50 to 55%. The left ventricle has low normal function. The left ventricle has no regional wall motion abnormalities. Indeterminate diastolic filling due to E-A fusion.  2. Right ventricular systolic function is normal. The right ventricular size is normal. There is mildly elevated pulmonary artery systolic pressure.  3. The mitral valve is normal in structure. Mild mitral valve regurgitation. No evidence of mitral stenosis.  4. Tricuspid valve regurgitation is mild to moderate.  5. The aortic valve is normal in structure.  Aortic valve regurgitation is not visualized. Mild aortic valve stenosis.  6. The inferior vena cava is normal in size with greater than 50% respiratory variability, suggesting right atrial pressure of 3 mmHg. FINDINGS  Left Ventricle: Left ventricular ejection fraction, by estimation, is 50 to 55%. The left ventricle has low normal function. The left ventricle has no regional wall motion abnormalities. The left ventricular internal cavity size was normal in size. There is no left ventricular hypertrophy. Indeterminate diastolic filling due to E-A fusion. Right Ventricle: The right ventricular size is normal. No increase in right ventricular wall thickness. Right ventricular systolic function is normal. There is mildly elevated pulmonary artery systolic pressure. The tricuspid regurgitant velocity is 2.50  m/s, and with an assumed right atrial pressure of 15 mmHg, the estimated right ventricular systolic pressure is 40.0 mmHg. Left Atrium: Left atrial size was normal in size. Right Atrium: Right atrial size was normal in size. Pericardium: There is no evidence of pericardial effusion. Mitral Valve: The mitral valve is normal in structure. Mild mitral valve regurgitation. No evidence of mitral valve stenosis. MV peak gradient, 2.5 mmHg. The mean mitral valve gradient is 1.0 mmHg. Tricuspid Valve: The tricuspid valve is normal in structure. Tricuspid valve regurgitation is mild to moderate. No evidence of tricuspid stenosis. Aortic Valve: The aortic valve is normal in structure. Aortic valve regurgitation is not visualized. Mild aortic stenosis is present. Aortic valve mean gradient measures 5.0 mmHg. Aortic valve peak gradient measures 10.6 mmHg. Aortic valve area, by VTI measures 1.05 cm. Pulmonic Valve: The pulmonic valve was normal in structure. Pulmonic valve regurgitation is not visualized. No evidence of pulmonic stenosis. Aorta: The aortic root is normal in size and structure. Venous: The inferior vena cava  is normal in size with greater than 50% respiratory variability, suggesting right atrial pressure of 3 mmHg. IAS/Shunts: No atrial level shunt detected by color flow Doppler. Additional Comments: A device lead is visualized.  LEFT VENTRICLE PLAX 2D LVIDd:         5.10 cm LVIDs:         3.80 cm LV PW:         1.20 cm LV IVS:        1.40 cm LVOT diam:     2.00 cm LV SV:         38  LV SV Index:   18 LVOT Area:     3.14 cm  RIGHT VENTRICLE RV Basal diam:  6.00 cm RV Mid diam:    4.70 cm LEFT ATRIUM           Index        RIGHT ATRIUM           Index LA diam:      4.40 cm 2.12 cm/m   RA Area:     37.30 cm LA Vol (A4C): 54.6 ml 26.27 ml/m  RA Volume:   173.00 ml 83.24 ml/m  AORTIC VALVE                     PULMONIC VALVE AV Area (Vmax):    1.24 cm      PV Vmax:       0.85 m/s AV Area (Vmean):   1.18 cm      PV Peak grad:  2.9 mmHg AV Area (VTI):     1.05 cm AV Vmax:           163.00 cm/s AV Vmean:          101.750 cm/s AV VTI:            0.361 m AV Peak Grad:      10.6 mmHg AV Mean Grad:      5.0 mmHg LVOT Vmax:         64.30 cm/s LVOT Vmean:        38.350 cm/s LVOT VTI:          0.120 m LVOT/AV VTI ratio: 0.33  AORTA Ao Root diam: 3.80 cm MITRAL VALVE               TRICUSPID VALVE MV Area (PHT): 3.20 cm    TR Peak grad:   25.0 mmHg MV Area VTI:   1.59 cm    TR Vmax:        250.00 cm/s MV Peak grad:  2.5 mmHg MV Mean grad:  1.0 mmHg    SHUNTS MV Vmax:       0.80 m/s    Systemic VTI:  0.12 m MV Vmean:      40.2 cm/s   Systemic Diam: 2.00 cm MV Decel Time: 237 msec MV E velocity: 78.60 cm/s Marcina Millard MD Electronically signed by Marcina Millard MD Signature Date/Time: 08/20/2023/3:54:14 PM    Final    CT Angio Chest Pulmonary Embolism (PE) W or WO Contrast  Result Date: 08/20/2023 CLINICAL DATA:  Hypoxia. EXAM: CT ANGIOGRAPHY CHEST WITH CONTRAST TECHNIQUE: Multidetector CT imaging of the chest was performed using the standard protocol during bolus administration of intravenous contrast.  Multiplanar CT image reconstructions and MIPs were obtained to evaluate the vascular anatomy. RADIATION DOSE REDUCTION: This exam was performed according to the departmental dose-optimization program which includes automated exposure control, adjustment of the mA and/or kV according to patient size and/or use of iterative reconstruction technique. CONTRAST:  75mL OMNIPAQUE IOHEXOL 350 MG/ML SOLN COMPARISON:  August 18, 2023. FINDINGS: Cardiovascular: Satisfactory opacification of the pulmonary arteries to the segmental level. No evidence of pulmonary embolism. Right atrial enlargement is noted. Status post coronary artery bypass graft. Left-sided pacemaker is noted. No pericardial effusion. Mediastinum/Nodes: No enlarged mediastinal, hilar, or axillary lymph nodes. Thyroid gland, trachea, and esophagus demonstrate no significant findings. Lungs/Pleura: No pneumothorax or pleural effusion is noted. Minimal bibasilar subsegmental atelectasis or scarring is noted. Upper Abdomen: No acute abnormality. Musculoskeletal: No chest wall  abnormality. No acute or significant osseous findings. Review of the MIP images confirms the above findings. IMPRESSION: No definite evidence of pulmonary embolus. Right atrial enlargement is noted as well as enlargement of IVC and hepatic veins. Status post coronary artery bypass graft. Minimal bibasilar subsegmental atelectasis or scarring. Aortic Atherosclerosis (ICD10-I70.0). Electronically Signed   By: Lupita Raider M.D.   On: 08/20/2023 11:59     Echo LVEF 50-55%, mild mitral regurgitation, mild aortic stenosis  TELEMETRY: Ventricular pacing 84 bpm:  ASSESSMENT AND PLAN:  Principal Problem:   Syncope and collapse Active Problems:   AKI (acute kidney injury) (HCC)   Generalized weakness   Long term (current) use of warfarin   History of TIAs   SSS (sick sinus syndrome) (HCC)   Severe tricuspid valve insufficiency   Peripheral vascular disease (HCC)   Moderate  mitral insufficiency   Controlled type 2 diabetes mellitus with complication, without long-term current use of insulin (HCC)   Chronic systolic CHF (congestive heart failure), NYHA class 2 (HCC)   Presence of permanent cardiac pacemaker   Hx of CABG x 5   Coronary artery disease s/p CABG x 5   Subtherapeutic international normalized ratio (INR)    1.  Syncope, loss consciousness for only a few seconds, CT reveals ventricular paced rhythm with PVCs, pacemaker interrogation unremarkable 2.  Chronic atrial fibrillation, on warfarin for stroke prevention 3.  Sinus syndrome, status post pacemaker 4.  Coronary artery disease, status post CABG x 5, denies chest pain, troponin normal sees 18, 18) 5.  Generalized weakness, improved  Recommendations  1.  Continue current therapy 2.  Continue Eliquis for stroke prevention 3.  Defer further cardiac workup at this time 4.  Consider discharge home later today or in a.m. 5.  Follow-up Dr. Juliann Pares 1 to 2 weeks  Sign off for now, please call with any questions     Marcina Millard, MD, PhD, [facility] 08/21/2023 9:56 AM

## 2023-08-21 NOTE — TOC Transition Note (Addendum)
Transition of Care [facility]) - CM/SW Discharge Note   Patient Details  Name: Bryan Singleton MRN: [id number] Date of Birth: [birth year]  Transition of Care [facility]) CM/SW Contact:  Bing Quarry, RN Phone Number: 08/21/2023, 1:27 PM   Clinical Narrative: 11/23: DC orders and HH orders in for today. Spoke with spouse regarding discharge plan and HH choice. She decided to go [place]. Frances Furbish accepted for [facility] PT/OT. No DME orders noted.   To Do List per DC After Visit Summary:  PCP Dr Daniel Nones. Follow up in 1 week.  [phone]. [facility])  Cardiologist: Dorothyann Peng in 1 week. [phone]. [facility] Schedule appointment with Cristopher Peru, Neurology as soon as possible in for 2 week visit post discharge. [phone] at [facility]  Please contact Inpatient TOC for further needs prior to discharge.   Gabriel Cirri MSN RN CM  Care Management Department.  [place]  [facility] Direct Dial: [phone] Main Office Phone: [phone] Weekends Only       Final next level of care: Home w Home Health Services Barriers to Discharge: Barriers Resolved   Patient Goals and CMS Choice   Choice offered to / list presented to : Spouse  Discharge Placement                         Discharge Plan and Services Additional resources added to the After Visit Summary for                  DME Arranged: N/A DME Agency: NA       HH Arranged: PT, OT HH Agency: [facility] Date [facility] Agency Contacted: 08/21/23 Time HH Agency Contacted: 1326 Representative spoke with at [facility] Agency: Kandee Keen  Social Determinants of Health (SDOH) Interventions SDOH Screenings   Food Insecurity: No Food Insecurity (08/19/2023)  Housing: Low Risk  (08/19/2023)  Transportation Needs: No Transportation Needs (08/19/2023)  Utilities: Not At Risk (08/19/2023)  Financial Resource Strain: Low Risk  (08/16/2023)   Received from [facility]  Tobacco  Use: Medium Risk (01/12/2023)   Received from [facility]     Readmission Risk Interventions     No data to display

## 2024-06-29 ENCOUNTER — Other Ambulatory Visit: Payer: Self-pay | Admitting: Internal Medicine

## 2024-06-29 DIAGNOSIS — I10 Essential (primary) hypertension: Secondary | ICD-10-CM

## 2024-06-29 DIAGNOSIS — R413 Other amnesia: Secondary | ICD-10-CM

## 2024-07-05 ENCOUNTER — Ambulatory Visit
Admission: RE | Admit: 2024-07-05 | Discharge: 2024-07-05 | Disposition: A | Discharge status: Home / Self Care | Source: Ambulatory Visit | Attending: Internal Medicine | Admitting: Internal Medicine

## 2024-07-05 DIAGNOSIS — I10 Essential (primary) hypertension: Secondary | ICD-10-CM | POA: Insufficient documentation

## 2024-07-05 DIAGNOSIS — R413 Other amnesia: Secondary | ICD-10-CM | POA: Insufficient documentation

## 2024-07-05 DIAGNOSIS — D62 Acute posthemorrhagic anemia: Secondary | ICD-10-CM | POA: Diagnosis not present

## 2024-07-06 ENCOUNTER — Encounter: Payer: Self-pay | Admitting: Emergency Medicine

## 2024-07-06 ENCOUNTER — Inpatient Hospital Stay
Admission: EM | Admit: 2024-07-06 | Discharge: 2024-07-11 | DRG: 811 | Disposition: A | Discharge status: Home / Self Care | Attending: Obstetrics and Gynecology | Admitting: Obstetrics and Gynecology

## 2024-07-06 ENCOUNTER — Other Ambulatory Visit: Payer: Self-pay

## 2024-07-06 ENCOUNTER — Emergency Department

## 2024-07-06 DIAGNOSIS — K5521 Angiodysplasia of colon with hemorrhage: Secondary | ICD-10-CM | POA: Diagnosis present

## 2024-07-06 DIAGNOSIS — Z66 Do not resuscitate: Secondary | ICD-10-CM | POA: Diagnosis present

## 2024-07-06 DIAGNOSIS — Z7901 Long term (current) use of anticoagulants: Secondary | ICD-10-CM

## 2024-07-06 DIAGNOSIS — D649 Anemia, unspecified: Secondary | ICD-10-CM | POA: Diagnosis not present

## 2024-07-06 DIAGNOSIS — Z87891 Personal history of nicotine dependence: Secondary | ICD-10-CM | POA: Diagnosis not present

## 2024-07-06 DIAGNOSIS — M109 Gout, unspecified: Secondary | ICD-10-CM | POA: Diagnosis present

## 2024-07-06 DIAGNOSIS — Z79890 Hormone replacement therapy (postmenopausal): Secondary | ICD-10-CM | POA: Diagnosis not present

## 2024-07-06 DIAGNOSIS — K5520 Angiodysplasia of colon without hemorrhage: Secondary | ICD-10-CM | POA: Diagnosis not present

## 2024-07-06 DIAGNOSIS — I5030 Unspecified diastolic (congestive) heart failure: Secondary | ICD-10-CM | POA: Diagnosis present

## 2024-07-06 DIAGNOSIS — K5730 Diverticulosis of large intestine without perforation or abscess without bleeding: Secondary | ICD-10-CM | POA: Diagnosis present

## 2024-07-06 DIAGNOSIS — N1832 Chronic kidney disease, stage 3b: Secondary | ICD-10-CM | POA: Diagnosis present

## 2024-07-06 DIAGNOSIS — Z8673 Personal history of transient ischemic attack (TIA), and cerebral infarction without residual deficits: Secondary | ICD-10-CM

## 2024-07-06 DIAGNOSIS — N179 Acute kidney failure, unspecified: Secondary | ICD-10-CM | POA: Diagnosis present

## 2024-07-06 DIAGNOSIS — Z951 Presence of aortocoronary bypass graft: Secondary | ICD-10-CM

## 2024-07-06 DIAGNOSIS — K635 Polyp of colon: Secondary | ICD-10-CM | POA: Diagnosis present

## 2024-07-06 DIAGNOSIS — J449 Chronic obstructive pulmonary disease, unspecified: Secondary | ICD-10-CM | POA: Diagnosis present

## 2024-07-06 DIAGNOSIS — E1151 Type 2 diabetes mellitus with diabetic peripheral angiopathy without gangrene: Secondary | ICD-10-CM | POA: Diagnosis present

## 2024-07-06 DIAGNOSIS — E785 Hyperlipidemia, unspecified: Secondary | ICD-10-CM | POA: Diagnosis present

## 2024-07-06 DIAGNOSIS — E1122 Type 2 diabetes mellitus with diabetic chronic kidney disease: Secondary | ICD-10-CM | POA: Diagnosis present

## 2024-07-06 DIAGNOSIS — K640 First degree hemorrhoids: Secondary | ICD-10-CM | POA: Diagnosis present

## 2024-07-06 DIAGNOSIS — Z91013 Allergy to seafood: Secondary | ICD-10-CM | POA: Diagnosis not present

## 2024-07-06 DIAGNOSIS — I480 Paroxysmal atrial fibrillation: Secondary | ICD-10-CM | POA: Diagnosis present

## 2024-07-06 DIAGNOSIS — I739 Peripheral vascular disease, unspecified: Secondary | ICD-10-CM | POA: Diagnosis present

## 2024-07-06 DIAGNOSIS — I071 Rheumatic tricuspid insufficiency: Secondary | ICD-10-CM | POA: Diagnosis present

## 2024-07-06 DIAGNOSIS — D62 Acute posthemorrhagic anemia: Principal | ICD-10-CM | POA: Diagnosis present

## 2024-07-06 DIAGNOSIS — Z880 Allergy status to penicillin: Secondary | ICD-10-CM

## 2024-07-06 DIAGNOSIS — R079 Chest pain, unspecified: Secondary | ICD-10-CM | POA: Diagnosis present

## 2024-07-06 DIAGNOSIS — I495 Sick sinus syndrome: Secondary | ICD-10-CM | POA: Diagnosis present

## 2024-07-06 DIAGNOSIS — K449 Diaphragmatic hernia without obstruction or gangrene: Secondary | ICD-10-CM | POA: Diagnosis not present

## 2024-07-06 DIAGNOSIS — I130 Hypertensive heart and chronic kidney disease with heart failure and stage 1 through stage 4 chronic kidney disease, or unspecified chronic kidney disease: Secondary | ICD-10-CM | POA: Diagnosis present

## 2024-07-06 DIAGNOSIS — Z950 Presence of cardiac pacemaker: Secondary | ICD-10-CM

## 2024-07-06 DIAGNOSIS — I2510 Atherosclerotic heart disease of native coronary artery without angina pectoris: Secondary | ICD-10-CM | POA: Diagnosis present

## 2024-07-06 DIAGNOSIS — E118 Type 2 diabetes mellitus with unspecified complications: Secondary | ICD-10-CM | POA: Diagnosis present

## 2024-07-06 DIAGNOSIS — Z79899 Other long term (current) drug therapy: Secondary | ICD-10-CM

## 2024-07-06 DIAGNOSIS — D122 Benign neoplasm of ascending colon: Secondary | ICD-10-CM | POA: Diagnosis not present

## 2024-07-06 DIAGNOSIS — D509 Iron deficiency anemia, unspecified: Secondary | ICD-10-CM | POA: Diagnosis not present

## 2024-07-06 DIAGNOSIS — E039 Hypothyroidism, unspecified: Secondary | ICD-10-CM | POA: Diagnosis present

## 2024-07-06 DIAGNOSIS — Z888 Allergy status to other drugs, medicaments and biological substances status: Secondary | ICD-10-CM

## 2024-07-06 DIAGNOSIS — I10 Essential (primary) hypertension: Secondary | ICD-10-CM | POA: Diagnosis present

## 2024-07-06 DIAGNOSIS — K921 Melena: Secondary | ICD-10-CM | POA: Diagnosis not present

## 2024-07-06 LAB — RETIC PANEL
Immature Retic Fract: 36 % — ABNORMAL HIGH (ref 2.3–15.9)
RBC.: 2.51 MIL/uL — ABNORMAL LOW (ref 4.22–5.81)
Retic Count, Absolute: 97.6 K/uL (ref 19.0–186.0)
Retic Ct Pct: 3.9 % — ABNORMAL HIGH (ref 0.4–3.1)
Reticulocyte Hemoglobin: 23.1 pg — ABNORMAL LOW (ref >27.9)

## 2024-07-06 LAB — BASIC METABOLIC PANEL WITH GFR
Anion gap: 11 (ref 5–15)
BUN: 53 mg/dL — ABNORMAL HIGH (ref 8–23)
CO2: 24 mmol/L (ref 22–32)
Calcium: 8.8 mg/dL — ABNORMAL LOW (ref 8.9–10.3)
Chloride: 102 mmol/L (ref 98–111)
Creatinine, Ser: 1.49 mg/dL — ABNORMAL HIGH (ref 0.61–1.24)
GFR, Estimated: 44 mL/min — ABNORMAL LOW (ref >60)
Glucose, Bld: 132 mg/dL — ABNORMAL HIGH (ref 70–99)
Potassium: 4 mmol/L (ref 3.5–5.1)
Sodium: 137 mmol/L (ref 135–145)

## 2024-07-06 LAB — HEMOGLOBIN AND HEMATOCRIT, BLOOD
HCT: 22.7 % — ABNORMAL LOW (ref 39.0–52.0)
Hemoglobin: 7 g/dL — ABNORMAL LOW (ref 13.0–17.0)

## 2024-07-06 LAB — CBC
HCT: 21.8 % — ABNORMAL LOW (ref 39.0–52.0)
Hemoglobin: 6.5 g/dL — ABNORMAL LOW (ref 13.0–17.0)
MCH: 27.2 pg (ref 26.0–34.0)
MCHC: 29.8 g/dL — ABNORMAL LOW (ref 30.0–36.0)
MCV: 91.2 fL (ref 80.0–100.0)
Platelets: 254 K/uL (ref 150–400)
RBC: 2.39 MIL/uL — ABNORMAL LOW (ref 4.22–5.81)
RDW: 17.4 % — ABNORMAL HIGH (ref 11.5–15.5)
WBC: 7.5 K/uL (ref 4.0–10.5)
nRBC: 0.3 % — ABNORMAL HIGH (ref 0.0–0.2)

## 2024-07-06 LAB — FERRITIN: Ferritin: 11 ng/mL — ABNORMAL LOW (ref 24–336)

## 2024-07-06 LAB — TROPONIN I (HIGH SENSITIVITY)
Troponin I (High Sensitivity): 33 ng/L — ABNORMAL HIGH (ref <18)
Troponin I (High Sensitivity): 31 ng/L — ABNORMAL HIGH (ref <18)

## 2024-07-06 LAB — IRON AND TIBC
Iron: 21 ug/dL — ABNORMAL LOW (ref 45–182)
Saturation Ratios: 5 % — ABNORMAL LOW (ref 17.9–39.5)
TIBC: 455 ug/dL — ABNORMAL HIGH (ref 250–450)
UIBC: 434 ug/dL

## 2024-07-06 LAB — ABO/RH: ABO/RH(D): AB POS

## 2024-07-06 LAB — PREPARE RBC (CROSSMATCH)

## 2024-07-06 LAB — PROTIME-INR
INR: 2.4 — ABNORMAL HIGH (ref 0.8–1.2)
Prothrombin Time: 27.7 s — ABNORMAL HIGH (ref 11.4–15.2)

## 2024-07-06 MED ORDER — ACETAMINOPHEN 650 MG RE SUPP: 650.0000 mg | Freq: Four times a day (QID) | RECTAL | Status: DC | PRN

## 2024-07-06 MED ORDER — ALBUTEROL SULFATE (2.5 MG/3ML) 0.083% IN NEBU: 2.5000 mg | INHALATION_SOLUTION | RESPIRATORY_TRACT | Status: DC | PRN

## 2024-07-06 MED ORDER — ACETAMINOPHEN 325 MG PO TABS: 650.0000 mg | ORAL_TABLET | Freq: Four times a day (QID) | ORAL | Status: DC | PRN

## 2024-07-06 MED ORDER — SODIUM CHLORIDE 0.9 % IV SOLN
10.0000 mL/h | Freq: Once | INTRAVENOUS | Status: AC
  Administered 2024-07-06: 10 mL/h via INTRAVENOUS

## 2024-07-06 MED ORDER — PANTOPRAZOLE SODIUM 40 MG IV SOLR
40.0000 mg | Freq: Once | INTRAVENOUS | Status: AC
  Administered 2024-07-06: 40 mg via INTRAVENOUS
  Filled 2024-07-06: qty 10

## 2024-07-06 MED ORDER — PANTOPRAZOLE SODIUM 40 MG IV SOLR
40.0000 mg | INTRAVENOUS | Status: DC
  Administered 2024-07-06: 40 mg via INTRAVENOUS
  Filled 2024-07-06: qty 10

## 2024-07-06 NOTE — H&P (Signed)
 History and Physical    Bryan Singleton:[id number] DOB: [birth year] DOA: 07/06/2024  DOS: the patient was seen and examined on 07/06/2024  PCP: Fernande Ophelia [name], MD   Patient coming from: Home  I have personally briefly reviewed patient's old medical records in [facility] and CareEverywhere  HPI:   Bryan Singleton is a 89 y.o. year old male with past medical history of hypertension, hyperlipidemia complicated by CAD status post CABG, sick sinus syndrome status post pacemaker, history of TIA presenting to the ED with exertional chest pain, and few weeks of exertional dyspnea on exertion.  Patient states he has been having worsening shortness of breath over the last few weeks and fatigue as well.  He developed exertional chest pain that resolved without intervention.  Patient states he has not had a colonoscopy or endoscopy previously and chart review shows no record of either.  Patient defers most of his medical questions and his medications to his wife.  He states he followed with his PCP a few days ago and was scheduled to get some blood work today but had this episode.  ED Course: On arrival to ED pati BMP showing AKI along with elevated BUN ent was hypertensive and CBC showed patient's hemoglobin at 6.5.  EDP ordered 1 unit of blood.  BMP showing AKI along with elevated BUN.  Troponin ordered and minimally elevated but flat.  Chest x-ray was performed that showed mild cardiomegaly without any acute process.  Patient was given 1 dose of IV PPI and TRH was consulted for admission.  TRH contacted for admission.  Review of Systems: As mentioned in the history of present illness. All other systems reviewed and are negative.   Past Medical History:  Diagnosis Date   COPD (chronic obstructive pulmonary disease) (HCC)    Coronary artery disease    Hypertension    Presence of permanent cardiac pacemaker     Past Surgical History:  Procedure Laterality Date   PACEMAKER  INSERTION N/A 06/04/2020   Procedure: GENERATOR Change out;  Surgeon: Ammon Blunt, MD;  Location: [facility] ORS;  Service: Cardiovascular;  Laterality: N/A;     Allergies  Allergen Reactions   Allegra Allergy [Fexofenadine Hcl]    Penicillins    Prednisone    Shellfish Allergy     History reviewed. No pertinent family history.  Prior to Admission medications   Medication Sig Start Date End Date Taking? Authorizing Provider  albuterol  (VENTOLIN  HFA) 108 (90 Base) MCG/ACT inhaler Inhale 2 puffs into the lungs every 6 (six) hours as needed. 04/10/24  Yes [provider]  apixaban  (ELIQUIS ) 5 MG TABS tablet Take 1 tablet (5 mg total) by mouth 2 (two) times daily. 08/21/23 07/06/24 Yes Maree Hue, MD  Apoaequorin (PREVAGEN EXTRA STRENGTH) 20 MG CAPS Take 20 mg by mouth daily.   Yes [provider]  atorvastatin  (LIPITOR) 10 MG tablet Take 10 mg by mouth daily.   Yes [provider]  furosemide (LASIX) 80 MG tablet Take 80 mg by mouth daily. 03/14/24 03/14/25 Yes [provider]  levothyroxine  (SYNTHROID ) 25 MCG tablet Take 25 mcg by mouth daily before breakfast.   Yes [provider]  lisinopril  (ZESTRIL ) 5 MG tablet Take 5 mg by mouth daily.   Yes [provider]  metoprolol  succinate (TOPROL -XL) 25 MG 24 hr tablet Take 25 mg by mouth daily.   Yes [provider]  triamcinolone cream (KENALOG) 0.1 % Apply 1 Application topically 2 (two) times  daily as needed (itch). 06/28/24 06/28/25 Yes [provider]      reports that he has quit smoking. His smoking use included cigarettes. He has never used smokeless tobacco. No history on file for alcohol use and drug use. Lives with wife Tobacco-denies current use previous tobacco use over 30 years ago. EtOH- Denies use.  Illicit drug use- denies use.  IADLs/ADLs- can person independently at baseline    Physical Exam: Vitals:   07/06/24 1400 07/06/24 1430 07/06/24 1606  07/06/24 1609  BP: (!) 93/52 136/63 119/71   Pulse: (!) 55 62  79  Resp: (!) 21 11  15   Temp:   98 F (36.7 C)   TempSrc:    Oral  SpO2: 100% 100%  100%  Weight:      Height:         Physical Exam General: NAD HENT: NCAT Lungs: CTAB, no wheeze, rhonchi or rales.  Cardiovascular: Normal heart sounds, no r/m/g, diminished pedal pulses. No LE edema Abdomen: No TTP, normal bowel sounds Rectal: no hemorrhoid noted on external inspection or felt on internal inspection. No masses appreciated.  MSK: No asymmetry or muscle atrophy.  Neuro: Alert and oriented x4. CN grossly intact Psych: Normal mood and normal affect    Labs on Admission: I have personally reviewed following labs and imaging studies  CBC: Recent Labs  Lab 07/06/24 1056  WBC 7.5  HGB 6.5*  HCT 21.8*  MCV 91.2  PLT 254   Basic Metabolic Panel: Recent Labs  Lab 07/06/24 1056  NA 137  K 4.0  CL 102  CO2 24  GLUCOSE 132*  BUN 53*  CREATININE 1.49*  CALCIUM  8.8*   GFR: Estimated Creatinine Clearance: 36.8 mL/min (A) (by C-G formula based on SCr of 1.49 mg/dL (H)). Liver Function Tests: No results for input(s): AST, ALT, ALKPHOS, BILITOT, PROT, ALBUMIN in the last 168 hours. No results for input(s): LIPASE, AMYLASE in the last 168 hours. No results for input(s): AMMONIA in the last 168 hours. Coagulation Profile: Recent Labs  Lab 07/06/24 1056  INR 2.4*   Cardiac Enzymes: Recent Labs  Lab 07/06/24 1056 07/06/24 1258  TROPONINIHS 33* 31*   BNP (last 3 results) No results for input(s): BNP in the last 8760 hours. HbA1C: No results for input(s): HGBA1C in the last 72 hours. CBG: No results for input(s): GLUCAP in the last 168 hours. Lipid Profile: No results for input(s): CHOL, HDL, LDLCALC, TRIG, CHOLHDL, LDLDIRECT in the last 72 hours. Thyroid  Function Tests: No results for input(s): TSH, T4TOTAL, FREET4, T3FREE, THYROIDAB in the last 72  hours. Anemia Panel: Recent Labs    07/06/24 1258 07/06/24 1836  FERRITIN 11*  --   TIBC 455*  --   IRON 21*  --   RETICCTPCT  --  3.9*   Urine analysis: No results found for: COLORURINE, APPEARANCEUR, LABSPEC, PHURINE, GLUCOSEU, HGBUR, BILIRUBINUR, KETONESUR, PROTEINUR, UROBILINOGEN, NITRITE, LEUKOCYTESUR  Radiological Exams on Admission: I have personally reviewed images DG Chest 2 View Result Date: 07/06/2024 CLINICAL DATA:  CP EXAM: PORTABLE CHEST 1 VIEW COMPARISON:  CT chest, 08/19/2024. FINDINGS: Support lines; LEFT chest, subclavian-approach pacer with intact single lead. Borderline cardiac enlargement. Aortic arch atherosclerosis. Coronary bypass. Lungs are well inflated. No focal consolidation or mass. No pleural effusion or pneumothorax. Median sternotomy change. No acute displaced fracture. IMPRESSION: 1. Mild cardiomegaly without acute superimposed cardiopulmonary process 2.  Aortic Atherosclerosis (ICD10-I70.0). Electronically Signed   By: Thom Hall M.D.   On: 07/06/2024 11:56  EKG: My personal interpretation of EKG shows: normal rate, with ventricular paced rhythm. No acute findings.   Assessment/Plan Principal Problem:   Symptomatic anemia Active Problems:   AKI (acute kidney injury)   SSS (sick sinus syndrome) (HCC)   Controlled type 2 diabetes mellitus with complication, without long-term current use of insulin  (HCC)   Coronary artery disease s/p CABG x 5   Paroxysmal atrial fibrillation (HCC)   Symptomatic anemia 2/2 GI Bleed Pt with declining hgb but HDS suspect GIB. Pt without hx of GIB. Endorses NSAID use. No hx of endoscopy or colonoscopy. Hgb steadily decreasing and was 7.9 on last OP visit. Suspect subacute process.  -GI consult, appreciate their assistance -Maintain adequate acess with 2 large bore PIV -IV PPI 40 mg BID -Keep NPO after midnight -H/H q6hrs, transfuse if  hgb <7 -Follow up on ordered iron studies (iron panel,  ferritin, folate, VB12)  AKI on CKD: Creatinine 1.49.  Creatinine was 1.2 on recent blood work.  Appears his baseline is 1.1-1.2 following a category of CKD 3a.  Suspect this is secondary to GI bleed and will improve with better perfusion.  Chronic Problems: Hypertension/HFpEF: Continue home beta-blocker. Sick sinus syndrome/paroxysmal A-fib: Status post pacemaker.  On beta-blocker.  Continue beta-blocker. Hyperlipidemia: Continue present regimen Type 2 diabetes: Currently diet controlled.  Daily glucose check with BMP.. Paroxysmal Atrial Fibrillation: Continue beta blocker, hold AC given bleeding.  Hypothyroidism: On low dose levothyroxine .  Continue home levothyroxine   VTE prophylaxis:  SCDs Diet: NPO Code Status:  DNR/DNI(Do NOT Intubate) Telemetry:  Admission status: Inpatient, Progressive Patient is from: Home  Anticipated d/c is to: Home  Anticipated d/c is in: 2-3 days    Family Communication: Updated at bedside   Consults called: GI    Severity of Illness: The appropriate patient status for this patient is INPATIENT. Inpatient status is judged to be reasonable and necessary in order to provide the required intensity of service to ensure the patient's safety. The patient's presenting symptoms, physical exam findings, and initial radiographic and laboratory data in the context of their chronic comorbidities is felt to place them at high risk for further clinical deterioration. Furthermore, it is not anticipated that the patient will be medically stable for discharge from the hospital within 2 midnights of admission.   * I certify that at the point of admission it is my clinical judgment that the patient will require inpatient hospital care spanning beyond 2 midnights from the point of admission due to high intensity of service, high risk for further deterioration and high frequency of surveillance required.DEWAINE Morene Bathe, MD Jolynn DEL. [facility]

## 2024-07-06 NOTE — ED Provider Notes (Addendum)
 [facility] Provider Note    Event Date/Time   First MD Initiated Contact with Patient 07/06/24 1101     (approximate)   History   Chief Complaint: Chest Pain   HPI  Bryan Singleton is a 89 y.o. male with a history of COPD hypertension CAD pacemaker who comes ED due to episode of chest pain that started this morning at around 10:00 AM.  Felt like pressure in the center of the chest.  Nonradiating, no diaphoresis shortness of breath or vomiting.  Lasted 45 minutes and then resolved.  He feels back to normal now currently.  Denies any recent exertional chest pain.  He has been having dyspnea on exertion which has been worsening over the past few weeks.  Denies black or bloody stool.  No fever        Past Medical History:  Diagnosis Date   COPD (chronic obstructive pulmonary disease) (HCC)    Coronary artery disease    Hypertension    Presence of permanent cardiac pacemaker     Current Outpatient Rx   Order #: [id number] Class: Historical Med   Order #: [id number] Class: Normal   Order #: [id number] Class: Historical Med   Order #: [id number] Class: Historical Med   Order #: [id number] Class: Historical Med   Order #: [id number] Class: Historical Med   Order #: [id number] Class: Historical Med   Order #: [id number] Class: Historical Med   Order #: [id number] Class: Historical Med    Past Surgical History:  Procedure Laterality Date   PACEMAKER INSERTION N/A 06/04/2020   Procedure: GENERATOR Change out;  Surgeon: Ammon Blunt, MD;  Location: [facility] ORS;  Service: Cardiovascular;  Laterality: N/A;    Physical Exam   Triage Vital Signs: ED Triage Vitals  Encounter Vitals Group     BP 07/06/24 1051 (!) 94/43     Girls Systolic BP Percentile --      Girls Diastolic BP Percentile --      Boys Systolic BP Percentile --      Boys Diastolic BP Percentile --      Pulse Rate 07/06/24 1051 70     Resp 07/06/24 1051 16     Temp 07/06/24 1051 (!) 96.5 F  (35.8 C)     Temp Source 07/06/24 1051 Axillary     SpO2 07/06/24 1051 97 %     Weight 07/06/24 1050 174 lb 2.6 oz (79 kg)     Height 07/06/24 1050 6' 2 (1.88 m)     Head Circumference --      Peak Flow --      Pain Score 07/06/24 1050 5     Pain Loc --      Pain Education --      Exclude from Growth Chart --     Most recent vital signs: Vitals:   07/06/24 1400 07/06/24 1430  BP: (!) 93/52 136/63  Pulse: (!) 55 62  Resp: (!) 21 11  Temp:    SpO2: 100% 100%    General: Awake, no distress.  CV:  Good peripheral perfusion.  Regular rate rhythm Resp:  Normal effort.  Clear lungs Abd:  No distention.  Soft nontender.  Rectal exam reveals melanotic stool, strongly Hemoccult positive Other:  Moist oral mucosa   ED Results / Procedures / Treatments   Labs (all labs ordered are listed, but only abnormal results are displayed) Labs Reviewed  BASIC METABOLIC PANEL WITH GFR - Abnormal; Notable for the following components:      Result Value  Glucose, Bld 132 (*)    BUN 53 (*)    Creatinine, Ser 1.49 (*)    Calcium  8.8 (*)    GFR, Estimated 44 (*)    All other components within normal limits  CBC - Abnormal; Notable for the following components:   RBC 2.39 (*)    Hemoglobin 6.5 (*)    HCT 21.8 (*)    MCHC 29.8 (*)    RDW 17.4 (*)    nRBC 0.3 (*)    All other components within normal limits  PROTIME-INR - Abnormal; Notable for the following components:   Prothrombin Time 27.7 (*)    INR 2.4 (*)    All other components within normal limits  TROPONIN I (HIGH SENSITIVITY) - Abnormal; Notable for the following components:   Troponin I (High Sensitivity) 33 (*)    All other components within normal limits  TROPONIN I (HIGH SENSITIVITY) - Abnormal; Notable for the following components:   Troponin I (High Sensitivity) 31 (*)    All other components within normal limits  TYPE AND SCREEN  PREPARE RBC (CROSSMATCH)  ABO/RH     EKG Interpreted by me Ventricular paced  rhythm, rate of 73.  Left axis, left bundle branch block, no acute ischemic changes.   RADIOLOGY Chest x-ray interpreted by me, unremarkable.  Radiology report reviewed   PROCEDURES:  .Critical Care  Performed by: Viviann Pastor, MD Authorized by: Viviann Pastor, MD   Critical care provider statement:    Critical care time (minutes):  35   Critical care time was exclusive of:  Separately billable procedures and treating other patients   Critical care was necessary to treat or prevent imminent or life-threatening deterioration of the following conditions:  Circulatory failure and cardiac failure   Critical care was time spent personally by me on the following activities:  Development of treatment plan with patient or surrogate, discussions with consultants, evaluation of patient's response to treatment, examination of patient, obtaining history from patient or surrogate, ordering and performing treatments and interventions, ordering and review of laboratory studies, ordering and review of radiographic studies, pulse oximetry, re-evaluation of patient's condition and review of old charts   Care discussed with: admitting provider      MEDICATIONS ORDERED IN ED: Medications  0.9 %  sodium chloride  infusion (10 mL/hr Intravenous New Bag/Given 07/06/24 1320)  pantoprazole (PROTONIX) injection 40 mg (40 mg Intravenous Given 07/06/24 1251)     IMPRESSION / MDM / ASSESSMENT AND PLAN / ED COURSE  I reviewed the triage vital signs and the nursing notes.  DDx: NSTEMI, pneumonia, pleural effusion, pulmonary edema, electrolyte derangement, anemia, AKI  Patient's presentation is most consistent with acute presentation with potential threat to life or bodily function.  Patient presents with dyspnea on exertion and an episode of chest pain this morning.  Currently symptoms resolved.  Vital signs unremarkable, labs show hemoglobin of 6.5 which has been downtrending from a baseline of close to  12 on April 27, 2024.  Symptoms are due to occult GI bleeding in the setting of Eliquis  use causing anemia.  Will transfuse 1 unit and plan to admit for further management.   Clinical Course as of 07/06/24 1522  Thu Jul 06, 2024  1522 Case discussed with hospitalist [PS]    Clinical Course User Index [PS] Viviann Pastor, MD     FINAL CLINICAL IMPRESSION(S) / ED DIAGNOSES   Final diagnoses:  Acute blood loss anemia  Symptomatic anemia     Rx / DC  Orders   ED Discharge Orders     None        Note:  This document was prepared using Dragon voice recognition software and may include unintentional dictation errors.   Viviann Pastor, MD 07/06/24 1246    Viviann Pastor, MD 07/06/24 [phone]

## 2024-07-06 NOTE — ED Notes (Signed)
 Fall risk band applied but no other fall precautions done at this time. Patient is able to ambulate well under his own power and still has his shoes on. Will monitor for changes in status.

## 2024-07-06 NOTE — ED Triage Notes (Signed)
 Pt endorses CP that started this morning. Denies back pain or SOB. States he is feeling a little weaker than normal. Hx of pacemaker.

## 2024-07-07 DIAGNOSIS — M109 Gout, unspecified: Secondary | ICD-10-CM | POA: Diagnosis present

## 2024-07-07 DIAGNOSIS — D649 Anemia, unspecified: Secondary | ICD-10-CM | POA: Diagnosis not present

## 2024-07-07 LAB — BASIC METABOLIC PANEL WITH GFR
Anion gap: 10 (ref 5–15)
BUN: 54 mg/dL — ABNORMAL HIGH (ref 8–23)
CO2: 25 mmol/L (ref 22–32)
Calcium: 8.6 mg/dL — ABNORMAL LOW (ref 8.9–10.3)
Chloride: 104 mmol/L (ref 98–111)
Creatinine, Ser: 1.45 mg/dL — ABNORMAL HIGH (ref 0.61–1.24)
GFR, Estimated: 46 mL/min — ABNORMAL LOW (ref >60)
Glucose, Bld: 104 mg/dL — ABNORMAL HIGH (ref 70–99)
Potassium: 4.1 mmol/L (ref 3.5–5.1)
Sodium: 139 mmol/L (ref 135–145)

## 2024-07-07 LAB — HEPATIC FUNCTION PANEL
ALT: 13 U/L (ref 0–44)
AST: 30 U/L (ref 15–41)
Albumin: 3 g/dL — ABNORMAL LOW (ref 3.5–5.0)
Alkaline Phosphatase: 45 U/L (ref 38–126)
Bilirubin, Direct: 0.5 mg/dL — ABNORMAL HIGH (ref 0.0–0.2)
Indirect Bilirubin: 1.2 mg/dL — ABNORMAL HIGH (ref 0.3–0.9)
Total Bilirubin: 1.7 mg/dL — ABNORMAL HIGH (ref 0.0–1.2)
Total Protein: 6.4 g/dL — ABNORMAL LOW (ref 6.5–8.1)

## 2024-07-07 LAB — CBC
HCT: 20.5 % — ABNORMAL LOW (ref 39.0–52.0)
Hemoglobin: 6.4 g/dL — ABNORMAL LOW (ref 13.0–17.0)
MCH: 27.2 pg (ref 26.0–34.0)
MCHC: 31.2 g/dL (ref 30.0–36.0)
MCV: 87.2 fL (ref 80.0–100.0)
Platelets: 201 K/uL (ref 150–400)
RBC: 2.35 MIL/uL — ABNORMAL LOW (ref 4.22–5.81)
RDW: 19.1 % — ABNORMAL HIGH (ref 11.5–15.5)
WBC: 6.9 K/uL (ref 4.0–10.5)
nRBC: 0.4 % — ABNORMAL HIGH (ref 0.0–0.2)

## 2024-07-07 LAB — HEMOGLOBIN AND HEMATOCRIT, BLOOD
HCT: 22.3 % — ABNORMAL LOW (ref 39.0–52.0)
Hemoglobin: 7 g/dL — ABNORMAL LOW (ref 13.0–17.0)

## 2024-07-07 LAB — VITAMIN B12: Vitamin B-12: 556 pg/mL (ref 180–914)

## 2024-07-07 LAB — FOLATE: Folate: 9.7 ng/mL (ref >5.9)

## 2024-07-07 LAB — PREPARE RBC (CROSSMATCH)

## 2024-07-07 LAB — LACTATE DEHYDROGENASE: LDH: 147 U/L (ref 98–192)

## 2024-07-07 LAB — HEMOGLOBIN: Hemoglobin: 7.9 g/dL — ABNORMAL LOW (ref 13.0–17.0)

## 2024-07-07 MED ORDER — SODIUM CHLORIDE 0.9 % IV SOLN: INTRAVENOUS | Status: DC

## 2024-07-07 MED ORDER — LEVOTHYROXINE SODIUM 25 MCG PO TABS
25.0000 ug | ORAL_TABLET | Freq: Every day | ORAL | Status: DC
  Administered 2024-07-08 – 2024-07-11 (×3): 25 ug via ORAL
  Filled 2024-07-07 (×3): qty 1

## 2024-07-07 MED ORDER — SODIUM CHLORIDE 0.9% IV SOLUTION
Freq: Once | INTRAVENOUS | Status: DC
Start: 2024-07-07 — End: 2024-07-11
  Filled 2024-07-07: qty 250

## 2024-07-07 MED ORDER — METOPROLOL SUCCINATE ER 25 MG PO TB24
25.0000 mg | ORAL_TABLET | Freq: Every day | ORAL | Status: DC
  Administered 2024-07-07 – 2024-07-11 (×5): 25 mg via ORAL
  Filled 2024-07-07 (×5): qty 1

## 2024-07-07 MED ORDER — ATORVASTATIN CALCIUM 10 MG PO TABS
10.0000 mg | ORAL_TABLET | Freq: Every day | ORAL | Status: AC
Start: 2024-07-07 — End: ?
  Administered 2024-07-07 – 2024-07-11 (×5): 10 mg via ORAL
  Filled 2024-07-07 (×5): qty 1

## 2024-07-07 MED ORDER — PANTOPRAZOLE SODIUM 40 MG IV SOLR
40.0000 mg | Freq: Two times a day (BID) | INTRAVENOUS | Status: DC
  Administered 2024-07-07 – 2024-07-11 (×9): 40 mg via INTRAVENOUS
  Filled 2024-07-07 (×9): qty 10

## 2024-07-07 NOTE — ED Notes (Signed)
 Pt resting in bed, wife at bedside, denies any needs at this time, NAD noted, call bell within reach

## 2024-07-07 NOTE — Plan of Care (Signed)
   Problem: Education: Goal: Knowledge of General Education information will improve Description Including pain rating scale, medication(s)/side effects and non-pharmacologic comfort measures Outcome: Progressing

## 2024-07-07 NOTE — Evaluation (Addendum)
 Physical Therapy Evaluation Patient Details Name: Bryan Singleton MRN: [id number] DOB: Jan 20, 1932 Today's Date: 07/07/2024  History of Present Illness  Pt is a 89 y/o M admitted on 07/06/24 after presenting with c/o exertional chest pain, DOE. Pt is being treated for symptomatic anemia 2/2 GI bleed. PMH: HTN, HLD complicated by CAD s/p CABG, SSS s/p pacemaker, TIA, COPD  Clinical Impression  Pt recently received 2nd blood transfusion. Pt seen for PT evaluation with pt agreeable, wife present for first half of PT session. Prior to admission pt was independent without AD but furniture walks, notes 1 fall prior to admission. On this date, pt is able to ambulate in hallway with IV pole & CGA<>min assist, 2 standing breaks over the course of ~50 ft 2/2 SOB, SpO2 100% on room air after gait. Pt reports his overall endurance has decreased. Educated pt on recommendation to use RW vs furniture walking. Will continue to follow pt acutely to progress gait with LRAD, safety with mobility.         If plan is discharge home, recommend the following: A little help with walking and/or transfers;A little help with bathing/dressing/bathroom;Assistance with cooking/housework;Assist for transportation;Help with stairs or ramp for entrance   Can travel by private vehicle        Equipment Recommendations None recommended by PT  Recommendations for Other Services       Functional Status Assessment Patient has had a recent decline in their functional status and demonstrates the ability to make significant improvements in function in a reasonable and predictable amount of time.     Precautions / Restrictions Precautions Precautions: Fall Restrictions Weight Bearing Restrictions Per Provider Order: No      Mobility  Bed Mobility Overal bed mobility: Modified Independent Bed Mobility: Supine to Sit     Supine to sit: Modified independent (Device/Increase time), HOB elevated, Used rails (exit L side of  bed, uses bed rail)          Transfers Overall transfer level: Needs assistance Equipment used: None Transfers: Sit to/from Stand Sit to Stand: Contact guard assist           General transfer comment: sit>stand from EOB with HHA    Ambulation/Gait Ambulation/Gait assistance: Min assist, Contact guard assist Gait Distance (Feet): 50 Feet Assistive device: IV Pole Gait Pattern/deviations: Decreased step length - right, Decreased step length - left, Decreased stride length Gait velocity: decreased     General Gait Details: elects to hold to IV pole with RUE  Stairs            Wheelchair Mobility     Tilt Bed    Modified Rankin (Stroke Patients Only)       Balance Overall balance assessment: Needs assistance Sitting-balance support: Feet supported Sitting balance-Leahy Scale: Good Sitting balance - Comments: supervision sitting EOB   Standing balance support: Single extremity supported, During functional activity, Reliant on assistive device for balance Standing balance-Leahy Scale: Fair                               Pertinent Vitals/Pain Pain Assessment Pain Assessment: No/denies pain    Home Living Family/patient expects to be discharged to:: Private residence Living Arrangements: Spouse/significant other Available Help at Discharge: Family Type of Home: House Home Access: Stairs to enter Entrance Stairs-Rails: Lawyer of Steps: 3-4   Home Layout: One level Home Equipment: Cane - single Librarian, academic (2 wheels)  Prior Function               Mobility Comments: Ambulatory without AD, 1 fall in the past 6 months ADLs Comments: No longer drives, independent with bathing & dressing     Extremity/Trunk Assessment   Upper Extremity Assessment Upper Extremity Assessment: Overall WFL for tasks assessed    Lower Extremity Assessment Lower Extremity Assessment: Generalized weakness        Communication   Communication Communication: Impaired Factors Affecting Communication: Hearing impaired (hearing aides donned during session)    Cognition Arousal: Alert Behavior During Therapy: WFL for tasks assessed/performed   PT - Cognitive impairments: History of cognitive impairments                         Following commands: Intact       Cueing Cueing Techniques: Verbal cues     General Comments General comments (skin integrity, edema, etc.): SpO2 100% on room air after gait, no c/o dizziness/lightheadedness/adverse symptoms during session    Exercises     Assessment/Plan    PT Assessment Patient needs continued PT services  PT Problem List Decreased strength;Decreased activity tolerance;Decreased balance;Decreased mobility;Decreased knowledge of precautions;Decreased safety awareness;Decreased knowledge of use of DME       PT Treatment Interventions Balance training;DME instruction;Neuromuscular re-education;Gait training;Stair training;Functional mobility training;Therapeutic activities;Therapeutic exercise;Wheelchair mobility training;Patient/family education    PT Goals (Current goals can be found in the Care Plan section)  Acute Rehab PT Goals Patient Stated Goal: get better, go home PT Goal Formulation: With patient Time For Goal Achievement: 07/21/24 Potential to Achieve Goals: Good    Frequency Min 2X/week     Co-evaluation               AM-PAC PT 6 Clicks Mobility  Outcome Measure Help needed turning from your back to your side while in a flat bed without using bedrails?: None Help needed moving from lying on your back to sitting on the side of a flat bed without using bedrails?: None Help needed moving to and from a bed to a chair (including a wheelchair)?: A Little Help needed standing up from a chair using your arms (e.g., wheelchair or bedside chair)?: A Little Help needed to walk in hospital room?: A Little Help needed  climbing 3-5 steps with a railing? : A Little 6 Click Score: 20    End of Session   Activity Tolerance: Patient tolerated treatment well Patient left: in bed;with call bell/phone within reach;with bed alarm set   PT Visit Diagnosis: Unsteadiness on feet (R26.81);Muscle weakness (generalized) (M62.81);Other abnormalities of gait and mobility (R26.89)    Time: 8866-8852 PT Time Calculation (min) (ACUTE ONLY): 14 min   Charges:   PT Evaluation $PT Eval Moderate Complexity: 1 Mod   PT General Charges $$ ACUTE PT VISIT: 1 Visit         Richerd Pinal, PT, DPT 07/07/24, 11:54 AM   Richerd CHRISTELLA Pinal 07/07/2024, 11:52 AM

## 2024-07-07 NOTE — Progress Notes (Signed)
 PROGRESS NOTE    Bryan Singleton  FMW:[id number] DOB: 1931/10/09 DOA: 07/06/2024 PCP: Fernande Ophelia [name], MD     Brief Narrative:   Bryan Singleton is a 89 y.o. year old male with past medical history of hypertension, hyperlipidemia complicated by CAD status post CABG, sick sinus syndrome status post pacemaker, history of TIA presenting to the ED with exertional chest pain, and few weeks of exertional dyspnea on exertion.   Patient states he has been having worsening shortness of breath over the last few weeks and fatigue as well.  He developed exertional chest pain that resolved without intervention.   Patient states he has not had a colonoscopy or endoscopy previously and chart review shows no record of either.  Patient defers most of his medical questions and his medications to his wife.  He states he followed with his PCP a few days ago and was scheduled to get some blood work today but had this episode.   Assessment & Plan:   Principal Problem:   Symptomatic anemia Active Problems:   AKI (acute kidney injury)   SSS (sick sinus syndrome) (HCC)   Severe tricuspid valve insufficiency   Presence of permanent cardiac pacemaker   Controlled type 2 diabetes mellitus with complication, without long-term current use of insulin  (HCC)   Peripheral vascular disease   Coronary artery disease s/p CABG x 5   Paroxysmal atrial fibrillation (HCC)   Benign essential hypertension   COPD (chronic obstructive pulmonary disease) (HCC)   Gout  # Iron deficiency anemia Subacute, had normal hgb earlier this year, 7.9 last week, 6s here. S/p 1 unit and now a second ordered. No melena or other bleeding. Is on apixaban  - holding apixaban  - given cad transfuse to maintain hgb above 8 - GI to see - continue PPI but make it BID - npo for now  # Syncope Likely 2/2 above. CT head negative.  - monitor  # CAD S/p cabg x5. No chest pain, trops mildly elevated and stable - holding apixaban  as  above - home atorva  # HFpEF Euvolemic - hold home lasix for now  # Hypothyroid - home levothyroxine   # A-fib Rate controlled  # SSS S/p pacemaker  # HTN Bp wnl - holding home lisinopril  - cont home metop  # CKD 3b At baseline - trend     DVT prophylaxis: SCDs Code Status: dnr/dni confirmed w/ patient and wife at bedside on 10/10 Family Communication: wife at bedside 10/10  Level of care: Progressive Status is: Inpatient Remains inpatient appropriate because: severity of illness    Consultants:  GI  Procedures: pending  Antimicrobials:  none    Subjective: Reports feeling well, no pain or dyspnea  Objective: Vitals:   07/07/24 0440 07/07/24 0800 07/07/24 0815 07/07/24 0832  BP: 124/66 118/64 118/64 111/69  Pulse: 80 88 74 79  Resp: 18 (!) 21 18 18   Temp: 98.1 F (36.7 C)  97.8 F (36.6 C) 97.8 F (36.6 C)  TempSrc: Oral  Oral Oral  SpO2: 100% 100% 98% 97%  Weight:      Height:        Intake/Output Summary (Last 24 hours) at 07/07/2024 0933 Last data filed at 07/07/2024 0825 Gross per 24 hour  Intake 190.83 ml  Output 525 ml  Net -334.17 ml   Filed Weights   07/06/24 1050  Weight: 79 kg    Examination:  General exam: Appears calm and comfortable  Respiratory system: Clear to auscultation. Respiratory  effort normal. Cardiovascular system: S1 & S2 heard, RRR. Soft systolic murmur Gastrointestinal system: Abdomen is nondistended, soft and nontender. No organomegaly or masses felt.   Central nervous system: Alert and oriented. No focal neurological deficits. Extremities: Symmetric 5 x 5 power. Skin: No rashes, lesions or ulcers Psychiatry: Judgement and insight appear normal. Mood & affect appropriate.     Data Reviewed: I have personally reviewed following labs and imaging studies  CBC: Recent Labs  Lab 07/06/24 1056 07/06/24 1836 07/07/24 0051 07/07/24 0207  WBC 7.5  --   --  6.9  HGB 6.5* 7.0* 7.0* 6.4*  HCT 21.8*  22.7* 22.3* 20.5*  MCV 91.2  --   --  87.2  PLT 254  --   --  201   Basic Metabolic Panel: Recent Labs  Lab 07/06/24 1056 07/07/24 0051  NA 137 139  K 4.0 4.1  CL 102 104  CO2 24 25  GLUCOSE 132* 104*  BUN 53* 54*  CREATININE 1.49* 1.45*  CALCIUM  8.8* 8.6*   GFR: Estimated Creatinine Clearance: 37.8 mL/min (A) (by C-G formula based on SCr of 1.45 mg/dL (H)). Liver Function Tests: No results for input(s): AST, ALT, ALKPHOS, BILITOT, PROT, ALBUMIN in the last 168 hours. No results for input(s): LIPASE, AMYLASE in the last 168 hours. No results for input(s): AMMONIA in the last 168 hours. Coagulation Profile: Recent Labs  Lab 07/06/24 1056  INR 2.4*   Cardiac Enzymes: No results for input(s): CKTOTAL, CKMB, CKMBINDEX, TROPONINI in the last 168 hours. BNP (last 3 results) No results for input(s): PROBNP in the last 8760 hours. HbA1C: No results for input(s): HGBA1C in the last 72 hours. CBG: No results for input(s): GLUCAP in the last 168 hours. Lipid Profile: No results for input(s): CHOL, HDL, LDLCALC, TRIG, CHOLHDL, LDLDIRECT in the last 72 hours. Thyroid  Function Tests: No results for input(s): TSH, T4TOTAL, FREET4, T3FREE, THYROIDAB in the last 72 hours. Anemia Panel: Recent Labs    07/06/24 1258 07/06/24 1836 07/07/24 0051  VITAMINB12  --   --  556  FOLATE  --   --  9.7  FERRITIN 11*  --   --   TIBC 455*  --   --   IRON 21*  --   --   RETICCTPCT  --  3.9*  --    Urine analysis: No results found for: COLORURINE, APPEARANCEUR, LABSPEC, PHURINE, GLUCOSEU, HGBUR, BILIRUBINUR, KETONESUR, PROTEINUR, UROBILINOGEN, NITRITE, LEUKOCYTESUR Sepsis Labs: @LABRCNTIP (procalcitonin:4,lacticidven:4)  )No results found for this or any previous visit (from the past 240 hours).       Radiology Studies: DG Chest 2 View Result Date: 07/06/2024 CLINICAL DATA:  CP EXAM: PORTABLE CHEST 1 VIEW  COMPARISON:  CT chest, 08/19/2024. FINDINGS: Support lines; LEFT chest, subclavian-approach pacer with intact single lead. Borderline cardiac enlargement. Aortic arch atherosclerosis. Coronary bypass. Lungs are well inflated. No focal consolidation or mass. No pleural effusion or pneumothorax. Median sternotomy change. No acute displaced fracture. IMPRESSION: 1. Mild cardiomegaly without acute superimposed cardiopulmonary process 2.  Aortic Atherosclerosis (ICD10-I70.0). Electronically Signed   By: Thom Hall M.D.   On: 07/06/2024 11:56        Scheduled Meds:  sodium chloride    Intravenous Once   pantoprazole (PROTONIX) IV  40 mg Intravenous Q24H   Continuous Infusions:   LOS: 1 day     Devaughn KATHEE Ban, MD [facility]   If 7PM-7AM, please contact night-coverage [email or web address] Password [email or web address] 07/07/2024, 9:33 AM

## 2024-07-07 NOTE — Consult Note (Addendum)
 [facility] GI Inpatient Consult Note   Ladell Francis Boss, M.D.  Reason for Consult: Symptomatic anemia, hematochezia   Attending Requesting Consult: Devaughn Ban, MD   History of Present Illness: Bryan Singleton is a 89 y.o. male who presents with symptomatic anemia with symptoms of dyspnea on exertion, fatigue and several falls over the past week.  Patient had initially denied any gross gastrointestinal bleeding, however, examination per the ER physician suggested some bright red blood in the rectal vault that was grossly heme positive.  Patient does remark he had a colonoscopy several years ago, possibly here at the [facility] and was told that I do not need any more colonoscopies.  He was unable to tell me the exact findings of his procedure. The patient came in with some exertional chest pain along with shortness of breath. He was admitted with a hemoglobin of 6.5 and had 1 unit of blood transfused.  Chest x-ray was clear without evidence of pneumonia or pulmonary edema. The GI service is being asked to see the patient to consider endoluminal evaluation to find the origin of gastrointestinal bleeding.       Past Medical History:  Past Medical History:  Diagnosis Date   COPD (chronic obstructive pulmonary disease) (HCC)    Coronary artery disease    Hypertension    Presence of permanent cardiac pacemaker     Problem List: Patient Active Problem List   Diagnosis Date Noted   Gout 07/07/2024   Symptomatic anemia 07/06/2024   Paroxysmal atrial fibrillation (HCC) 07/06/2024   Fall 08/21/2023   Syncope and collapse 08/19/2023   AKI (acute kidney injury) 08/19/2023   Generalized weakness 08/19/2023   Long term (current) use of warfarin 08/19/2023   SSS (sick sinus syndrome) (HCC) 08/19/2023   Hx of CABG x 5 08/19/2023   Subtherapeutic international normalized ratio (INR) 08/19/2023   Presence of permanent cardiac pacemaker    Coronary  artery disease s/p CABG x 5    Controlled type 2 diabetes mellitus with complication, without long-term current use of insulin  (HCC) 03/03/2021   Chronic systolic CHF (congestive heart failure), NYHA class 2 (HCC) 04/19/2017   History of TIAs 02/05/2016   Peripheral vascular disease 02/05/2016   Severe tricuspid valve insufficiency 05/01/2015   Benign essential hypertension 04/24/2015   Moderate mitral insufficiency 09/24/2014   COPD (chronic obstructive pulmonary disease) (HCC) 03/02/2014    Past Surgical History: Past Surgical History:  Procedure Laterality Date   PACEMAKER INSERTION N/A 06/04/2020   Procedure: GENERATOR Change out;  Surgeon: Ammon Blunt, MD;  Location: [facility] ORS;  Service: Cardiovascular;  Laterality: N/A;    Allergies: Allergies  Allergen Reactions   Allegra Allergy [Fexofenadine Hcl]    Penicillins    Prednisone    Shellfish Allergy     Home Medications: (Not in a hospital admission)  Home medication reconciliation was completed with the patient.   Scheduled Inpatient Medications:    sodium chloride    Intravenous Once   atorvastatin   10 mg Oral Daily   [START ON 07/08/2024] levothyroxine   25 mcg Oral Q0600   metoprolol  succinate  25 mg Oral Daily   pantoprazole (PROTONIX) IV  40 mg Intravenous Q12H    Continuous Inpatient Infusions:    PRN Inpatient Medications:  acetaminophen  **OR** acetaminophen , albuterol   Family History: family history is not on file.   GI Family History: Negative  Social History:   reports that he has quit smoking. His smoking use included cigarettes.  He has never used smokeless tobacco. The patient denies ETOH, tobacco, or drug use.    Review of Systems: Review of Systems - Negative except history of present illness  Physical Examination: BP 127/68   Pulse 75   Temp 97.8 F (36.6 C) (Oral)   Resp 18   Ht 6' 2 (1.88 m)   Wt 79 kg   SpO2 97%   BMI 22.36 kg/m  Physical Exam Vitals reviewed.   Constitutional:      General: He is not in acute distress.    Appearance: He is normal weight. He is not ill-appearing or diaphoretic.  Eyes:     Pupils: Pupils are equal, round, and reactive to light.  Cardiovascular:     Rate and Rhythm: Normal rate. No extrasystoles are present.    Heart sounds: Normal heart sounds.     No gallop. No S4 sounds.  Pulmonary:     Breath sounds: Normal breath sounds.  Abdominal:     Palpations: Abdomen is soft. There is no mass.     Tenderness: There is no abdominal tenderness. There is no rebound.  Musculoskeletal:        General: Normal range of motion.     Cervical back: Normal range of motion.  Skin:    General: Skin is warm and dry.     Capillary Refill: Capillary refill takes less than 2 seconds.  Neurological:     General: No focal deficit present.     Mental Status: He is alert and oriented to person, place, and time.  Psychiatric:        Mood and Affect: Mood normal. Mood is not anxious.        Behavior: Behavior normal. Behavior is not agitated.     Data: Lab Results  Component Value Date   WBC 6.9 07/07/2024   HGB 6.4 (L) 07/07/2024   HCT 20.5 (L) 07/07/2024   MCV 87.2 07/07/2024   PLT 201 07/07/2024   Recent Labs  Lab 07/06/24 1836 07/07/24 0051 07/07/24 0207  HGB 7.0* 7.0* 6.4*   Lab Results  Component Value Date   NA 139 07/07/2024   K 4.1 07/07/2024   CL 104 07/07/2024   CO2 25 07/07/2024   BUN 54 (H) 07/07/2024   CREATININE 1.45 (H) 07/07/2024   No results found for: ALT, AST, GGT, ALKPHOS, BILITOT Recent Labs  Lab 07/06/24 1056  INR 2.4*      Latest Ref Rng & Units 07/07/2024    2:07 AM 07/07/2024   12:51 AM 07/06/2024    6:36 PM  CBC  WBC 4.0 - 10.5 K/uL 6.9     Hemoglobin 13.0 - 17.0 g/dL 6.4  7.0  7.0   Hematocrit 39.0 - 52.0 % 20.5  22.3  22.7   Platelets 150 - 400 K/uL 201       STUDIES: DG Chest 2 View Result Date: 07/06/2024 CLINICAL DATA:  CP EXAM: PORTABLE CHEST 1 VIEW  COMPARISON:  CT chest, 08/19/2024. FINDINGS: Support lines; LEFT chest, subclavian-approach pacer with intact single lead. Borderline cardiac enlargement. Aortic arch atherosclerosis. Coronary bypass. Lungs are well inflated. No focal consolidation or mass. No pleural effusion or pneumothorax. Median sternotomy change. No acute displaced fracture. IMPRESSION: 1. Mild cardiomegaly without acute superimposed cardiopulmonary process 2.  Aortic Atherosclerosis (ICD10-I70.0). Electronically Signed   By: Thom Hall M.D.   On: 07/06/2024 11:56   @IMAGES @  Assessment: Principal Problem:   Symptomatic anemia Active Problems:   AKI (acute kidney injury)  SSS (sick sinus syndrome) (HCC)   Severe tricuspid valve insufficiency   Peripheral vascular disease   Controlled type 2 diabetes mellitus with complication, without long-term current use of insulin  (HCC)   Presence of permanent cardiac pacemaker   Coronary artery disease s/p CABG x 5   Paroxysmal atrial fibrillation (HCC)   Benign essential hypertension   COPD (chronic obstructive pulmonary disease) (HCC)   Gout Occult positive stool with bright red blood in the rectal vault per ED examination.  89 y/o male with with history of atrial fibrillation, on chronic anticoagulation with coumadin  (INR 2.4) presents with symptomatic anemia, symptoms improved after transfusion. Consider occult causes of bleeding such as PUD, colonic malignancy, angiodysplasia of the upper and/or lower GI tract, UGI malignancy, etc.    Recommendations:  Continue holding coumadin  as considered medically feasible by the primary team. Serial H/H. IV acid suppression. EGD and colonoscopy when clinically feasible, ideally after INR is below 1.7.  The patient and his daughter, Bryan Singleton, have been counseled and appeared to understand the nature of the planned procedure, indications, risks, alternatives and potential complications but are not sure that they want to pursue  endoluminal evaluation. Patient suggested he would like to monitor his symptoms while in the hospital off Coumadin  and before he makes a final decision on pursuing sedated, invasive procedures. Will continue to follow and make further recommendations as helpful.  Thank you for the consult. Please call with questions or concerns.  Aundria Ladell Eck MD [facility] [address] [place], [place] [address] [phone]  07/07/2024 1:34 PM

## 2024-07-07 NOTE — ED Notes (Addendum)
 Pt ambulatory to the restroom and back to bed with standby assist. Pt reports he had a bowel movement that was black in color.

## 2024-07-07 NOTE — Evaluation (Signed)
 Occupational Therapy Evaluation Patient Details Name: Bryan Singleton MRN: [id number] DOB: 1931/10/18 Today's Date: 07/07/2024   History of Present Illness   Pt is a 89 y/o M admitted on 07/06/24 after presenting with c/o exertional chest pain, DOE. Pt is being treated for symptomatic anemia 2/2 GI bleed. PMH: HTN, HLD complicated by CAD s/p CABG, SSS s/p pacemaker, TIA, COPD     Clinical Impressions Bryan Singleton was seen for OT evaluation this date. Pt received seated EOB, pleased he has been upgraded to a liquid diet. Endorsing feeling much better from previous date. Pt reports he is generally MOD I for ADL/IADL management at baseline and uses a [facility] for functional mobility. Pt presents at or near baseline level of functional independence performing LB dressing and functional transfers at MOD I or supervision level. He reports using the urinal without difficulty and denies concerns for ADL management upon acute hospital DC. No additional cognitive, sensory, or visual deficits appreciated during session. Pt/spouse educated on falls prevention strategies for home/hospital, safety, and DC recs. Both return verbalize understanding. No further skilled OT needs identified. Will sign off at this time. Please re-consult if additional OT needs arise during this hospitalization.       If plan is discharge home, recommend the following:         Functional Status Assessment   Patient has not had a recent decline in their functional status     Equipment Recommendations   None recommended by OT     Recommendations for Other Services         Precautions/Restrictions   Precautions Precautions: Fall Restrictions Weight Bearing Restrictions Per Provider Order: No     Mobility Bed Mobility Overal bed mobility: Modified Independent             General bed mobility comments: seated EOB at start/end of session.    Transfers Overall transfer level: Needs  assistance Equipment used: 1 person hand held assist Transfers: Sit to/from Stand Sit to Stand: Contact guard assist           General transfer comment: sit>stand from EOB with HHA for balance, able to walk to room door and back without physical assistance, good safety awareness.      Balance Overall balance assessment: Needs assistance Sitting-balance support: Feet supported, No upper extremity supported Sitting balance-Leahy Scale: Good Sitting balance - Comments: supervision sitting EOB   Standing balance support: Single extremity supported, During functional activity, Reliant on assistive device for balance Standing balance-Leahy Scale: Fair                             ADL either performed or assessed with clinical judgement   ADL Overall ADL's : At baseline                                       General ADL Comments: Pt reporting feeling much better since admission. He is able to sit EOB to adjust bilat socks in figure four position independently. Performs STS t/f with 1 HHA for balance, no physical assist for standing. Typically uses a SPC for functional mobility 2/2 stroke in 2024. States he feels comfortable performing toilet transfers/toileting at or near baseline level of independence. No additional cognitive, sensory, or visual deficits appreciated with assessment.     Vision Ability to See in Adequate Light: 1 Impaired Patient  Visual Report: No change from baseline       Perception         Praxis         Pertinent Vitals/Pain Pain Assessment Pain Assessment: No/denies pain     Extremity/Trunk Assessment Upper Extremity Assessment Upper Extremity Assessment: Overall WFL for tasks assessed   Lower Extremity Assessment Lower Extremity Assessment: Generalized weakness       Communication Communication Communication: Impaired Factors Affecting Communication: Hearing impaired (wears hearing aids)   Cognition Arousal:  Alert Behavior During Therapy: WFL for tasks assessed/performed Cognition: No apparent impairments                               Following commands: Intact       Cueing  General Comments   Cueing Techniques: Verbal cues  SpO2 100% on room air after gait, no c/o dizziness/lightheadedness/adverse symptoms during session   Exercises Other Exercises Other Exercises: Pt/caregiver educated on role of OT in acute setting, safety, falls prevention strategies, and DC recs.   Shoulder Instructions      Home Living Family/patient expects to be discharged to:: Private residence Living Arrangements: Spouse/significant other Available Help at Discharge: Family Type of Home: House Home Access: Stairs to enter Secretary/administrator of Steps: 3-4 Entrance Stairs-Rails: Left;Right Home Layout: One level     Bathroom Shower/Tub: Producer, television/film/video: Standard     Home Equipment: Cane - single Librarian, academic (2 wheels)          Prior Functioning/Environment Prior Level of Function : Independent/Modified Independent             Mobility Comments: Ambulatory without AD, 1 fall in the past 6 months immediately PTA ADLs Comments: No longer drives, independent with ADL management.    OT Problem List: Impaired balance (sitting and/or standing);Decreased safety awareness;Decreased knowledge of use of DME or AE;Decreased strength   OT Treatment/Interventions:        OT Goals(Current goals can be found in the care plan section)   Acute Rehab OT Goals Patient Stated Goal: To go home OT Goal Formulation: All assessment and education complete, DC therapy Time For Goal Achievement: 07/07/24 Potential to Achieve Goals: Good   OT Frequency:       Co-evaluation              AM-PAC OT 6 Clicks Daily Activity     Outcome Measure Help from another person eating meals?: None Help from another person taking care of personal grooming?:  None Help from another person toileting, which includes using toliet, bedpan, or urinal?: None Help from another person bathing (including washing, rinsing, drying)?: None Help from another person to put on and taking off regular upper body clothing?: None Help from another person to put on and taking off regular lower body clothing?: None 6 Click Score: 24   End of Session Equipment Utilized During Treatment: Gait belt  Activity Tolerance: Patient tolerated treatment well Patient left: in bed;with bed alarm set (seated EOB as recieved.)  OT Visit Diagnosis: Other abnormalities of gait and mobility (R26.89);History of falling (Z91.81)                Time: 8669-8654 OT Time Calculation (min): 15 min Charges:  OT General Charges $OT Visit: 1 Visit OT Evaluation $OT Eval Moderate Complexity: 1 Mod  Jhonny Pelton, M.S., OTR/L 07/07/24, 2:46 PM

## 2024-07-07 NOTE — ED Notes (Signed)
 Fall bundle is in place

## 2024-07-07 NOTE — Consult Note (Signed)
 WOC Nurse Consult Note: Reason for Consult: R posterior arm skin tear  Wound type:partial thickness skin loss r/t trauma  Pressure Injury POA: NA  Measurement:see nursing flowsheet  Wound bed: pink  Drainage (amount, consistency, odor) some serosanguinous  Periwound: ecchymosis  Dressing procedure/placement/frequency: Cleanse R arm skin tear with NS, apply vaseline gauze ([facility]son #[id number]) to wound bed daily, cover with telfa nonstick dressing and Kerlix or silicone foam whichever is preferred/works best.   POC discussed with bedside nurse. WOC team will not follow. Re-consult if further needs arise.   Thank you,    Powell Bar MSN, RN-BC, [employer]

## 2024-07-08 DIAGNOSIS — D649 Anemia, unspecified: Secondary | ICD-10-CM | POA: Diagnosis not present

## 2024-07-08 LAB — CBC
HCT: 25.4 % — ABNORMAL LOW (ref 39.0–52.0)
Hemoglobin: 7.8 g/dL — ABNORMAL LOW (ref 13.0–17.0)
MCH: 27.5 pg (ref 26.0–34.0)
MCHC: 30.7 g/dL (ref 30.0–36.0)
MCV: 89.4 fL (ref 80.0–100.0)
Platelets: 210 K/uL (ref 150–400)
RBC: 2.84 MIL/uL — ABNORMAL LOW (ref 4.22–5.81)
RDW: 18.2 % — ABNORMAL HIGH (ref 11.5–15.5)
WBC: 7.5 K/uL (ref 4.0–10.5)
nRBC: 0.7 % — ABNORMAL HIGH (ref 0.0–0.2)

## 2024-07-08 LAB — PROTIME-INR
INR: 1.7 — ABNORMAL HIGH (ref 0.8–1.2)
Prothrombin Time: 20.6 s — ABNORMAL HIGH (ref 11.4–15.2)

## 2024-07-08 LAB — TYPE AND SCREEN
ABO/RH(D): AB POS
Antibody Screen: NEGATIVE
Unit division: 0
Unit division: 0

## 2024-07-08 LAB — CBC WITH DIFFERENTIAL/PLATELET
Abs Immature Granulocytes: 0.03 K/uL (ref 0.00–0.07)
Basophils Absolute: 0.1 K/uL (ref 0.0–0.1)
Basophils Relative: 1 %
Eosinophils Absolute: 0.2 K/uL (ref 0.0–0.5)
Eosinophils Relative: 3 %
HCT: 25.2 % — ABNORMAL LOW (ref 39.0–52.0)
Hemoglobin: 7.9 g/dL — ABNORMAL LOW (ref 13.0–17.0)
Immature Granulocytes: 0 %
Lymphocytes Relative: 22 %
Lymphs Abs: 1.6 K/uL (ref 0.7–4.0)
MCH: 27.9 pg (ref 26.0–34.0)
MCHC: 31.3 g/dL (ref 30.0–36.0)
MCV: 89 fL (ref 80.0–100.0)
Monocytes Absolute: 0.8 K/uL (ref 0.1–1.0)
Monocytes Relative: 11 %
Neutro Abs: 4.5 K/uL (ref 1.7–7.7)
Neutrophils Relative %: 63 %
Platelets: 218 K/uL (ref 150–400)
RBC: 2.83 MIL/uL — ABNORMAL LOW (ref 4.22–5.81)
RDW: 18.2 % — ABNORMAL HIGH (ref 11.5–15.5)
WBC: 7.3 K/uL (ref 4.0–10.5)
nRBC: 1 % — ABNORMAL HIGH (ref 0.0–0.2)

## 2024-07-08 LAB — BASIC METABOLIC PANEL WITH GFR
Anion gap: 10 (ref 5–15)
BUN: 49 mg/dL — ABNORMAL HIGH (ref 8–23)
CO2: 25 mmol/L (ref 22–32)
Calcium: 8.6 mg/dL — ABNORMAL LOW (ref 8.9–10.3)
Chloride: 105 mmol/L (ref 98–111)
Creatinine, Ser: 1.39 mg/dL — ABNORMAL HIGH (ref 0.61–1.24)
GFR, Estimated: 48 mL/min — ABNORMAL LOW (ref >60)
Glucose, Bld: 127 mg/dL — ABNORMAL HIGH (ref 70–99)
Potassium: 5.1 mmol/L (ref 3.5–5.1)
Sodium: 140 mmol/L (ref 135–145)

## 2024-07-08 LAB — BPAM RBC
Blood Product Expiration Date: 202511082359
Blood Product Expiration Date: 202511082359
ISSUE DATE / TIME: 202510091311
ISSUE DATE / TIME: 202510100809
Unit Type and Rh: 6200
Unit Type and Rh: 6200

## 2024-07-08 LAB — TECHNOLOGIST SMEAR REVIEW: Plt Morphology: NORMAL

## 2024-07-08 MED ORDER — DIPHENHYDRAMINE HCL 50 MG/ML IJ SOLN
12.5000 mg | Freq: Four times a day (QID) | INTRAMUSCULAR | Status: DC | PRN
  Administered 2024-07-08: 12.5 mg via INTRAVENOUS
  Filled 2024-07-08: qty 1

## 2024-07-08 MED ORDER — DIPHENHYDRAMINE HCL 12.5 MG/5ML PO ELIX: 12.5000 mg | ORAL_SOLUTION | Freq: Four times a day (QID) | ORAL | Status: DC | PRN

## 2024-07-08 MED ORDER — POLYETHYLENE GLYCOL 3350 17 GM/SCOOP PO POWD
238.0000 g | Freq: Once | ORAL | Status: AC
  Administered 2024-07-08: 238 g via ORAL
  Filled 2024-07-08: qty 238

## 2024-07-08 MED ORDER — SODIUM CHLORIDE 0.9 % IV SOLN: 12.5000 mg | Freq: Four times a day (QID) | INTRAVENOUS | Status: DC | PRN

## 2024-07-08 MED ORDER — ONDANSETRON HCL 4 MG/2ML IJ SOLN
4.0000 mg | Freq: Four times a day (QID) | INTRAMUSCULAR | Status: DC | PRN
  Administered 2024-07-08: 4 mg via INTRAVENOUS
  Filled 2024-07-08: qty 2

## 2024-07-08 MED ORDER — SODIUM CHLORIDE 0.9 % IV SOLN: INTRAVENOUS | Status: DC

## 2024-07-08 NOTE — Plan of Care (Signed)
  Problem: Clinical Measurements: Goal: Ability to maintain clinical measurements within normal limits will improve Outcome: Progressing   Problem: Coping: Goal: Level of anxiety will decrease Outcome: Progressing   Problem: Elimination: Goal: Will not experience complications related to bowel motility Outcome: Progressing

## 2024-07-08 NOTE — Progress Notes (Signed)
 [facility] Gastroenterology Inpatient Progress Note    Subjective: Patient seen for followup anemia, heme positive stool. Patient and wife are present. No rectal bleeding, but bhoth patient and wife concerned about the potential of recurrent anemia and possible bleeding without endoluminal evaluation. Patient alert and pleasant. No complaints of abdominal pain, hematochezia, nausea or vomiting.   Objective: Vital signs in last 24 hours: Temp:  [97.4 F (36.3 C)-98.2 F (36.8 C)] 97.9 F (36.6 C) (10/11 1247) Pulse Rate:  [60-83] 71 (10/11 1247) Resp:  [16-20] 18 (10/11 1247) BP: (104-143)/(57-83) 119/76 (10/11 1247) SpO2:  [96 %-100 %] 100 % (10/11 1247) Blood pressure 119/76, pulse 71, temperature 97.9 F (36.6 C), resp. rate 18, height 6' 2 (1.88 m), weight 79 kg, SpO2 100%.    Intake/Output from previous day: 10/10 0701 - 10/11 0700 In: 795.8 [P.O.:240; I.V.:240.8; Blood:315] Out: 600 [Urine:600]  Intake/Output this shift: Total I/O In: -  Out: 300 [Urine:300]   Gen: NAD. Appears comfortable.  HEENT: [place]/AT. PERRLA. Normal external ear exam.  Chest: CTA, no wheezes.  CV: RR nl S1, S2. No gallops.  Abd: soft, nt, nd. BS+  Ext: no edema. Pulses 2+  Neuro: Alert and oriented. Judgement appears normal. Nonfocal.   Lab Results: Results for orders placed or performed during the hospital encounter of 07/06/24 (from the past 24 hours)  Hemoglobin     Status: Abnormal   Collection Time: 07/07/24  2:51 PM  Result Value Ref Range   Hemoglobin 7.9 (L) 13.0 - 17.0 g/dL  Lactate dehydrogenase     Status: None   Collection Time: 07/07/24  4:10 PM  Result Value Ref Range   LDH 147 98 - 192 U/L  Hepatic function panel     Status: Abnormal   Collection Time: 07/07/24  4:10 PM  Result Value Ref Range   Total Protein 6.4 (L) 6.5 - 8.1 g/dL   Albumin 3.0 (L) 3.5 - 5.0 g/dL   AST 30 15 - 41 U/L   ALT 13 0 - 44 U/L   Alkaline Phosphatase 45 38 - 126 U/L   Total  Bilirubin 1.7 (H) 0.0 - 1.2 mg/dL   Bilirubin, Direct 0.5 (H) 0.0 - 0.2 mg/dL   Indirect Bilirubin 1.2 (H) 0.3 - 0.9 mg/dL  Basic metabolic panel with GFR     Status: Abnormal   Collection Time: 07/08/24  4:05 AM  Result Value Ref Range   Sodium 140 135 - 145 mmol/L   Potassium 5.1 3.5 - 5.1 mmol/L   Chloride 105 98 - 111 mmol/L   CO2 25 22 - 32 mmol/L   Glucose, Bld 127 (H) 70 - 99 mg/dL   BUN 49 (H) 8 - 23 mg/dL   Creatinine, Ser 8.60 (H) 0.61 - 1.24 mg/dL   Calcium  8.6 (L) 8.9 - 10.3 mg/dL   GFR, Estimated 48 (L) >60 mL/min   Anion gap 10 5 - 15  CBC     Status: Abnormal   Collection Time: 07/08/24  4:05 AM  Result Value Ref Range   WBC 7.5 4.0 - 10.5 K/uL   RBC 2.84 (L) 4.22 - 5.81 MIL/uL   Hemoglobin 7.8 (L) 13.0 - 17.0 g/dL   HCT 74.5 (L) 60.9 - 47.9 %   MCV 89.4 80.0 - 100.0 fL   MCH 27.5 26.0 - 34.0 pg   MCHC 30.7 30.0 - 36.0 g/dL   RDW 81.7 (H) 88.4 - 84.4 %   Platelets 210 150 - 400 K/uL  nRBC 0.7 (H) 0.0 - 0.2 %  Technologist smear review     Status: None   Collection Time: 07/08/24  4:05 AM  Result Value Ref Range   WBC MORPHOLOGY MORPHOLOGY UNREMARKABLE    RBC MORPHOLOGY MIXED RBC POPULATION    Plt Morphology Normal platelet morphology    Clinical Information anemia   CBC with Differential/Platelet     Status: Abnormal   Collection Time: 07/08/24  4:05 AM  Result Value Ref Range   WBC 7.3 4.0 - 10.5 K/uL   RBC 2.83 (L) 4.22 - 5.81 MIL/uL   Hemoglobin 7.9 (L) 13.0 - 17.0 g/dL   HCT 74.7 (L) 60.9 - 47.9 %   MCV 89.0 80.0 - 100.0 fL   MCH 27.9 26.0 - 34.0 pg   MCHC 31.3 30.0 - 36.0 g/dL   RDW 81.7 (H) 88.4 - 84.4 %   Platelets 218 150 - 400 K/uL   nRBC 1.0 (H) 0.0 - 0.2 %   Neutrophils Relative % 63 %   Neutro Abs 4.5 1.7 - 7.7 K/uL   Lymphocytes Relative 22 %   Lymphs Abs 1.6 0.7 - 4.0 K/uL   Monocytes Relative 11 %   Monocytes Absolute 0.8 0.1 - 1.0 K/uL   Eosinophils Relative 3 %   Eosinophils Absolute 0.2 0.0 - 0.5 K/uL   Basophils Relative 1 %    Basophils Absolute 0.1 0.0 - 0.1 K/uL   Immature Granulocytes 0 %   Abs Immature Granulocytes 0.03 0.00 - 0.07 K/uL  Protime-INR     Status: Abnormal   Collection Time: 07/08/24  8:45 AM  Result Value Ref Range   Prothrombin Time 20.6 (H) 11.4 - 15.2 seconds   INR 1.7 (H) 0.8 - 1.2     Recent Labs    07/06/24 1056 07/06/24 1836 07/07/24 0051 07/07/24 0207 07/07/24 1451 07/08/24 0405  WBC 7.5  --   --  6.9  --  7.3  7.5  HGB 6.5*   < > 7.0* 6.4* 7.9* 7.9*  7.8*  HCT 21.8*   < > 22.3* 20.5*  --  25.2*  25.4*  PLT 254  --   --  201  --  218  210   < > = values in this interval not displayed.   BMET Recent Labs    07/06/24 1056 07/07/24 0051 07/08/24 0405  NA 137 139 140  K 4.0 4.1 5.1  CL 102 104 105  CO2 24 25 25   GLUCOSE 132* 104* 127*  BUN 53* 54* 49*  CREATININE 1.49* 1.45* 1.39*  CALCIUM  8.8* 8.6* 8.6*   LFT Recent Labs    07/07/24 1610  PROT 6.4*  ALBUMIN 3.0*  AST 30  ALT 13  ALKPHOS 45  BILITOT 1.7*  BILIDIR 0.5*  IBILI 1.2*   PT/INR Recent Labs    07/06/24 1056 07/08/24 0845  LABPROT 27.7* 20.6*  INR 2.4* 1.7*   Hepatitis Panel No results for input(s): HEPBSAG, HCVAB, HEPAIGM, HEPBIGM in the last 72 hours. C-Diff No results for input(s): CDIFFTOX in the last 72 hours. No results for input(s): CDIFFPCR in the last 72 hours.   Studies/Results: No results found.  Scheduled Inpatient Medications:    sodium chloride    Intravenous Once   atorvastatin   10 mg Oral Daily   levothyroxine   25 mcg Oral Q0600   metoprolol  succinate  25 mg Oral Daily   pantoprazole (PROTONIX) IV  40 mg Intravenous Q12H    Continuous Inpatient Infusions:  PRN Inpatient Medications:  acetaminophen  **OR** acetaminophen , albuterol , diphenhydrAMINE  Miscellaneous:   Assessment:  Principal Problem:   Symptomatic anemia Active Problems:   AKI (acute kidney injury)   SSS (sick sinus syndrome) (HCC)   Severe tricuspid valve  insufficiency   Peripheral vascular disease   Controlled type 2 diabetes mellitus with complication, without long-term current use of insulin  (HCC)   Presence of permanent cardiac pacemaker   Coronary artery disease s/p CABG x 5   Paroxysmal atrial fibrillation (HCC)   Benign essential hypertension   COPD (chronic obstructive pulmonary disease) (HCC)   Gout Symptomatic anemia - Improved some with transfusion. Consider occult gastrointestinal bleeding as cause of anemia. Atrial fibrillation. Chronic anticoagulation with Eliquis . Last dose 48 hrs ago. Per wife. Syncope  Plan:  Patient and wife request EGD and colonoscopy. The patient understands the nature of the planned procedure, indications, risks, alternatives and potential complications including but not limited to bleeding, infection, perforation, damage to internal organs and possible oversedation/side effects from anesthesia. The patient agrees and gives consent to proceed.  Please refer to procedure notes for findings, recommendations and patient disposition/instructions. Continue holding Eliquis .  Jaiveer Panas K. Aundria, M.D. 07/08/2024, 1:28 PM

## 2024-07-08 NOTE — Progress Notes (Signed)
 PROGRESS NOTE    Bryan Singleton  FMW:[id number] DOB: 04/11/1932 DOA: 07/06/2024 PCP: Bryan Ophelia [name], MD     Brief Narrative:   Bryan Singleton is a 89 y.o. year old male with past medical history of hypertension, hyperlipidemia complicated by CAD status post CABG, sick sinus syndrome status post pacemaker, history of TIA presenting to the ED with exertional chest pain, and few weeks of exertional dyspnea on exertion.   Patient states he has been having worsening shortness of breath over the last few weeks and fatigue as well.  He developed exertional chest pain that resolved without intervention.   Patient states he has not had a colonoscopy or endoscopy previously and chart review shows no record of either.  Patient defers most of his medical questions and his medications to his wife.  He states he followed with his PCP a few days ago and was scheduled to get some blood work today but had this episode.   Assessment & Plan:   Principal Problem:   Symptomatic anemia Active Problems:   AKI (acute kidney injury)   SSS (sick sinus syndrome) (HCC)   Severe tricuspid valve insufficiency   Presence of permanent cardiac pacemaker   Controlled type 2 diabetes mellitus with complication, without long-term current use of insulin  (HCC)   Peripheral vascular disease   Coronary artery disease s/p CABG x 5   Paroxysmal atrial fibrillation (HCC)   Benign essential hypertension   COPD (chronic obstructive pulmonary disease) (HCC)   Gout  # Iron deficiency anemia Subacute, had normal hgb earlier this year, 7.9 last week, 6s here. S/p 2 units. Reports dark stools. On apixaban , held since Wednesday. Hgb stable upper 7s.  - holding apixaban  - given cad, consider transfusion if hgb drops much further - continue ppi - plan for endoluminal eval tomorrow - clears  # Syncope Likely 2/2 above. CT head negative.  - monitor  # CAD S/p cabg x5. No chest pain, trops mildly elevated and  stable - holding apixaban  as above - home atorva  # HFpEF Euvolemic - hold home lasix for now  # Hypothyroid - home levothyroxine   # A-fib Rate controlled  # SSS S/p pacemaker  # HTN Bp wnl - holding home lisinopril  - cont home metop  # CKD 3b At baseline - trend     DVT prophylaxis: SCDs Code Status: dnr/dni confirmed w/ patient and wife at bedside on 10/10 Family Communication: wife at bedside 10/11  Level of care: Progressive Status is: Inpatient Remains inpatient appropriate because: severity of illness    Consultants:  GI  Procedures: pending  Antimicrobials:  none    Subjective: Reports feeling well, no pain or dyspnea. Dark stool yesterday  Objective: Vitals:   07/08/24 0033 07/08/24 0501 07/08/24 0916 07/08/24 1247  BP: 122/72 121/83 135/74 119/76  Pulse: 81  60 71  Resp: 20 20 18 18   Temp: 97.7 F (36.5 C) 97.6 F (36.4 C) 98 F (36.7 C) 97.9 F (36.6 C)  TempSrc: Oral Oral    SpO2: 100% 96% 100% 100%  Weight:      Height:        Intake/Output Summary (Last 24 hours) at 07/08/2024 1339 Last data filed at 07/08/2024 9178 Gross per 24 hour  Intake 240 ml  Output 900 ml  Net -660 ml   Filed Weights   07/06/24 1050  Weight: 79 kg    Examination:  General exam: Appears calm and comfortable  Respiratory system: Clear to  auscultation. Respiratory effort normal. Cardiovascular system: S1 & S2 heard, RRR. Soft systolic murmur Gastrointestinal system: Abdomen is nondistended, soft and nontender. No organomegaly or masses felt.   Central nervous system: Alert and oriented. No focal neurological deficits. Extremities: Symmetric 5 x 5 power. Skin: No rashes, lesions or ulcers Psychiatry: Judgement and insight appear normal. Mood & affect appropriate.     Data Reviewed: I have personally reviewed following labs and imaging studies  CBC: Recent Labs  Lab 07/06/24 1056 07/06/24 1836 07/07/24 0051 07/07/24 0207  07/07/24 1451 07/08/24 0405  WBC 7.5  --   --  6.9  --  7.3  7.5  NEUTROABS  --   --   --   --   --  4.5  HGB 6.5* 7.0* 7.0* 6.4* 7.9* 7.9*  7.8*  HCT 21.8* 22.7* 22.3* 20.5*  --  25.2*  25.4*  MCV 91.2  --   --  87.2  --  89.0  89.4  PLT 254  --   --  201  --  218  210   Basic Metabolic Panel: Recent Labs  Lab 07/06/24 1056 07/07/24 0051 07/08/24 0405  NA 137 139 140  K 4.0 4.1 5.1  CL 102 104 105  CO2 24 25 25   GLUCOSE 132* 104* 127*  BUN 53* 54* 49*  CREATININE 1.49* 1.45* 1.39*  CALCIUM  8.8* 8.6* 8.6*   GFR: Estimated Creatinine Clearance: 39.5 mL/min (A) (by C-G formula based on SCr of 1.39 mg/dL (H)). Liver Function Tests: Recent Labs  Lab 07/07/24 1610  AST 30  ALT 13  ALKPHOS 45  BILITOT 1.7*  PROT 6.4*  ALBUMIN 3.0*   No results for input(s): LIPASE, AMYLASE in the last 168 hours. No results for input(s): AMMONIA in the last 168 hours. Coagulation Profile: Recent Labs  Lab 07/06/24 1056 07/08/24 0845  INR 2.4* 1.7*   Cardiac Enzymes: No results for input(s): CKTOTAL, CKMB, CKMBINDEX, TROPONINI in the last 168 hours. BNP (last 3 results) No results for input(s): PROBNP in the last 8760 hours. HbA1C: No results for input(s): HGBA1C in the last 72 hours. CBG: No results for input(s): GLUCAP in the last 168 hours. Lipid Profile: No results for input(s): CHOL, HDL, LDLCALC, TRIG, CHOLHDL, LDLDIRECT in the last 72 hours. Thyroid  Function Tests: No results for input(s): TSH, T4TOTAL, FREET4, T3FREE, THYROIDAB in the last 72 hours. Anemia Panel: Recent Labs    07/06/24 1258 07/06/24 1836 07/07/24 0051  VITAMINB12  --   --  556  FOLATE  --   --  9.7  FERRITIN 11*  --   --   TIBC 455*  --   --   IRON 21*  --   --   RETICCTPCT  --  3.9*  --    Urine analysis: No results found for: COLORURINE, APPEARANCEUR, LABSPEC, PHURINE, GLUCOSEU, HGBUR, BILIRUBINUR, KETONESUR, PROTEINUR,  UROBILINOGEN, NITRITE, LEUKOCYTESUR Sepsis Labs: @LABRCNTIP (procalcitonin:4,lacticidven:4)  )No results found for this or any previous visit (from the past 240 hours).       Radiology Studies: No results found.       Scheduled Meds:  sodium chloride    Intravenous Once   atorvastatin   10 mg Oral Daily   levothyroxine   25 mcg Oral Q0600   metoprolol  succinate  25 mg Oral Daily   pantoprazole (PROTONIX) IV  40 mg Intravenous Q12H   Continuous Infusions:   LOS: 2 days     Devaughn KATHEE Ban, MD [facility]   If 7PM-7AM, please contact  night-coverage [email or web address] Password [email or web address] 07/08/2024, 1:39 PM

## 2024-07-09 DIAGNOSIS — D649 Anemia, unspecified: Secondary | ICD-10-CM | POA: Diagnosis not present

## 2024-07-09 LAB — BASIC METABOLIC PANEL WITH GFR
Anion gap: 8 (ref 5–15)
BUN: 34 mg/dL — ABNORMAL HIGH (ref 8–23)
CO2: 25 mmol/L (ref 22–32)
Calcium: 8.2 mg/dL — ABNORMAL LOW (ref 8.9–10.3)
Chloride: 105 mmol/L (ref 98–111)
Creatinine, Ser: 1.27 mg/dL — ABNORMAL HIGH (ref 0.61–1.24)
GFR, Estimated: 54 mL/min — ABNORMAL LOW (ref >60)
Glucose, Bld: 113 mg/dL — ABNORMAL HIGH (ref 70–99)
Potassium: 4.8 mmol/L (ref 3.5–5.1)
Sodium: 138 mmol/L (ref 135–145)

## 2024-07-09 LAB — CBC
HCT: 26.1 % — ABNORMAL LOW (ref 39.0–52.0)
Hemoglobin: 8 g/dL — ABNORMAL LOW (ref 13.0–17.0)
MCH: 27.4 pg (ref 26.0–34.0)
MCHC: 30.7 g/dL (ref 30.0–36.0)
MCV: 89.4 fL (ref 80.0–100.0)
Platelets: 217 K/uL (ref 150–400)
RBC: 2.92 MIL/uL — ABNORMAL LOW (ref 4.22–5.81)
RDW: 17.8 % — ABNORMAL HIGH (ref 11.5–15.5)
WBC: 7.3 K/uL (ref 4.0–10.5)
nRBC: 0 % (ref 0.0–0.2)

## 2024-07-09 MED ORDER — POLYETHYLENE GLYCOL 3350 17 GM/SCOOP PO POWD
238.0000 g | Freq: Once | ORAL | Status: AC
  Administered 2024-07-09: 238 g via ORAL
  Filled 2024-07-09: qty 238

## 2024-07-09 NOTE — Progress Notes (Signed)
 [facility] Gastroenterology Inpatient Progress Note    Subjective: Patient seen after canceling egd and colonoscopy this AM. In good spirits, no complaints. Drank about 3/4 of bowel prep, last bm was thick brown liquid.  Objective: Vital signs in last 24 hours: Temp:  [97.3 F (36.3 C)-98.2 F (36.8 C)] 98.2 F (36.8 C) (10/12 1015) Pulse Rate:  [40-72] 72 (10/12 1045) Resp:  [17-18] 18 (10/12 0642) BP: (75-151)/(38-90) 151/90 (10/12 1045) SpO2:  [97 %-100 %] 100 % (10/12 1045) Blood pressure (!) 151/90, pulse 72, temperature 98.2 F (36.8 C), resp. rate 18, height 6' 2 (1.88 m), weight 79 kg, SpO2 100%.    Intake/Output from previous day: 10/11 0701 - 10/12 0700 In: 200.8 [I.V.:200.8] Out: 300 [Urine:300]  Intake/Output this shift: Total I/O In: -  Out: 300 [Urine:300]   Gen: NAD. Appears comfortable.  HEENT: [place]/AT. PERRLA. Normal external ear exam.  Chest: CTA, no wheezes.  CV: RR nl S1, S2. No gallops.  Abd: soft, nt, nd. BS+  Ext: no edema. Pulses 2+  Neuro: Alert and oriented. Judgement appears normal. Nonfocal.   Lab Results: Results for orders placed or performed during the hospital encounter of 07/06/24 (from the past 24 hours)  Basic metabolic panel with GFR     Status: Abnormal   Collection Time: 07/09/24  3:31 AM  Result Value Ref Range   Sodium 138 135 - 145 mmol/L   Potassium 4.8 3.5 - 5.1 mmol/L   Chloride 105 98 - 111 mmol/L   CO2 25 22 - 32 mmol/L   Glucose, Bld 113 (H) 70 - 99 mg/dL   BUN 34 (H) 8 - 23 mg/dL   Creatinine, Ser 8.72 (H) 0.61 - 1.24 mg/dL   Calcium  8.2 (L) 8.9 - 10.3 mg/dL   GFR, Estimated 54 (L) >60 mL/min   Anion gap 8 5 - 15  CBC     Status: Abnormal   Collection Time: 07/09/24  3:31 AM  Result Value Ref Range   WBC 7.3 4.0 - 10.5 K/uL   RBC 2.92 (L) 4.22 - 5.81 MIL/uL   Hemoglobin 8.0 (L) 13.0 - 17.0 g/dL   HCT 73.8 (L) 60.9 - 47.9 %   MCV 89.4 80.0 - 100.0 fL   MCH 27.4 26.0 - 34.0 pg   MCHC 30.7 30.0 -  36.0 g/dL   RDW 82.1 (H) 88.4 - 84.4 %   Platelets 217 150 - 400 K/uL   nRBC 0.0 0.0 - 0.2 %     Recent Labs    07/07/24 0207 07/07/24 1451 07/08/24 0405 07/09/24 0331  WBC 6.9  --  7.3  7.5 7.3  HGB 6.4* 7.9* 7.9*  7.8* 8.0*  HCT 20.5*  --  25.2*  25.4* 26.1*  PLT 201  --  218  210 217   BMET Recent Labs    07/07/24 0051 07/08/24 0405 07/09/24 0331  NA 139 140 138  K 4.1 5.1 4.8  CL 104 105 105  CO2 25 25 25   GLUCOSE 104* 127* 113*  BUN 54* 49* 34*  CREATININE 1.45* 1.39* 1.27*  CALCIUM  8.6* 8.6* 8.2*   LFT Recent Labs    07/07/24 1610  PROT 6.4*  ALBUMIN 3.0*  AST 30  ALT 13  ALKPHOS 45  BILITOT 1.7*  BILIDIR 0.5*  IBILI 1.2*   PT/INR Recent Labs    07/08/24 0845  LABPROT 20.6*  INR 1.7*   Hepatitis Panel No results for input(s): HEPBSAG, HCVAB, HEPAIGM, HEPBIGM in the last  72 hours. C-Diff No results for input(s): CDIFFTOX in the last 72 hours. No results for input(s): CDIFFPCR in the last 72 hours.   Studies/Results: No results found.  Scheduled Inpatient Medications:    sodium chloride    Intravenous Once   atorvastatin   10 mg Oral Daily   levothyroxine   25 mcg Oral Q0600   metoprolol  succinate  25 mg Oral Daily   pantoprazole (PROTONIX) IV  40 mg Intravenous Q12H   polyethylene glycol powder  238 g Oral Once    Continuous Inpatient Infusions:    sodium chloride  20 mL/hr at 07/08/24 1714   promethazine (PHENERGAN) injection (IM or IVPB)      PRN Inpatient Medications:  acetaminophen  **OR** acetaminophen , albuterol , diphenhydrAMINE, ondansetron  (ZOFRAN ) IV, promethazine (PHENERGAN) injection (IM or IVPB)  Miscellaneous: N/A  Assessment:  Principal Problem:   Symptomatic anemia Active Problems:   AKI (acute kidney injury)   SSS (sick sinus syndrome) (HCC)   Severe tricuspid valve insufficiency   Peripheral vascular disease   Controlled type 2 diabetes mellitus with complication, without long-term current use  of insulin  (HCC)   Presence of permanent cardiac pacemaker   Coronary artery disease s/p CABG x 5   Paroxysmal atrial fibrillation (HCC)   Benign essential hypertension   COPD (chronic obstructive pulmonary disease) (HCC)   Gout 89 y/o male with with history of atrial fibrillation, on chronic anticoagulation with coumadin  (INR 2.4) presents with symptomatic anemia, symptoms improved after transfusion. Consider occult causes of bleeding such as PUD, colonic malignancy, angiodysplasia of the upper and/or lower GI tract, UGI malignancy, etc  Incomplete bowel preparation caused postponement of procedures today.  Plan:  Continue clear liquid diet today. Repeat bowel preparation this evening. Continue serial H/H.  Reschedule EGD and colonoscopy to tomorrow. Patient in agreement. Dr. Jinny to assume care beginning 7:00 am tomorrow. Call me in the interim for any issues.  Delaney Schnick K. Aundria, M.D. 07/09/2024, 1:30 PM

## 2024-07-09 NOTE — Progress Notes (Signed)
 PROGRESS NOTE    Bryan Singleton  FMW:[id number] DOB: 1931-11-25 DOA: 07/06/2024 PCP: Fernande Ophelia [name], MD     Brief Narrative:   Bryan Singleton is a 89 y.o. year old male with past medical history of hypertension, hyperlipidemia complicated by CAD status post CABG, sick sinus syndrome status post pacemaker, history of TIA presenting to the ED with exertional chest pain, and few weeks of exertional dyspnea on exertion.   Patient states he has been having worsening shortness of breath over the last few weeks and fatigue as well.  He developed exertional chest pain that resolved without intervention.   Patient states he has not had a colonoscopy or endoscopy previously and chart review shows no record of either.  Patient defers most of his medical questions and his medications to his wife.  He states he followed with his PCP a few days ago and was scheduled to get some blood work today but had this episode.   Assessment & Plan:   Principal Problem:   Symptomatic anemia Active Problems:   AKI (acute kidney injury)   SSS (sick sinus syndrome) (HCC)   Severe tricuspid valve insufficiency   Presence of permanent cardiac pacemaker   Controlled type 2 diabetes mellitus with complication, without long-term current use of insulin  (HCC)   Peripheral vascular disease   Coronary artery disease s/p CABG x 5   Paroxysmal atrial fibrillation (HCC)   Benign essential hypertension   COPD (chronic obstructive pulmonary disease) (HCC)   Gout  # Iron deficiency anemia Subacute, had normal hgb earlier this year, 7.9 last week, 6s here. S/p 2 units. Reports dark stools. On apixaban , held since Wednesday. Hgb stable around 8. - holding apixaban  - given cad, consider transfusion if hgb drops much further - continue ppi - plan for endoluminal eval tomorrow as insufficient prep overnight - clears today  # Syncope Likely 2/2 above. CT head negative.  - monitor  # CAD S/p cabg x5. No chest  pain, trops mildly elevated and stable - holding apixaban  as above - home atorva  # HFpEF Euvolemic - hold home lasix for now  # Hypothyroid - home levothyroxine   # A-fib Rate controlled - home metop - holding home apixaban   # SSS S/p pacemaker  # HTN Bp wnl - holding home lisinopril  - cont home metop  # CKD 3b At baseline - trend     DVT prophylaxis: SCDs Code Status: dnr/dni confirmed w/ patient and wife at bedside on 10/10 Family Communication: wife telephonically 10/12  Level of care: Progressive Status is: Inpatient Remains inpatient appropriate because: severity of illness    Consultants:  GI  Procedures: pending  Antimicrobials:  none    Subjective: Reports feeling well, no pain or dyspnea. No pain.   Objective: Vitals:   07/08/24 2347 07/09/24 0642 07/09/24 1015 07/09/24 1045  BP: 110/77 123/70 (!) 75/38 (!) 151/90  Pulse: (!) 40 68 (!) 41 72  Resp: 17 18    Temp: (!) 97.5 F (36.4 C) (!) 97.3 F (36.3 C) 98.2 F (36.8 C)   TempSrc: Oral     SpO2: 100% 97% 100% 100%  Weight:      Height:        Intake/Output Summary (Last 24 hours) at 07/09/2024 1114 Last data filed at 07/09/2024 0900 Gross per 24 hour  Intake 200.78 ml  Output 300 ml  Net -99.22 ml   Filed Weights   07/06/24 1050  Weight: 79 kg  Examination:  General exam: Appears calm and comfortable  Respiratory system: Clear to auscultation. Respiratory effort normal. Cardiovascular system: S1 & S2 heard, RRR. Soft systolic murmur Gastrointestinal system: Abdomen is nondistended, soft and nontender. No organomegaly or masses felt.   Central nervous system: Alert and oriented. No focal neurological deficits. Extremities: Symmetric 5 x 5 power. Skin: No rashes, lesions or ulcers Psychiatry: Judgement and insight appear normal. Mood & affect appropriate.     Data Reviewed: I have personally reviewed following labs and imaging studies  CBC: Recent Labs  Lab  07/06/24 1056 07/06/24 1836 07/07/24 0051 07/07/24 0207 07/07/24 1451 07/08/24 0405 07/09/24 0331  WBC 7.5  --   --  6.9  --  7.3  7.5 7.3  NEUTROABS  --   --   --   --   --  4.5  --   HGB 6.5* 7.0* 7.0* 6.4* 7.9* 7.9*  7.8* 8.0*  HCT 21.8* 22.7* 22.3* 20.5*  --  25.2*  25.4* 26.1*  MCV 91.2  --   --  87.2  --  89.0  89.4 89.4  PLT 254  --   --  201  --  218  210 217   Basic Metabolic Panel: Recent Labs  Lab 07/06/24 1056 07/07/24 0051 07/08/24 0405 07/09/24 0331  NA 137 139 140 138  K 4.0 4.1 5.1 4.8  CL 102 104 105 105  CO2 24 25 25 25   GLUCOSE 132* 104* 127* 113*  BUN 53* 54* 49* 34*  CREATININE 1.49* 1.45* 1.39* 1.27*  CALCIUM  8.8* 8.6* 8.6* 8.2*   GFR: Estimated Creatinine Clearance: 43.2 mL/min (A) (by C-G formula based on SCr of 1.27 mg/dL (H)). Liver Function Tests: Recent Labs  Lab 07/07/24 1610  AST 30  ALT 13  ALKPHOS 45  BILITOT 1.7*  PROT 6.4*  ALBUMIN 3.0*   No results for input(s): LIPASE, AMYLASE in the last 168 hours. No results for input(s): AMMONIA in the last 168 hours. Coagulation Profile: Recent Labs  Lab 07/06/24 1056 07/08/24 0845  INR 2.4* 1.7*   Cardiac Enzymes: No results for input(s): CKTOTAL, CKMB, CKMBINDEX, TROPONINI in the last 168 hours. BNP (last 3 results) No results for input(s): PROBNP in the last 8760 hours. HbA1C: No results for input(s): HGBA1C in the last 72 hours. CBG: No results for input(s): GLUCAP in the last 168 hours. Lipid Profile: No results for input(s): CHOL, HDL, LDLCALC, TRIG, CHOLHDL, LDLDIRECT in the last 72 hours. Thyroid  Function Tests: No results for input(s): TSH, T4TOTAL, FREET4, T3FREE, THYROIDAB in the last 72 hours. Anemia Panel: Recent Labs    07/06/24 1258 07/06/24 1836 07/07/24 0051  VITAMINB12  --   --  556  FOLATE  --   --  9.7  FERRITIN 11*  --   --   TIBC 455*  --   --   IRON 21*  --   --   RETICCTPCT  --  3.9*  --    Urine  analysis: No results found for: COLORURINE, APPEARANCEUR, LABSPEC, PHURINE, GLUCOSEU, HGBUR, BILIRUBINUR, KETONESUR, PROTEINUR, UROBILINOGEN, NITRITE, LEUKOCYTESUR Sepsis Labs: @LABRCNTIP (procalcitonin:4,lacticidven:4)  )No results found for this or any previous visit (from the past 240 hours).       Radiology Studies: No results found.       Scheduled Meds:  sodium chloride    Intravenous Once   atorvastatin   10 mg Oral Daily   levothyroxine   25 mcg Oral Q0600   metoprolol  succinate  25 mg Oral Daily  pantoprazole (PROTONIX) IV  40 mg Intravenous Q12H   polyethylene glycol powder  238 g Oral Once   Continuous Infusions:  sodium chloride  20 mL/hr at 07/08/24 1714   promethazine (PHENERGAN) injection (IM or IVPB)       LOS: 3 days     Devaughn KATHEE Ban, MD [facility]   If 7PM-7AM, please contact night-coverage [email or web address] Password [email or web address] 07/09/2024, 11:14 AM

## 2024-07-09 NOTE — Plan of Care (Signed)
   Problem: Clinical Measurements: Goal: Ability to maintain clinical measurements within normal limits will improve Outcome: Progressing   Problem: Nutrition: Goal: Adequate nutrition will be maintained Outcome: Progressing   Problem: Elimination: Goal: Will not experience complications related to bowel motility Outcome: Progressing

## 2024-07-09 NOTE — TOC Initial Note (Signed)
 Transition of Care [facility]) - Initial/Assessment Note    Patient Details  Name: Bryan Singleton MRN: [id number] Date of Birth: [birth year]  [facility]) CM/SW Contact:    Victory Jackquline RAMAN, RN Phone Number: 07/09/2024, 4:59 PM  Clinical Narrative:      RNCM, spoke with the patient at the bedside. I introduced myself, my role, and explained that discharge planning recommendations would be discussed. PT recommended Home with Home Health/PT.Patient in agreement for HH/PT. Patient states he has VA benefits and he goes to the [facility] in [place] and that he has been in touch with them. VA benefits to be verified.  Patient lives with his wife that will be picking him up at the time of discharge. He states that he has DME at home: [employer] and [employer].  RNCM will continue to follow for discharge planning/care coordination and update as applicable.       Expected Discharge Plan: Home w Home Health Services Barriers to Discharge: Continued Medical Work up   Patient Goals and CMS Choice            Expected Discharge Plan and Services       Living arrangements for the past 2 months: Single Family Home                                      Prior Living Arrangements/Services Living arrangements for the past 2 months: Single Family Home Lives with:: Spouse Patient language and need for interpreter reviewed:: Yes Do you feel safe going back to the place where you live?: Yes      Need for Family Participation in Patient Care: Yes (Comment) Care giver support system in place?: Yes (comment) Current home services: DME Criminal Activity/Legal Involvement Pertinent to Current Situation/Hospitalization: No - Comment as needed  Activities of Daily Living      Permission Sought/Granted                  Emotional Assessment Appearance:: Well-Groomed, Appears stated age Attitude/Demeanor/Rapport: Self-Confident, Engaged, Gracious Affect (typically observed): Calm,  Quiet, Pleasant Orientation: : Oriented to Self, Oriented to Place, Oriented to  Time, Oriented to Situation Alcohol / Substance Use: Not Applicable Psych Involvement: No (comment)  Admission diagnosis:  Acute blood loss anemia [D62] Symptomatic anemia [D64.9] Patient Active Problem List   Diagnosis Date Noted   Gout 07/07/2024   Symptomatic anemia 07/06/2024   Paroxysmal atrial fibrillation (HCC) 07/06/2024   Fall 08/21/2023   Syncope and collapse 08/19/2023   AKI (acute kidney injury) 08/19/2023   Generalized weakness 08/19/2023   Long term (current) use of warfarin 08/19/2023   SSS (sick sinus syndrome) (HCC) 08/19/2023   Hx of CABG x 5 08/19/2023   Subtherapeutic international normalized ratio (INR) 08/19/2023   Presence of permanent cardiac pacemaker    Coronary artery disease s/p CABG x 5    Controlled type 2 diabetes mellitus with complication, without long-term current use of insulin  (HCC) 03/03/2021   Chronic systolic CHF (congestive heart failure), NYHA class 2 (HCC) 04/19/2017   History of TIAs 02/05/2016   Peripheral vascular disease 02/05/2016   Severe tricuspid valve insufficiency 05/01/2015   Benign essential hypertension 04/24/2015   Moderate mitral insufficiency 09/24/2014   COPD (chronic obstructive pulmonary disease) (HCC) 03/02/2014   PCP:  Fernande Ophelia [name], MD Pharmacy:   [facility] [phone] [address], [place] - [address]  [address] [place] [address] Phone: [phone] Fax: [phone]  [facility] [address] [place] [address] Phone: [phone] Fax: [phone]     Social Drivers of Health (SDOH) Social History: SDOH Screenings   Food Insecurity: No Food Insecurity (07/08/2024)  Housing: Low Risk  (07/08/2024)  Transportation Needs: No Transportation Needs (07/08/2024)  Utilities: Not At Risk (07/08/2024)  Financial Resource Strain: Low Risk  (12/27/2023)   Received from  [facility]  Social Connections: Patient Declined (07/08/2024)  Tobacco Use: Medium Risk (07/06/2024)   SDOH Interventions:     Readmission Risk Interventions     No data to display

## 2024-07-09 NOTE — Progress Notes (Signed)
   07/08/24 2055  Provider Notification  Provider Name/Title H. Cleatus, MD  Date Provider Notified 07/08/24  Time Provider Notified 2055  Method of Notification Page (secure chat)  Notification Reason Change in status (patient vomitting, need order for zofran )  Provider response See new orders  Date of Provider Response 07/08/24  Time of Provider Response 2056   Patient unable to tolerate the rest of bowel prep. 275 ml left. Stool brown with small pieces.

## 2024-07-10 ENCOUNTER — Inpatient Hospital Stay: Payer: Self-pay | Admitting: General Practice

## 2024-07-10 ENCOUNTER — Encounter
Admission: EM | Disposition: A | Discharge status: Home / Self Care | Payer: Self-pay | Source: Home / Self Care | Attending: Obstetrics and Gynecology

## 2024-07-10 ENCOUNTER — Encounter: Payer: Self-pay | Admitting: General Practice

## 2024-07-10 ENCOUNTER — Encounter: Payer: Self-pay | Admitting: Internal Medicine

## 2024-07-10 DIAGNOSIS — K921 Melena: Secondary | ICD-10-CM

## 2024-07-10 DIAGNOSIS — D122 Benign neoplasm of ascending colon: Secondary | ICD-10-CM | POA: Diagnosis not present

## 2024-07-10 DIAGNOSIS — K5520 Angiodysplasia of colon without hemorrhage: Secondary | ICD-10-CM | POA: Diagnosis not present

## 2024-07-10 DIAGNOSIS — D509 Iron deficiency anemia, unspecified: Secondary | ICD-10-CM

## 2024-07-10 DIAGNOSIS — D649 Anemia, unspecified: Secondary | ICD-10-CM | POA: Diagnosis not present

## 2024-07-10 DIAGNOSIS — K5730 Diverticulosis of large intestine without perforation or abscess without bleeding: Secondary | ICD-10-CM

## 2024-07-10 DIAGNOSIS — K449 Diaphragmatic hernia without obstruction or gangrene: Secondary | ICD-10-CM

## 2024-07-10 DIAGNOSIS — D62 Acute posthemorrhagic anemia: Principal | ICD-10-CM

## 2024-07-10 DIAGNOSIS — K635 Polyp of colon: Secondary | ICD-10-CM

## 2024-07-10 DIAGNOSIS — K640 First degree hemorrhoids: Secondary | ICD-10-CM

## 2024-07-10 HISTORY — PX: HOT HEMOSTASIS: SHX5433

## 2024-07-10 HISTORY — PX: POLYPECTOMY: SHX149

## 2024-07-10 HISTORY — PX: COLONOSCOPY: SHX5424

## 2024-07-10 HISTORY — PX: ESOPHAGOGASTRODUODENOSCOPY: SHX5428

## 2024-07-10 HISTORY — PX: GIVENS CAPSULE STUDY: SHX5432

## 2024-07-10 LAB — BASIC METABOLIC PANEL WITH GFR
Anion gap: 6 (ref 5–15)
BUN: 28 mg/dL — ABNORMAL HIGH (ref 8–23)
CO2: 24 mmol/L (ref 22–32)
Calcium: 8 mg/dL — ABNORMAL LOW (ref 8.9–10.3)
Chloride: 106 mmol/L (ref 98–111)
Creatinine, Ser: 1.04 mg/dL (ref 0.61–1.24)
GFR, Estimated: >60 mL/min (ref >60)
Glucose, Bld: 93 mg/dL (ref 70–99)
Potassium: 4.9 mmol/L (ref 3.5–5.1)
Sodium: 136 mmol/L (ref 135–145)

## 2024-07-10 LAB — CBC
HCT: 24.2 % — ABNORMAL LOW (ref 39.0–52.0)
Hemoglobin: 7.7 g/dL — ABNORMAL LOW (ref 13.0–17.0)
MCH: 28.5 pg (ref 26.0–34.0)
MCHC: 31.8 g/dL (ref 30.0–36.0)
MCV: 89.6 fL (ref 80.0–100.0)
Platelets: 202 K/uL (ref 150–400)
RBC: 2.7 MIL/uL — ABNORMAL LOW (ref 4.22–5.81)
RDW: 17.9 % — ABNORMAL HIGH (ref 11.5–15.5)
WBC: 5.6 K/uL (ref 4.0–10.5)
nRBC: 0.4 % — ABNORMAL HIGH (ref 0.0–0.2)

## 2024-07-10 SURGERY — EGD (ESOPHAGOGASTRODUODENOSCOPY)
Anesthesia: General

## 2024-07-10 MED ORDER — PHENYLEPHRINE 80 MCG/ML (10ML) SYRINGE FOR IV PUSH (FOR BLOOD PRESSURE SUPPORT)
PREFILLED_SYRINGE | INTRAVENOUS | Status: AC
  Filled 2024-07-10: qty 10

## 2024-07-10 MED ORDER — PHENYLEPHRINE 80 MCG/ML (10ML) SYRINGE FOR IV PUSH (FOR BLOOD PRESSURE SUPPORT)
PREFILLED_SYRINGE | INTRAVENOUS | Status: DC | PRN
  Administered 2024-07-10: 80 ug via INTRAVENOUS
  Administered 2024-07-10 (×3): 160 ug via INTRAVENOUS
  Administered 2024-07-10: 80 ug via INTRAVENOUS

## 2024-07-10 MED ORDER — PROPOFOL 1000 MG/100ML IV EMUL
INTRAVENOUS | Status: AC
  Filled 2024-07-10: qty 100

## 2024-07-10 MED ORDER — PROPOFOL 500 MG/50ML IV EMUL
INTRAVENOUS | Status: DC | PRN
  Administered 2024-07-10: 20 mg via INTRAVENOUS
  Administered 2024-07-10: 75 ug/kg/min via INTRAVENOUS

## 2024-07-10 MED ORDER — LIDOCAINE HCL (PF) 2 % IJ SOLN
INTRAMUSCULAR | Status: AC
  Filled 2024-07-10: qty 5

## 2024-07-10 NOTE — Op Note (Signed)
 [facility] Gastroenterology Patient Name: Bryan Singleton Procedure Date: 07/10/2024 2:45 PM MRN: [id number] Account #: [id number] Date of Birth: [birth year] Admit Type: Inpatient Age: 89 Room: [facility] ENDO ROOM 4 Gender: Male Note Status: Finalized Instrument Name: Colon Scope [id number] Procedure:             Colonoscopy Indications:           Melena Providers:             Rogelia Copping MD, MD Referring MD:          Ophelia Sage, MD (Referring MD) Medicines:             Propofol  per Anesthesia Complications:         No immediate complications. Procedure:             Pre-Anesthesia Assessment:                        - Prior to the procedure, a History and Physical was                         performed, and patient medications and allergies were                         reviewed. The patient's tolerance of previous                         anesthesia was also reviewed. The risks and benefits                         of the procedure and the sedation options and risks                         were discussed with the patient. All questions were                         answered, and informed consent was obtained. Prior                         Anticoagulants: The patient has taken no anticoagulant                         or antiplatelet agents. ASA Grade Assessment: II - A                         patient with mild systemic disease. After reviewing                         the risks and benefits, the patient was deemed in                         satisfactory condition to undergo the procedure.                        After obtaining informed consent, the colonoscope was                         passed under direct vision. Throughout the procedure,  the patient's blood pressure, pulse, and oxygen                         saturations were monitored continuously. The                         Colonoscope was introduced through the anus and                          advanced to the the cecum, identified by appendiceal                         orifice and ileocecal valve. The colonoscopy was                         performed without difficulty. The patient tolerated                         the procedure well. The quality of the bowel                         preparation was excellent. Findings:      The perianal and digital rectal examinations were normal.      A 7 mm polyp was found in the ascending colon. The polyp was sessile.       The polyp was removed with a cold snare. Resection and retrieval were       complete.      Three large angiodysplastic lesions without bleeding were found in the       ascending colon. Coagulation for tissue destruction using argon plasma       at 2 liters/minute and 20 watts was successful.      Non-bleeding internal hemorrhoids were found during retroflexion. The       hemorrhoids were Grade I (internal hemorrhoids that do not prolapse).      Multiple small-mouthed diverticula were found in the sigmoid colon. Impression:            - One 7 mm polyp in the ascending colon, removed with                         a cold snare. Resected and retrieved.                        - Three non-bleeding colonic angiodysplastic lesions.                        - Non-bleeding internal hemorrhoids.                        - Diverticulosis in the sigmoid colon. Recommendation:        - Discharge patient to home.                        - Resume previous diet.                        - Continue present medications.                        - To visualize the small bowel, perform  video capsule                         endoscopy. Procedure Code(s):     --- Professional ---                        [phone], Colonoscopy, flexible; with ablation of                         tumor(s), polyp(s), or other lesion(s) (includes pre-                         and post-dilation and guide wire passage, when                         performed)                         45385, 59, Colonoscopy, flexible; with removal of                         tumor(s), polyp(s), or other lesion(s) by snare                         technique Diagnosis Code(s):     --- Professional ---                        K92.1, Melena (includes Hematochezia)                        K55.20, Angiodysplasia of colon without hemorrhage                        D12.2, Benign neoplasm of ascending colon CPT copyright 2022 American Medical Association. All rights reserved. The codes documented in this report are preliminary and upon coder review may  be revised to meet current compliance requirements. Rogelia Copping MD, MD 07/10/2024 3:32:18 PM This report has been signed electronically. Number of Addenda: 0 Note Initiated On: 07/10/2024 2:45 PM Scope Withdrawal Time: 0 hours 9 minutes 6 seconds  Total Procedure Duration: 0 hours 15 minutes 59 seconds  Estimated Blood Loss:  Estimated blood loss: none.      [facility]

## 2024-07-10 NOTE — Plan of Care (Signed)

## 2024-07-10 NOTE — Progress Notes (Signed)
 PT Cancellation Note  Patient Details Name: Bryan Singleton MRN: [id number] DOB: 1932/07/17   Cancelled Treatment:    Reason Eval/Treat Not Completed: Other (comment) Pt noted to be OTF at this time. Will f/u as able.  Richerd Pinal, PT, DPT 07/10/24, 1:32 PM   Richerd CHRISTELLA Pinal 07/10/2024, 1:31 PM

## 2024-07-10 NOTE — Transfer of Care (Signed)
 Immediate Anesthesia Transfer of Care Note  Patient: Bryan Singleton  Procedure(s) Performed: EGD (ESOPHAGOGASTRODUODENOSCOPY) COLONOSCOPY POLYPECTOMY, INTESTINE  Patient Location: PACU  Anesthesia Type:MAC  Level of Consciousness: drowsy  Airway & Oxygen Therapy: Patient Spontanous Breathing  Post-op Assessment: Report given to RN and Post -op Vital signs reviewed and stable  Post vital signs: stable  Last Vitals:  Vitals Value Taken Time  BP 86/60 07/10/24 15:40  Temp    Pulse 73 07/10/24 15:41  Resp 28 07/10/24 15:41  SpO2 99 % 07/10/24 15:41  Vitals shown include unfiled device data.  Last Pain:  Vitals:   07/10/24 1329  TempSrc: Temporal  PainSc:          Complications: No notable events documented.

## 2024-07-10 NOTE — Anesthesia Preprocedure Evaluation (Addendum)
 Anesthesia Evaluation  Patient identified by MRN, date of birth, ID band Patient awake    Reviewed: Allergy & Precautions, NPO status , Patient's Chart, lab work & pertinent test results  History of Anesthesia Complications Negative for: history of anesthetic complications  Airway Mallampati: III  TM Distance: >3 FB Neck ROM: full    Dental  (+) Partial Upper, Partial Lower   Pulmonary COPD,  COPD inhaler, former smoker   Pulmonary exam normal        Cardiovascular hypertension, + CAD, + Peripheral Vascular Disease and +CHF  Normal cardiovascular exam+ pacemaker   ECHO  IMPRESSIONS     1. Left ventricular ejection fraction, by estimation, is 50 to 55%. The  left ventricle has low normal function. The left ventricle has no regional  wall motion abnormalities. Indeterminate diastolic filling due to E-A  fusion.   2. Right ventricular systolic function is normal. The right ventricular  size is normal. There is mildly elevated pulmonary artery systolic  pressure.   3. The mitral valve is normal in structure. Mild mitral valve  regurgitation. No evidence of mitral stenosis.   4. Tricuspid valve regurgitation is mild to moderate.   5. The aortic valve is normal in structure. Aortic valve regurgitation is  not visualized. Mild aortic valve stenosis.   6. The inferior vena cava is normal in size with greater than 50%  respiratory variability, suggesting right atrial pressure of 3 mmHg.     Neuro/Psych negative neurological ROS  negative psych ROS   GI/Hepatic negative GI ROS, Neg liver ROS,,,  Endo/Other  diabetes    Renal/GU CRFRenal disease  negative genitourinary   Musculoskeletal   Abdominal   Peds  Hematology  (+) Blood dyscrasia, anemia   Anesthesia Other Findings Past Medical History: No date: COPD (chronic obstructive pulmonary disease) (HCC) No date: Coronary artery disease No date: Hypertension No  date: Presence of permanent cardiac pacemaker  Past Surgical History: 06/04/2020: PACEMAKER INSERTION; N/A     Comment:  Procedure: GENERATOR Change out;  Surgeon: Ammon Blunt, MD;  Location: [facility] ORS;  Service:               Cardiovascular;  Laterality: N/A;  BMI    Body Mass Index: 22.36 kg/m      Reproductive/Obstetrics negative OB ROS                              Anesthesia Physical Anesthesia Plan  ASA: 3  Anesthesia Plan: General   Post-op Pain Management: Minimal or no pain anticipated   Induction: Intravenous  PONV Risk Score and Plan: 1 and Propofol  infusion and TIVA  Airway Management Planned: Natural Airway and Nasal Cannula  Additional Equipment:   Intra-op Plan:   Post-operative Plan:   Informed Consent: I have reviewed the patients History and Physical, chart, labs and discussed the procedure including the risks, benefits and alternatives for the proposed anesthesia with the patient or authorized representative who has indicated his/her understanding and acceptance.    Continue DNR.   Dental Advisory Given  Plan Discussed with: Anesthesiologist, CRNA and Surgeon  Anesthesia Plan Comments: (Patient consented for risks of anesthesia including but not limited to:  - adverse reactions to medications - risk of airway placement if required - damage to eyes, teeth, lips or other oral mucosa - nerve damage due to positioning  -  sore throat or hoarseness - Damage to heart, brain, nerves, lungs, other parts of body or loss of life  Patient voiced understanding and assent.)         Anesthesia Quick Evaluation

## 2024-07-10 NOTE — Anesthesia Postprocedure Evaluation (Signed)
 Anesthesia Post Note  Patient: Bryan Singleton  Procedure(s) Performed: EGD (ESOPHAGOGASTRODUODENOSCOPY) COLONOSCOPY POLYPECTOMY, INTESTINE  Patient location during evaluation: PACU Anesthesia Type: General Level of consciousness: awake and alert Pain management: pain level controlled Vital Signs Assessment: post-procedure vital signs reviewed and stable Respiratory status: spontaneous breathing, nonlabored ventilation and respiratory function stable Cardiovascular status: blood pressure returned to baseline and stable Postop Assessment: no apparent nausea or vomiting Anesthetic complications: no   No notable events documented.   Last Vitals:  Vitals:   07/10/24 1555 07/10/24 1626  BP: (!) 115/95 118/72  Pulse:  78  Resp:  19  Temp:  (!) 36.3 C  SpO2:  100%    Last Pain:  Vitals:   07/10/24 1626  TempSrc: Oral  PainSc: 0-No pain                 Camellia Merilee Louder

## 2024-07-10 NOTE — Care Management Important Message (Signed)
 Important Message  Patient Details  Name: Bryan Singleton MRN: [id number] Date of Birth: 1932-09-01   Important Message Given:  Yes - Medicare IM     Bryan Singleton 07/10/2024, 3:45 PM

## 2024-07-10 NOTE — TOC Progression Note (Signed)
 Transition of Care [facility]) - Progression Note    Patient Details  Name: Bryan Singleton MRN: [id number] Date of Birth: 09-04-1932  [facility]) CM/SW Contact  Lauraine [name], LCSW Phone Number: 07/10/2024, 9:59 AM  Clinical Narrative:  Tried calling [facility] to see if patient is eligible for home health to be covered by them. Tried calling but no answer. They are likely closed today for [place] day.   Expected Discharge Plan: Home w Home Health Services Barriers to Discharge: Continued Medical Work up               Expected Discharge Plan and Services       Living arrangements for the past 2 months: Single Family Home                                       Social Drivers of Health (SDOH) Interventions SDOH Screenings   Food Insecurity: No Food Insecurity (07/08/2024)  Housing: Low Risk  (07/08/2024)  Transportation Needs: No Transportation Needs (07/08/2024)  Utilities: Not At Risk (07/08/2024)  Financial Resource Strain: Low Risk  (12/27/2023)   Received from [facility]  Social Connections: Patient Declined (07/08/2024)  Tobacco Use: Medium Risk (07/06/2024)    Readmission Risk Interventions     No data to display

## 2024-07-10 NOTE — Progress Notes (Signed)
 PROGRESS NOTE    Bryan Singleton  FMW:[id number] DOB: [birth year] DOA: 07/06/2024 PCP: Bryan Ophelia [name], MD     Brief Narrative:   Bryan Singleton is a 89 y.o. year old male with past medical history of hypertension, hyperlipidemia complicated by CAD status post CABG, sick sinus syndrome status post pacemaker, history of TIA presenting to the ED with exertional chest pain, and few weeks of exertional dyspnea on exertion.   Patient states he has been having worsening shortness of breath over the last few weeks and fatigue as well.  He developed exertional chest pain that resolved without intervention.   Patient states he has not had a colonoscopy or endoscopy previously and chart review shows no record of either.  Patient defers most of his medical questions and his medications to his wife.  He states he followed with his PCP a few days ago and was scheduled to get some blood work today but had this episode.   Assessment & Plan:   Principal Problem:   Symptomatic anemia Active Problems:   AKI (acute kidney injury)   SSS (sick sinus syndrome) (HCC)   Severe tricuspid valve insufficiency   Presence of permanent cardiac pacemaker   Controlled type 2 diabetes mellitus with complication, without long-term current use of insulin  (HCC)   Peripheral vascular disease   Coronary artery disease s/p CABG x 5   Paroxysmal atrial fibrillation (HCC)   Benign essential hypertension   COPD (chronic obstructive pulmonary disease) (HCC)   Gout   Acute blood loss anemia   Angiodysplasia of intestinal tract   Polyp of ascending colon  # Iron deficiency anemia Subacute, had normal hgb earlier this year, 7.9 last week, 6s here. S/p 2 units. Reports dark stools. On apixaban , held since Wednesday. Hgb stable around 8. EGD and colonoscopy today non-diagnostic - holding apixaban  - given cad, consider transfusion if hgb drops much further - d/c ppi - capsule study currently  # Syncope Likely 2/2  above. CT head negative.  - monitor - PT advises home health  # CAD S/p cabg x5. No chest pain, trops mildly elevated and stable - holding apixaban  as above - home atorva  # HFpEF Euvolemic - hold home lasix for now  # Hypothyroid - home levothyroxine   # A-fib Rate controlled - home metop - holding home apixaban   # SSS S/p pacemaker  # HTN Bp wnl - holding home lisinopril  - cont home metop  # CKD 3b At baseline - trend     DVT prophylaxis: SCDs Code Status: dnr/dni confirmed w/ patient and wife at bedside on 10/10 Family Communication: wife at bedside 10/13  Level of care: Progressive Status is: Inpatient Remains inpatient appropriate because: severity of illness    Consultants:  GI  Procedures: EGD, colonoscopy, capsule endoscopy  Antimicrobials:  none    Subjective: Reports feeling well, no pain or dyspnea. Tolerated both procedures  Objective: Vitals:   07/10/24 1535 07/10/24 1545 07/10/24 1555 07/10/24 1626  BP: (!) 86/60 113/64 (!) 115/95 118/72  Pulse:    78  Resp: 20   19  Temp: 98.2 F (36.8 C)   (!) 97.3 F (36.3 C)  TempSrc: Temporal   Oral  SpO2:    100%  Weight:      Height:        Intake/Output Summary (Last 24 hours) at 07/10/2024 1721 Last data filed at 07/10/2024 1540 Gross per 24 hour  Intake 500 ml  Output --  Net 500  ml   Filed Weights   07/06/24 1050  Weight: 79 kg    Examination:  General exam: Appears calm and comfortable  Respiratory system: Clear to auscultation. Respiratory effort normal. Cardiovascular system: S1 & S2 heard, RRR. Soft systolic murmur Gastrointestinal system: Abdomen is nondistended, soft and nontender. No organomegaly or masses felt.   Central nervous system: Alert and oriented. No focal neurological deficits. Extremities: Symmetric 5 x 5 power. Skin: No rashes, lesions or ulcers Psychiatry: Judgement and insight appear normal. Mood & affect appropriate.     Data Reviewed: I  have personally reviewed following labs and imaging studies  CBC: Recent Labs  Lab 07/06/24 1056 07/06/24 1836 07/07/24 0051 07/07/24 0207 07/07/24 1451 07/08/24 0405 07/09/24 0331 07/10/24 0522  WBC 7.5  --   --  6.9  --  7.3  7.5 7.3 5.6  NEUTROABS  --   --   --   --   --  4.5  --   --   HGB 6.5*   < > 7.0* 6.4* 7.9* 7.9*  7.8* 8.0* 7.7*  HCT 21.8*   < > 22.3* 20.5*  --  25.2*  25.4* 26.1* 24.2*  MCV 91.2  --   --  87.2  --  89.0  89.4 89.4 89.6  PLT 254  --   --  201  --  218  210 217 202   < > = values in this interval not displayed.   Basic Metabolic Panel: Recent Labs  Lab 07/06/24 1056 07/07/24 0051 07/08/24 0405 07/09/24 0331 07/10/24 0522  NA 137 139 140 138 136  K 4.0 4.1 5.1 4.8 4.9  CL 102 104 105 105 106  CO2 24 25 25 25 24   GLUCOSE 132* 104* 127* 113* 93  BUN 53* 54* 49* 34* 28*  CREATININE 1.49* 1.45* 1.39* 1.27* 1.04  CALCIUM  8.8* 8.6* 8.6* 8.2* 8.0*   GFR: Estimated Creatinine Clearance: 52.8 mL/min (by C-G formula based on SCr of 1.04 mg/dL). Liver Function Tests: Recent Labs  Lab 07/07/24 1610  AST 30  ALT 13  ALKPHOS 45  BILITOT 1.7*  PROT 6.4*  ALBUMIN 3.0*   No results for input(s): LIPASE, AMYLASE in the last 168 hours. No results for input(s): AMMONIA in the last 168 hours. Coagulation Profile: Recent Labs  Lab 07/06/24 1056 07/08/24 0845  INR 2.4* 1.7*   Cardiac Enzymes: No results for input(s): CKTOTAL, CKMB, CKMBINDEX, TROPONINI in the last 168 hours. BNP (last 3 results) No results for input(s): PROBNP in the last 8760 hours. HbA1C: No results for input(s): HGBA1C in the last 72 hours. CBG: No results for input(s): GLUCAP in the last 168 hours. Lipid Profile: No results for input(s): CHOL, HDL, LDLCALC, TRIG, CHOLHDL, LDLDIRECT in the last 72 hours. Thyroid  Function Tests: No results for input(s): TSH, T4TOTAL, FREET4, T3FREE, THYROIDAB in the last 72 hours. Anemia  Panel: No results for input(s): VITAMINB12, FOLATE, FERRITIN, TIBC, IRON, RETICCTPCT in the last 72 hours.  Urine analysis: No results found for: COLORURINE, APPEARANCEUR, LABSPEC, PHURINE, GLUCOSEU, HGBUR, BILIRUBINUR, KETONESUR, PROTEINUR, UROBILINOGEN, NITRITE, LEUKOCYTESUR Sepsis Labs: @LABRCNTIP (procalcitonin:4,lacticidven:4)  )No results found for this or any previous visit (from the past 240 hours).       Radiology Studies: No results found.       Scheduled Meds:  sodium chloride    Intravenous Once   atorvastatin   10 mg Oral Daily   levothyroxine   25 mcg Oral Q0600   metoprolol  succinate  25 mg Oral Daily  pantoprazole (PROTONIX) IV  40 mg Intravenous Q12H   Continuous Infusions:  promethazine (PHENERGAN) injection (IM or IVPB)       LOS: 4 days     Devaughn KATHEE Ban, MD [facility]   If 7PM-7AM, please contact night-coverage [email or web address] Password [email or web address] 07/10/2024, 5:21 PM

## 2024-07-10 NOTE — Op Note (Signed)
 [facility] Patient Name: Bryan Singleton Procedure Date: 07/10/2024 2:54 PM MRN: [id number] Account #: [id number] Date of Birth: 03/03/1932 Admit Type: Inpatient Age: 89 Room: [facility] ENDO ROOM 4 Gender: Male Note Status: Finalized Instrument Name: Upper GI Scope [phone] Procedure:             Upper GI endoscopy Indications:           Iron deficiency anemia Providers:             Rogelia Copping MD, MD Referring MD:          Ophelia Sage, MD (Referring MD) Medicines:             Propofol  per Anesthesia Complications:         No immediate complications. Procedure:             Pre-Anesthesia Assessment:                        - Prior to the procedure, a History and Physical was                         performed, and patient medications and allergies were                         reviewed. The patient's tolerance of previous                         anesthesia was also reviewed. The risks and benefits                         of the procedure and the sedation options and risks                         were discussed with the patient. All questions were                         answered, and informed consent was obtained. Prior                         Anticoagulants: The patient has taken no anticoagulant                         or antiplatelet agents. ASA Grade Assessment: II - A                         patient with mild systemic disease. After reviewing                         the risks and benefits, the patient was deemed in                         satisfactory condition to undergo the procedure.                        After obtaining informed consent, the endoscope was                         passed under direct vision. Throughout the procedure,  the patient's blood pressure, pulse, and oxygen                         saturations were monitored continuously. The Endoscope                         was introduced through the mouth, and  advanced to the                         second part of duodenum. The upper GI endoscopy was                         accomplished without difficulty. The patient tolerated                         the procedure well. Findings:      A small hiatal hernia was present.      The entire examined stomach was normal.      The examined duodenum was normal. Impression:            - Small hiatal hernia.                        - Normal stomach.                        - Normal examined duodenum.                        - No specimens collected. Recommendation:        - Return patient to hospital ward for ongoing care.                        - Resume previous diet.                        - Continue present medications.                        - Perform a colonoscopy today. Procedure Code(s):     --- Professional ---                        [phone], Esophagogastroduodenoscopy, flexible,                         transoral; diagnostic, including collection of                         specimen(s) by brushing or washing, when performed                         (separate procedure) Diagnosis Code(s):     --- Professional ---                        D50.9, Iron deficiency anemia, unspecified CPT copyright 2022 American Medical Association. All rights reserved. The codes documented in this report are preliminary and upon coder review may  be revised to meet current compliance requirements. Rogelia Copping MD, MD 07/10/2024 3:11:49 PM This report has been signed electronically. Number of Addenda: 0 Note Initiated On: 07/10/2024 2:54 PM  Estimated Blood Loss:  Estimated blood loss: none.      [facility]

## 2024-07-11 ENCOUNTER — Ambulatory Visit: Payer: Self-pay | Admitting: Gastroenterology

## 2024-07-11 ENCOUNTER — Encounter: Payer: Self-pay | Admitting: Gastroenterology

## 2024-07-11 DIAGNOSIS — D649 Anemia, unspecified: Secondary | ICD-10-CM | POA: Diagnosis not present

## 2024-07-11 LAB — CBC
HCT: 24.7 % — ABNORMAL LOW (ref 39.0–52.0)
Hemoglobin: 7.6 g/dL — ABNORMAL LOW (ref 13.0–17.0)
MCH: 27.3 pg (ref 26.0–34.0)
MCHC: 30.8 g/dL (ref 30.0–36.0)
MCV: 88.8 fL (ref 80.0–100.0)
Platelets: 215 K/uL (ref 150–400)
RBC: 2.78 MIL/uL — ABNORMAL LOW (ref 4.22–5.81)
RDW: 17.3 % — ABNORMAL HIGH (ref 11.5–15.5)
WBC: 5.6 K/uL (ref 4.0–10.5)
nRBC: 0 % (ref 0.0–0.2)

## 2024-07-11 LAB — SURGICAL PATHOLOGY

## 2024-07-11 MED ORDER — FERROUS SULFATE 325 (65 FE) MG PO TBEC: 325.0000 mg | DELAYED_RELEASE_TABLET | ORAL | 2 refills | Status: AC

## 2024-07-11 MED ORDER — FUROSEMIDE 40 MG PO TABS: 40.0000 mg | ORAL_TABLET | Freq: Every day | ORAL | 1 refills | Status: AC

## 2024-07-11 NOTE — Discharge Summary (Signed)
 Bryan Singleton FMW:[id number] DOB: Dec 30, 1931 DOA: 07/06/2024  PCP: Fernande Ophelia [name], MD  Admit date: 07/06/2024 Discharge date: 07/11/2024  Time spent: 35 minutes  Recommendations for Outpatient Follow-up:  Pcp f/u 1 week will need close monitoring of anemia. Attention to BP and volume status then as well (lisinopril  held and furosemide dose decreased) GI f/u (Dr. Jinny) in 1-2 months, needs to call Cardiology f/u as well (Dr. Florencio), needs to call     Discharge Diagnoses:  Principal Problem:   Symptomatic anemia Active Problems:   AKI (acute kidney injury)   SSS (sick sinus syndrome) (HCC)   Severe tricuspid valve insufficiency   Presence of permanent cardiac pacemaker   Controlled type 2 diabetes mellitus with complication, without long-term current use of insulin  (HCC)   Peripheral vascular disease   Coronary artery disease s/p CABG x 5   Paroxysmal atrial fibrillation (HCC)   Benign essential hypertension   COPD (chronic obstructive pulmonary disease) (HCC)   Gout   Acute blood loss anemia   Angiodysplasia of intestinal tract   Polyp of ascending colon   Discharge Condition: stable  Diet recommendation: heart healthy  Filed Weights   07/06/24 1050  Weight: 79 kg    History of present illness:  From admission h and p Bryan Singleton is a 89 y.o. year old male with past medical history of hypertension, hyperlipidemia complicated by CAD status post CABG, sick sinus syndrome status post pacemaker, history of TIA presenting to the ED with exertional chest pain, and few weeks of exertional dyspnea on exertion.   Patient states he has been having worsening shortness of breath over the last few weeks and fatigue as well.  He developed exertional chest pain that resolved without intervention.   Patient states he has not had a colonoscopy or endoscopy previously and chart review shows no record of either.  Patient defers most of his medical questions and his  medications to his wife.  He states he followed with his PCP a few days ago and was scheduled to get some blood work today but had this episode.  Hospital Course:   # Iron deficiency anemia Subacute, had normal hgb earlier this year, 7.9 last week, 6s here. S/p 2 units. Reports dark stools. On apixaban , held since Wednesday. Hgb stable around 8. EGD and colonoscopy non-diagnostic as was capsule endoscopy. Thus source of bleeding remains unknown. - discussed risks/benefits of holding versus resuming apixaban , shared decision to resume, will need close outpatient monitoring - start iron - f/u GI about 1 month - pcp f/u 1 week - d/c plan reviewed with wife and daughter who are in agreement   # Syncope Likely 2/2 above. CT head negative.  - PT advises home health, patient declines   # CAD S/p cabg x5. No chest pain, trops mildly elevated and stable - apixaban  as above - home atorva   # HFpEF Euvolemic - lasix held, resuming at lower than prior dose   # Hypothyroid - home levothyroxine    # A-fib Rate controlled - home metop, apixaban    # SSS S/p pacemaker   # HTN Bp wnl - holding home lisinopril  - cont home metop   # CKD 3b Actually improved to gfr greater than 60 with holding home lisinopril  and furosemide - hold home lisinopril  at d/c, decrease dose of home furosemide  Procedures: EGD, colonoscopy, capsule endoscopy   Consultations: GI  Discharge Exam: Vitals:   07/11/24 0720 07/11/24 1124  BP: 121/68 111/65  Pulse: 75 68  Resp: 18 18  Temp: 98.1 F (36.7 C) 98.2 F (36.8 C)  SpO2: 98% 99%    General: NAD Cardiovascular: RRR Respiratory: CTAB  Discharge Instructions   Discharge Instructions     Diet - low sodium heart healthy   Complete by: As directed    Increase activity slowly   Complete by: As directed    No wound care   Complete by: As directed       Allergies as of 07/11/2024       Reactions   Allegra Allergy [fexofenadine Hcl]     Penicillins    Prednisone    Shellfish Allergy         Medication List     PAUSE taking these medications    lisinopril  5 MG tablet Wait to take this until your doctor or other care provider tells you to start again. Commonly known as: ZESTRIL  Take 5 mg by mouth daily.       TAKE these medications    albuterol  108 (90 Base) MCG/ACT inhaler Commonly known as: VENTOLIN  HFA Inhale 2 puffs into the lungs every 6 (six) hours as needed.   apixaban  5 MG Tabs tablet Commonly known as: ELIQUIS  Take 1 tablet (5 mg total) by mouth 2 (two) times daily.   atorvastatin  10 MG tablet Commonly known as: LIPITOR Take 10 mg by mouth daily.   ferrous sulfate 325 (65 FE) MG EC tablet Take 1 tablet (325 mg total) by mouth every other day.   furosemide 40 MG tablet Commonly known as: LASIX Take 1 tablet (40 mg total) by mouth daily. What changed:  medication strength how much to take   levothyroxine  25 MCG tablet Commonly known as: SYNTHROID  Take 25 mcg by mouth daily before breakfast.   metoprolol  succinate 25 MG 24 hr tablet Commonly known as: TOPROL -XL Take 25 mg by mouth daily.   Prevagen Extra Strength 20 MG Caps Generic drug: Apoaequorin Take 20 mg by mouth daily.   triamcinolone cream 0.1 % Commonly known as: KENALOG Apply 1 Application topically 2 (two) times daily as needed (itch).       Allergies  Allergen Reactions   Allegra Allergy [Fexofenadine Hcl]    Penicillins    Prednisone    Shellfish Allergy     Follow-up Information     Fernande Ophelia [name], MD Follow up.   Specialty: Internal Medicine Why: 1 week Contact information: [address] [facility] [place] [address] [phone]         [name], MD Follow up.   Specialty: Gastroenterology Why: call to schedule follow-up in about 1 month Contact information: [address] [place]  [place] [address] [phone]         Florencio Cara BIRCH, MD Follow up.    Specialties: Cardiology, Internal Medicine Contact information: [address] [place] [address] [phone]                  The results of significant diagnostics from this hospitalization (including imaging, microbiology, ancillary and laboratory) are listed below for reference.    Significant Diagnostic Studies: CT HEAD WO CONTRAST ( ) Result Date: 07/09/2024 CLINICAL DATA:  Benign essential hypertension.  Memory changes. EXAM: CT HEAD WITHOUT CONTRAST TECHNIQUE: Contiguous axial images were obtained from the base of the skull through the vertex without intravenous contrast. RADIATION DOSE REDUCTION: This exam was performed according to the departmental dose-optimization program which includes automated exposure control, adjustment of the  mA and/or kV according to patient size and/or use of iterative reconstruction technique. COMPARISON:  08/18/2023.  03/25/2009. FINDINGS: Brain: Age related volume loss, only mildly progressive since the study of 2010, and less than often seen at this age. No evidence of old or acute focal infarction, mass lesion, hemorrhage, hydrocephalus or extra-axial collection. Vascular: Ordinary age related atherosclerotic calcification of the major vessels at the base of the brain. Skull: Normal Sinuses/Orbits: Clear/normal Other: None IMPRESSION: No acute or reversible finding. Age related volume loss, only mildly progressive since the study of 2010, and less than often seen at this age. Electronically Signed   By: Oneil Officer M.D.   On: 07/09/2024 11:29   DG Chest 2 View Result Date: 07/06/2024 CLINICAL DATA:  CP EXAM: PORTABLE CHEST 1 VIEW COMPARISON:  CT chest, 08/19/2024. FINDINGS: Support lines; LEFT chest, subclavian-approach pacer with intact single lead. Borderline cardiac enlargement. Aortic arch atherosclerosis. Coronary bypass. Lungs are well inflated. No focal consolidation or mass. No pleural effusion or pneumothorax. Median  sternotomy change. No acute displaced fracture. IMPRESSION: 1. Mild cardiomegaly without acute superimposed cardiopulmonary process 2.  Aortic Atherosclerosis (ICD10-I70.0). Electronically Signed   By: Thom Hall M.D.   On: 07/06/2024 11:56    Microbiology: No results found for this or any previous visit (from the past 240 hours).   Labs: Basic Metabolic Panel: Recent Labs  Lab 07/06/24 1056 07/07/24 0051 07/08/24 0405 07/09/24 0331 07/10/24 0522  NA 137 139 140 138 136  K 4.0 4.1 5.1 4.8 4.9  CL 102 104 105 105 106  CO2 24 25 25 25 24   GLUCOSE 132* 104* 127* 113* 93  BUN 53* 54* 49* 34* 28*  CREATININE 1.49* 1.45* 1.39* 1.27* 1.04  CALCIUM  8.8* 8.6* 8.6* 8.2* 8.0*   Liver Function Tests: Recent Labs  Lab 07/07/24 1610  AST 30  ALT 13  ALKPHOS 45  BILITOT 1.7*  PROT 6.4*  ALBUMIN 3.0*   No results for input(s): LIPASE, AMYLASE in the last 168 hours. No results for input(s): AMMONIA in the last 168 hours. CBC: Recent Labs  Lab 07/07/24 0207 07/07/24 1451 07/08/24 0405 07/09/24 0331 07/10/24 0522 07/11/24 0428  WBC 6.9  --  7.3  7.5 7.3 5.6 5.6  NEUTROABS  --   --  4.5  --   --   --   HGB 6.4* 7.9* 7.9*  7.8* 8.0* 7.7* 7.6*  HCT 20.5*  --  25.2*  25.4* 26.1* 24.2* 24.7*  MCV 87.2  --  89.0  89.4 89.4 89.6 88.8  PLT 201  --  218  210 217 202 215   Cardiac Enzymes: No results for input(s): CKTOTAL, CKMB, CKMBINDEX, TROPONINI in the last 168 hours. BNP: BNP (last 3 results) No results for input(s): BNP in the last 8760 hours.  ProBNP (last 3 results) No results for input(s): PROBNP in the last 8760 hours.  CBG: No results for input(s): GLUCAP in the last 168 hours.     Signed:  Devaughn KATHEE Ban MD.  [facility] 07/11/2024, 1:05 PM

## 2024-07-11 NOTE — Progress Notes (Signed)
 Physical Therapy Evaluation Patient Details Name: Bryan Singleton MRN: [id number] DOB: March 10, 1932 Today's Date: 07/11/2024  History of Present Illness  Pt is a 89 y/o M admitted on 07/06/24 after presenting with c/o exertional chest pain, DOE. Pt is being treated for symptomatic anemia 2/2 GI bleed. PMH: HTN, HLD complicated by CAD s/p CABG, SSS s/p pacemaker, TIA, COPD  Clinical Impression  Pt demonstrates improved activity tolerance this session. Pt able to perform written HEP provided by SPT after amb with nursing using RW. Pt demonstrates good understanding of HEP with focus on strength/endurance. Pt demonstrates poor eccentric control upon sitting at EOB and was educated about hand placement and technique for safety. Pt would benefit from continued skilled physical therapy to promote optimal level of function      If plan is discharge home, recommend the following: A little help with walking and/or transfers;A little help with bathing/dressing/bathroom;Assistance with cooking/housework;Assist for transportation;Help with stairs or ramp for entrance   Can travel by private vehicle        Equipment Recommendations None recommended by PT  Recommendations for Other Services       Functional Status Assessment       Precautions / Restrictions Precautions Precautions: Fall Recall of Precautions/Restrictions: Intact Restrictions Weight Bearing Restrictions Per Provider Order: No      Mobility  Bed Mobility Overal bed mobility: Modified Independent Bed Mobility: Sit to Supine       Sit to supine: Modified independent (Device/Increase time)   General bed mobility comments: Able to manage trunk and LEs to get back into bed. Able to re position self with increased effort    Transfers Overall transfer level: Needs assistance Equipment used: Rolling walker (2 wheels) Transfers: Sit to/from Stand Sit to Stand: Contact guard assist           General transfer comment:  Poor eccentric control when sitting. Pt educated about hand placement prior to sitting for safety.    Ambulation/Gait Ambulation/Gait assistance: Supervision Gait Distance (Feet): 6 Feet Assistive device: Rolling walker (2 wheels) Gait Pattern/deviations: Decreased step length - right, Decreased step length - left Gait velocity: decreased     General Gait Details: Pt received amb back into room with nursing using RW. Declined further amb with SPT d/t fatigue and increased SOB  Stairs            Wheelchair Mobility     Tilt Bed    Modified Rankin (Stroke Patients Only)       Balance Overall balance assessment: Needs assistance Sitting-balance support: Feet supported, No upper extremity supported Sitting balance-Leahy Scale: Good Sitting balance - Comments: Able to perform strengthening exercises at EOB with good trunk control.   Standing balance support: Bilateral upper extremity supported, During functional activity, Reliant on assistive device for balance Standing balance-Leahy Scale: Fair Standing balance comment: Reliant on AD for support/balance. Per previous notes, able to maintain balance using less restricive AD                             Pertinent Vitals/Pain Pain Assessment Pain Assessment: No/denies pain    Home Living                          Prior Function                       Extremity/Trunk Assessment  Communication   Communication Communication: Impaired Factors Affecting Communication: Hearing impaired    Cognition Arousal: Alert Behavior During Therapy: WFL for tasks assessed/performed   PT - Cognitive impairments: History of cognitive impairments                       PT - Cognition Comments: pleasant and agreeable to PT Following commands: Intact       Cueing Cueing Techniques: Verbal cues, Visual cues     General Comments      Exercises Other Exercises Other  Exercises: Written HEP given for strengthening: seated marches, LAQ, heel/toe rockers, supine hip abduction, modified cunches, shoulder flexion. Verbal cues and demonstration provided for technique.   Assessment/Plan    PT Assessment    PT Problem List         PT Treatment Interventions      PT Goals (Current goals can be found in the Care Plan section)  Acute Rehab PT Goals Patient Stated Goal: I want to go home PT Goal Formulation: With patient Time For Goal Achievement: 07/21/24 Potential to Achieve Goals: Good    Frequency Min 2X/week     Co-evaluation               AM-PAC PT 6 Clicks Mobility  Outcome Measure Help needed turning from your back to your side while in a flat bed without using bedrails?: None Help needed moving from lying on your back to sitting on the side of a flat bed without using bedrails?: None Help needed moving to and from a bed to a chair (including a wheelchair)?: A Little Help needed standing up from a chair using your arms (e.g., wheelchair or bedside chair)?: A Little Help needed to walk in hospital room?: A Little Help needed climbing 3-5 steps with a railing? : A Little 6 Click Score: 20    End of Session   Activity Tolerance: Patient tolerated treatment well Patient left: in bed;with call bell/phone within reach;with bed alarm set Nurse Communication: Mobility status PT Visit Diagnosis: Unsteadiness on feet (R26.81);Muscle weakness (generalized) (M62.81);Other abnormalities of gait and mobility (R26.89)    Time: 9048-8995 PT Time Calculation (min) (ACUTE ONLY): 13 min   Charges:                 Cleopha Indelicato, SPT   Sofiah Lyne 07/11/2024, 12:06 PM

## 2024-07-11 NOTE — TOC Transition Note (Signed)
 Transition of Care [facility]) - Discharge Note   Patient Details  Name: Bryan Singleton MRN: [id number] Date of Birth: 04-Jun-1932  [facility]) CM/SW Contact:  Lauraine [name], LCSW Phone Number: 07/11/2024, 1:27 PM   Clinical Narrative:  Patient has orders to discharge home today. Patient and wife declined home health but will reach out to the [facility] if they change their minds. No further concerns. CSW signing off.  Final next level of care: Home/Self Care Barriers to Discharge: Barriers Resolved   Patient Goals and CMS Choice            Discharge Placement                    Patient and family notified of of transfer: 07/11/24  Discharge Plan and Services Additional resources added to the After Visit Summary for                                       Social Drivers of Health (SDOH) Interventions SDOH Screenings   Food Insecurity: No Food Insecurity (07/08/2024)  Housing: Low Risk  (07/08/2024)  Transportation Needs: No Transportation Needs (07/08/2024)  Utilities: Not At Risk (07/08/2024)  Financial Resource Strain: Low Risk  (12/27/2023)   Received from [facility]  Social Connections: Patient Declined (07/08/2024)  Tobacco Use: Medium Risk (07/10/2024)     Readmission Risk Interventions     No data to display

## 2024-07-11 NOTE — TOC Progression Note (Signed)
 Transition of Care [facility]) - Progression Note    Patient Details  Name: Bryan Singleton MRN: [id number] Date of Birth: 04/01/1932  [facility]) CM/SW Contact  Lauraine [name], LCSW Phone Number: 07/11/2024, 10:09 AM  Clinical Narrative:   CSW called the [facility]. Patient does have a PCP through the [facility] but is 0% service connected. They recommended that patient get home health through his Medicare plan since auth through the [place] takes several days.  Expected Discharge Plan: Home w Home Health Services Barriers to Discharge: Continued Medical Work up               Expected Discharge Plan and Services       Living arrangements for the past 2 months: Single Family Home                                       Social Drivers of Health (SDOH) Interventions SDOH Screenings   Food Insecurity: No Food Insecurity (07/08/2024)  Housing: Low Risk  (07/08/2024)  Transportation Needs: No Transportation Needs (07/08/2024)  Utilities: Not At Risk (07/08/2024)  Financial Resource Strain: Low Risk  (12/27/2023)   Received from [facility]  Social Connections: Patient Declined (07/08/2024)  Tobacco Use: Medium Risk (07/10/2024)    Readmission Risk Interventions     No data to display

## 2024-08-23 ENCOUNTER — Other Ambulatory Visit: Payer: Self-pay | Admitting: Internal Medicine

## 2024-08-23 DIAGNOSIS — E876 Hypokalemia: Secondary | ICD-10-CM

## 2024-08-23 DIAGNOSIS — N1831 Chronic kidney disease, stage 3a: Secondary | ICD-10-CM

## 2024-08-29 ENCOUNTER — Ambulatory Visit
Admission: RE | Admit: 2024-08-29 | Discharge: 2024-08-29 | Disposition: A | Discharge status: Home / Self Care | Source: Ambulatory Visit | Attending: Internal Medicine | Admitting: Internal Medicine

## 2024-08-29 DIAGNOSIS — N1831 Chronic kidney disease, stage 3a: Secondary | ICD-10-CM | POA: Insufficient documentation

## 2024-08-29 DIAGNOSIS — E876 Hypokalemia: Secondary | ICD-10-CM | POA: Diagnosis present

## 2024-08-29 DIAGNOSIS — N179 Acute kidney failure, unspecified: Secondary | ICD-10-CM | POA: Diagnosis present

## 2024-09-07 ENCOUNTER — Ambulatory Visit: Admitting: Speech Pathology

## 2024-09-07 DIAGNOSIS — R41841 Cognitive communication deficit: Secondary | ICD-10-CM | POA: Diagnosis present

## 2024-09-07 DIAGNOSIS — R2681 Unsteadiness on feet: Secondary | ICD-10-CM | POA: Diagnosis present

## 2024-09-07 DIAGNOSIS — R262 Difficulty in walking, not elsewhere classified: Secondary | ICD-10-CM | POA: Insufficient documentation

## 2024-09-07 DIAGNOSIS — M6281 Muscle weakness (generalized): Secondary | ICD-10-CM | POA: Diagnosis present

## 2024-09-07 NOTE — Therapy (Signed)
 OUTPATIENT SPEECH LANGUAGE PATHOLOGY  COGNITION EVALUATION   Patient Name: Bryan Singleton MRN: [id number] DOB:1932-02-07, 89 y.o., male Today's Date: 09/07/2024  PCP: Ophelia Sage, MD REFERRING PROVIDER: Lauraine Rocks, PA   End of Session - 09/07/24 1600     Visit Number 1    Number of Visits 25    Date for Recertification  11/30/24    Authorization Type [employer]    Progress Note Due on Visit 10    SLP Start Time 1445    SLP Stop Time  1530    SLP Time Calculation (min) 45 min    Activity Tolerance Patient tolerated treatment well          Past Medical History:  Diagnosis Date   COPD (chronic obstructive pulmonary disease) (HCC)    Coronary artery disease    Hypertension    Presence of permanent cardiac pacemaker    Past Surgical History:  Procedure Laterality Date   COLONOSCOPY N/A 07/10/2024   Procedure: COLONOSCOPY;  Surgeon: [name], MD;  Location: [facility] ENDOSCOPY;  Service: Endoscopy;  Laterality: N/A;   ESOPHAGOGASTRODUODENOSCOPY N/A 07/10/2024   Procedure: EGD (ESOPHAGOGASTRODUODENOSCOPY);  Surgeon: [name], MD;  Location: [facility] ENDOSCOPY;  Service: Endoscopy;  Laterality: N/A;   GIVENS CAPSULE STUDY  07/10/2024   Procedure: IMAGING PROCEDURE, GI TRACT, INTRALUMINAL, VIA CAPSULE;  Surgeon: [name], MD;  Location: [facility] ENDOSCOPY;  Service: Endoscopy;;   HOT HEMOSTASIS  07/10/2024   Procedure: EGD, WITH ARGON PLASMA COAGULATION;  Surgeon: [name], MD;  Location: [facility] ENDOSCOPY;  Service: Endoscopy;;   PACEMAKER INSERTION N/A 06/04/2020   Procedure: GENERATOR Change out;  Surgeon: Ammon Blunt, MD;  Location: [facility] ORS;  Service: Cardiovascular;  Laterality: N/A;   POLYPECTOMY  07/10/2024   Procedure: POLYPECTOMY, INTESTINE;  Surgeon: [name], MD;  Location[facility] ENDOSCOPY;  Service: Endoscopy;;   Patient Active Problem List   Diagnosis Date Noted   Acute blood loss anemia 07/10/2024   Angiodysplasia of intestinal tract  07/10/2024   Polyp of ascending colon 07/10/2024   Gout 07/07/2024   Symptomatic anemia 07/06/2024   Paroxysmal atrial fibrillation (HCC) 07/06/2024   Fall 08/21/2023   Syncope and collapse 08/19/2023   AKI (acute kidney injury) 08/19/2023   Generalized weakness 08/19/2023   Long term (current) use of warfarin 08/19/2023   SSS (sick sinus syndrome) (HCC) 08/19/2023   Hx of CABG x 5 08/19/2023   Subtherapeutic international normalized ratio (INR) 08/19/2023   Presence of permanent cardiac pacemaker    Coronary artery disease s/p CABG x 5    Controlled type 2 diabetes mellitus with complication, without long-term current use of insulin  (HCC) 03/03/2021   Chronic systolic CHF (congestive heart failure), NYHA class 2 (HCC) 04/19/2017   History of TIAs 02/05/2016   Peripheral vascular disease 02/05/2016   Severe tricuspid valve insufficiency 05/01/2015   Benign essential hypertension 04/24/2015   Moderate mitral insufficiency 09/24/2014   COPD (chronic obstructive pulmonary disease) (HCC) 03/02/2014    ONSET DATE: date of referral 09/01/2024   REFERRING DIAG: R41.3 (ICD-10-CM) - Memory loss   THERAPY DIAG:  Cognitive communication deficit  Rationale for Evaluation and Treatment Rehabilitation  SUBJECTIVE:   SUBJECTIVE STATEMENT: Pt pleasant, enjoyed talking about the [place] Pt accompanied by: significant other  PERTINENT HISTORY and DIAGNOSTIC FINDINGS: Pt is a 89 year old male with gradual decline in short-term memory (per neurology note 08/31/2024 - Memory test score decreased from 28/30 to 24/30 over the past year), history of cerebral infarction  and transient ischemic attack, balance impairment with recent fall 3 weeks ago result in minor head inury (no loss of consciousness), chronic pain of lumbar spine.   CT Head 07/05/2024 Brain: Age related volume loss, only mildly progressive since the study of 2010, and less than often seen at this age. No evidence of old or acute  focal infarction, mass lesion, hemorrhage, hydrocephalus or extra-axial collection.   PAIN:  Are you having pain? No   FALLS: Has patient fallen in last 6 months?  Yes  LIVING ENVIRONMENT: Lives with: lives with their spouse Lives in: House/apartment  PLOF:  Level of assistance: Needed assistance with ADLs, Needed assistance with IADLS Employment: Retired   PATIENT GOALS   to improve cognitive function  OBJECTIVE:   COGNITIVE COMMUNICATION Overall cognitive status: Impaired Areas of impairment:  Memory: Impaired: Immediate Working Short term Clinical Research Associate function: Impaired: Comment: would benefit from further evaluation Functional Impairments: Pt and his wife state he can remember things that happened 20 years ago but not 10 minutes ago Pt's wife is responsive for keeping up with all appts, orientation information, driving, medication and money management.   AUDITORY COMPREHENSION  Overall auditory comprehension: Appears intact YES/NO questions: Appears intact Following directions: impacted by cognitive deficits Conversation: Moderately Complex Interfering components: working Research Scientist (life Sciences): repetition/stressing words  READING COMPREHENSION: needs further assessment  EXPRESSION: verbal  VERBAL EXPRESSION:   Overall verbal expression: Appears intact Level of generative/spontaneous verbalization: conversation Automatic speech: name: intact and social response: intact  Repetition: Appears intact Naming: Confrontation: 76-100% Pragmatics: Appears intact Non-verbal means of communication: N/A  WRITTEN EXPRESSION: Nees further assessment  ORAL MOTOR EXAMINATION Facial : WFL Lingual: WFL Velum: WFL Mandible: WFL Cough: WFL Voice: WFL  MOTOR SPEECH: Overall motor speech: Appears intact Phonation: normal Resonance: WFL Articulation: Appears intact Intelligibility: Intelligible Motor planning: Appears intact    PATIENT REPORTED  OUTCOME MEASURES (PROM): To be completed over the next 3 sessions   TODAY'S TREATMENT:  Skilled education provided on ST POC, ways to promote recall of information, have his daughter help him find a large print book on [employer] and begin attempting to read.    PATIENT EDUCATION: Education details: results of this assessment, ST POC Person educated: Patient and Spouse Education method: Explanation Education comprehension: needs further education   HOME EXERCISE PROGRAM:   As above   GOALS:  Goals reviewed with patient? Yes  SHORT TERM GOALS: Target date: 10 sessions  Patient will participate in reminiscing when provided a visual prompt (e.g. Personal picture/item, magazine photo, etc) and compensatory communication strategies with min support from clinician/family during 5/5 opportunities. Baseline: Goal status: INITIAL  2.   To identify emerging areas of strength/need, patient will participate in ongoing diagnostic treatment (Mini Addenbrooke's Cognitive Examination, paragraph recall, functional reading/writing) by 10th visit progress note  Baseline:  Goal status: INITIAL  3.  With Rare Min A, patient will use strategies to improve memory for important information with 75% (ie., white board, daily planner/calendar).   Baseline:  Goal status: INITIAL   LONG TERM GOALS: Target date: 11/30/2024  To improve carry-over and establish a home exercise program, the patient will complete recommended home exercises 5/7 days per week per patient log within 8 weeks of initial therapy session. Baseline:  Goal status: INITIAL  2.  With Mod I, patient will demonstrate understanding of functional cognitive activities by listing 2 activities for home maintenance program.  Baseline:  Goal status: INITIAL   ASSESSMENT:  CLINICAL IMPRESSION: Patient is  a 89 y.o. male who was seen today for a cognitive communication evaluation in the setting of gradual cognitive decline. Pt presents  with subjective deficits in memory and attention as well as potentially executive function.    OBJECTIVE IMPAIRMENTS include attention, memory, and executive functioning. These impairments are limiting patient from managing medications, managing appointments, managing finances, household responsibilities, and ADLs/IADLs. Factors affecting potential to achieve goals and functional outcome are ability to learn/carryover information. Patient will benefit from skilled SLP services to address above impairments and improve overall function.  REHAB POTENTIAL: Good  PLAN: SLP FREQUENCY: 1-2x/week  SLP DURATION: 12 weeks  PLANNED INTERVENTIONS: Environmental controls, Cognitive reorganization, Internal/external aids, Functional tasks, SLP instruction and feedback, Compensatory strategies, and Patient/family education   Vail Vuncannon B. Rubbie, M.S., CCC-SLP, Tree Surgeon Certified Brain Injury Specialist [facility]  [facility] Rehabilitation Services Office [phone] Ascom [phone] Fax [phone]

## 2024-09-12 ENCOUNTER — Ambulatory Visit: Admitting: Speech Pathology

## 2024-09-14 ENCOUNTER — Ambulatory Visit: Admitting: Speech Pathology

## 2024-09-15 ENCOUNTER — Ambulatory Visit: Admitting: Speech Pathology

## 2024-09-15 DIAGNOSIS — R41841 Cognitive communication deficit: Secondary | ICD-10-CM

## 2024-09-15 NOTE — Therapy (Signed)
 " OUTPATIENT SPEECH LANGUAGE PATHOLOGY  COGNITION TREATMENT NOTE   Patient Name: Bryan Singleton MRN: [id number] DOB:1932/07/31, 89 y.o., male Today's Date: 09/15/2024  PCP: Ophelia Sage, MD REFERRING PROVIDER: Lauraine Rocks, PA   End of Session - 09/15/24 1053     Visit Number 2    Number of Visits 25    Date for Recertification  11/30/24    Authorization Type [employer]    Authorization Time Period 09/07/2024 thru 12/06/2024    Authorization - Visit Number 2    Authorization - Number of Visits 24    Progress Note Due on Visit 10    SLP Start Time 0930    SLP Stop Time  1015    SLP Time Calculation (min) 45 min    Activity Tolerance Patient tolerated treatment well          Past Medical History:  Diagnosis Date   COPD (chronic obstructive pulmonary disease) (HCC)    Coronary artery disease    Hypertension    Presence of permanent cardiac pacemaker    Past Surgical History:  Procedure Laterality Date   COLONOSCOPY N/A 07/10/2024   Procedure: COLONOSCOPY;  Surgeon: [name], MD;  Location: [facility] ENDOSCOPY;  Service: Endoscopy;  Laterality: N/A;   ESOPHAGOGASTRODUODENOSCOPY N/A 07/10/2024   Procedure: EGD (ESOPHAGOGASTRODUODENOSCOPY);  Surgeon: [name], MD;  Location: [facility] ENDOSCOPY;  Service: Endoscopy;  Laterality: N/A;   GIVENS CAPSULE STUDY  07/10/2024   Procedure: IMAGING PROCEDURE, GI TRACT, INTRALUMINAL, VIA CAPSULE;  Surgeon: [name], MD;  Location: [facility] ENDOSCOPY;  Service: Endoscopy;;   HOT HEMOSTASIS  07/10/2024   Procedure: EGD, WITH ARGON PLASMA COAGULATION;  Surgeon: [name], MD;  Location: [facility] ENDOSCOPY;  Service: Endoscopy;;   PACEMAKER INSERTION N/A 06/04/2020   Procedure: GENERATOR Change out;  Surgeon: Ammon Blunt, MD;  Location: [facility] ORS;  Service: Cardiovascular;  Laterality: N/A;   POLYPECTOMY  07/10/2024   Procedure: POLYPECTOMY, INTESTINE;  Surgeon: [name], MD;  Location: [facility] ENDOSCOPY;  Service: Endoscopy;;    Patient Active Problem List   Diagnosis Date Noted   Acute blood loss anemia 07/10/2024   Angiodysplasia of intestinal tract 07/10/2024   Polyp of ascending colon 07/10/2024   Gout 07/07/2024   Symptomatic anemia 07/06/2024   Paroxysmal atrial fibrillation (HCC) 07/06/2024   Fall 08/21/2023   Syncope and collapse 08/19/2023   AKI (acute kidney injury) 08/19/2023   Generalized weakness 08/19/2023   Long term (current) use of warfarin 08/19/2023   SSS (sick sinus syndrome) (HCC) 08/19/2023   Hx of CABG x 5 08/19/2023   Subtherapeutic international normalized ratio (INR) 08/19/2023   Presence of permanent cardiac pacemaker    Coronary artery disease s/p CABG x 5    Controlled type 2 diabetes mellitus with complication, without long-term current use of insulin  (HCC) 03/03/2021   Chronic systolic CHF (congestive heart failure), NYHA class 2 (HCC) 04/19/2017   History of TIAs 02/05/2016   Peripheral vascular disease 02/05/2016   Severe tricuspid valve insufficiency 05/01/2015   Benign essential hypertension 04/24/2015   Moderate mitral insufficiency 09/24/2014   COPD (chronic obstructive pulmonary disease) (HCC) 03/02/2014    ONSET DATE: date of referral 09/01/2024   REFERRING DIAG: R41.3 (ICD-10-CM) - Memory loss   THERAPY DIAG:  Cognitive communication deficit  Rationale for Evaluation and Treatment Rehabilitation  SUBJECTIVE:   PERTINENT HISTORY and DIAGNOSTIC FINDINGS: Pt is a 89 year old male with gradual decline in short-term memory (per neurology note 08/31/2024 - Memory test score decreased  from 28/30 to 24/30 over the past year), history of cerebral infarction and transient ischemic attack, balance impairment with recent fall 3 weeks ago result in minor head inury (no loss of consciousness), chronic pain of lumbar spine.   CT Head 07/05/2024 Brain: Age related volume loss, only mildly progressive since the study of 2010, and less than often seen at this age. No  evidence of old or acute focal infarction, mass lesion, hemorrhage, hydrocephalus or extra-axial collection.   PAIN:  Are you having pain? No   FALLS: Has patient fallen in last 6 months?  Yes  LIVING ENVIRONMENT: Lives with: lives with their spouse Lives in: House/apartment  PLOF:  Level of assistance: Needed assistance with ADLs, Needed assistance with IADLS Employment: Retired   PATIENT GOALS   to improve cognitive function  SUBJECTIVE STATEMENT: Pt pleasant, enjoyed talking about the [place]  NAVY Pt accompanied by: significant other  OBJECTIVE:   TODAY'S TREATMENT:  Skilled treatment session targeted pt's cognitive communication goals. SLP facilitated session by providing the following interventions:  Education provided on receipt of PT order with PT evaluation scheduled.   Formal cognitive assessment completed. See below:  Addenbrooke's Cognitive Examination - ACE III The Addenbrooke's Cognitive Examination-III (ACE-III) is a brief cognitive test that assesses five cognitive domains. The total score is 100 with higher scores indicating better cognitive functioning. Cut off scores of 88 and 82 are recommended for suspicion of dementia (88 has sensitivity of 1.00 and specificity of 0.96, 82 has sensitivity of 0.93 and specificity of 1.00). American Version A  Attention 9/18  Memory 8/26  Fluency 8/14  Language 26/26  Visuospatial 15/16  TOTAL ACE- III Score 66/100     PATIENT REPORTED OUTCOME MEASURES (PROM): To be completed over the next 3 sessions   PATIENT EDUCATION: Education details: results of this assessment, ST POC Person educated: Patient and Spouse Education method: Explanation Education comprehension: needs further education   HOME EXERCISE PROGRAM:   As above - request that he have his hearing re-assessed   GOALS:  Goals reviewed with patient? Yes  SHORT TERM GOALS: Target date: 10 sessions  Patient will participate in reminiscing when  provided a visual prompt (e.g. Personal picture/item, magazine photo, etc) and compensatory communication strategies with min support from clinician/family during 5/5 opportunities. Baseline: Goal status: INITIAL  2.   To identify emerging areas of strength/need, patient will participate in ongoing diagnostic treatment (Mini Addenbrooke's Cognitive Examination, paragraph recall, functional reading/writing) by 10th visit progress note  Baseline:  Goal status: INITIAL  3.  With Rare Min A, patient will use strategies to improve memory for important information with 75% (ie., white board, daily planner/calendar).   Baseline:  Goal status: INITIAL   LONG TERM GOALS: Target date: 11/30/2024  To improve carry-over and establish a home exercise program, the patient will complete recommended home exercises 5/7 days per week per patient log within 8 weeks of initial therapy session. Baseline:  Goal status: INITIAL  2.  With Mod I, patient will demonstrate understanding of functional cognitive activities by listing 2 activities for home maintenance program.  Baseline:  Goal status: INITIAL   ASSESSMENT:  CLINICAL IMPRESSION: Patient is a 89 y.o. male who was seen today for a cognitive communication treatment session including formal assessment of cognitive communication abilities. 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 He also has deficits in attention that might be multifactorial as related to his  hearing - he intermittently requested repetition of verbal information and there were occasions when didn't appear to know that he was being spoken too. Education provided on hearing and it's impact on cognitive function.    OBJECTIVE IMPAIRMENTS include attention, memory, and executive functioning. These impairments are limiting patient from managing medications, managing appointments,  managing finances, household responsibilities, and ADLs/IADLs. Factors affecting potential to achieve goals and functional outcome are ability to learn/carryover information. Patient will benefit from skilled SLP services to address above impairments and improve overall function.  REHAB POTENTIAL: Good  PLAN: SLP FREQUENCY: 1-2x/week  SLP DURATION: 12 weeks  PLANNED INTERVENTIONS: Environmental controls, Cognitive reorganization, Internal/external aids, Functional tasks, SLP instruction and feedback, Compensatory strategies, and Patient/family education   Amairani Shuey B. Rubbie, M.S., CCC-SLP, CBIS Speech-Language Pathologist Certified Brain Injury Specialist [facility]  [facility] [phone] Ascom [phone] Fax [phone]  "

## 2024-09-19 ENCOUNTER — Ambulatory Visit

## 2024-09-19 ENCOUNTER — Ambulatory Visit: Admitting: Speech Pathology

## 2024-09-19 DIAGNOSIS — M6281 Muscle weakness (generalized): Secondary | ICD-10-CM

## 2024-09-19 DIAGNOSIS — R262 Difficulty in walking, not elsewhere classified: Secondary | ICD-10-CM

## 2024-09-19 DIAGNOSIS — R2681 Unsteadiness on feet: Secondary | ICD-10-CM

## 2024-09-19 DIAGNOSIS — R41841 Cognitive communication deficit: Secondary | ICD-10-CM

## 2024-09-19 NOTE — Therapy (Signed)
 " OUTPATIENT SPEECH LANGUAGE PATHOLOGY  COGNITION TREATMENT NOTE   Patient Name: Bryan Singleton MRN: [id number] DOB:May 22, 1932, 89 y.o., male Today's Date: 09/19/2024  PCP: Ophelia Sage, MD REFERRING PROVIDER: Lauraine Rocks, PA   End of Session - 09/19/24 1347     Visit Number 3    Date for Recertification  11/30/24    Authorization Type [employer]    Authorization Time Period 09/07/2024 thru 12/06/2024    Authorization - Visit Number 3    Authorization - Number of Visits 24    Progress Note Due on Visit 10    SLP Start Time 1320    SLP Stop Time  1400    SLP Time Calculation (min) 40 min    Activity Tolerance Patient tolerated treatment well          Past Medical History:  Diagnosis Date   COPD (chronic obstructive pulmonary disease) (HCC)    Coronary artery disease    Hypertension    Presence of permanent cardiac pacemaker    Past Surgical History:  Procedure Laterality Date   COLONOSCOPY N/A 07/10/2024   Procedure: COLONOSCOPY;  Surgeon: [name], MD;  Location: [facility] ENDOSCOPY;  Service: Endoscopy;  Laterality: N/A;   ESOPHAGOGASTRODUODENOSCOPY N/A 07/10/2024   Procedure: EGD (ESOPHAGOGASTRODUODENOSCOPY);  Surgeon: [name], MD;  Location: [facility] ENDOSCOPY;  Service: Endoscopy;  Laterality: N/A;   GIVENS CAPSULE STUDY  07/10/2024   Procedure: IMAGING PROCEDURE, GI TRACT, INTRALUMINAL, VIA CAPSULE;  Surgeon: [name], MD;  Location: [facility] ENDOSCOPY;  Service: Endoscopy;;   HOT HEMOSTASIS  07/10/2024   Procedure: EGD, WITH ARGON PLASMA COAGULATION;  Surgeon: [name], MD;  Location: [facility] ENDOSCOPY;  Service: Endoscopy;;   PACEMAKER INSERTION N/A 06/04/2020   Procedure: GENERATOR Change out;  Surgeon: Ammon Blunt, MD;  Location: [facility] ORS;  Service: Cardiovascular;  Laterality: N/A;   POLYPECTOMY  07/10/2024   Procedure: POLYPECTOMY, INTESTINE;  Surgeon: [name], MD;  Location: [facility] ENDOSCOPY;  Service: Endoscopy;;   Patient Active Problem  List   Diagnosis Date Noted   Acute blood loss anemia 07/10/2024   Angiodysplasia of intestinal tract 07/10/2024   Polyp of ascending colon 07/10/2024   Gout 07/07/2024   Symptomatic anemia 07/06/2024   Paroxysmal atrial fibrillation (HCC) 07/06/2024   Fall 08/21/2023   Syncope and collapse 08/19/2023   AKI (acute kidney injury) 08/19/2023   Generalized weakness 08/19/2023   Long term (current) use of warfarin 08/19/2023   SSS (sick sinus syndrome) (HCC) 08/19/2023   Hx of CABG x 5 08/19/2023   Subtherapeutic international normalized ratio (INR) 08/19/2023   Presence of permanent cardiac pacemaker    Coronary artery disease s/p CABG x 5    Controlled type 2 diabetes mellitus with complication, without long-term current use of insulin  (HCC) 03/03/2021   Chronic systolic CHF (congestive heart failure), NYHA class 2 (HCC) 04/19/2017   History of TIAs 02/05/2016   Peripheral vascular disease 02/05/2016   Severe tricuspid valve insufficiency 05/01/2015   Benign essential hypertension 04/24/2015   Moderate mitral insufficiency 09/24/2014   COPD (chronic obstructive pulmonary disease) (HCC) 03/02/2014    ONSET DATE: date of referral 09/01/2024   REFERRING DIAG: R41.3 (ICD-10-CM) - Memory loss   THERAPY DIAG:  Cognitive communication deficit  Rationale for Evaluation and Treatment Rehabilitation  SUBJECTIVE:   PERTINENT HISTORY and DIAGNOSTIC FINDINGS: Pt is a 89 year old male with gradual decline in short-term memory (per neurology note 08/31/2024 - Memory test score decreased from 28/30 to 24/30 over the past  year), history of cerebral infarction and transient ischemic attack, balance impairment with recent fall 3 weeks ago result in minor head inury (no loss of consciousness), chronic pain of lumbar spine.   CT Head 07/05/2024 Brain: Age related volume loss, only mildly progressive since the study of 2010, and less than often seen at this age. No evidence of old or acute focal  infarction, mass lesion, hemorrhage, hydrocephalus or extra-axial collection.   PAIN:  Are you having pain? No   FALLS: Has patient fallen in last 6 months?  Yes  LIVING ENVIRONMENT: Lives with: lives with their spouse Lives in: House/apartment  PLOF:  Level of assistance: Needed assistance with ADLs, Needed assistance with IADLS Employment: Retired   PATIENT GOALS   to improve cognitive function  SUBJECTIVE STATEMENT: Pt pleasant, enjoyed talking about the [place]  [place]VY Pt accompanied by: significant other  OBJECTIVE:   TODAY'S TREATMENT:  Skilled treatment session targeted pt's cognitive communication goals. SLP facilitated session by providing the following interventions:  SLP provided verbal and written strategies to increase cognitive function during the day. Strategies targeting sleep hygiene, nutrition and active participation in various tasks throughout the day were facilitated. Currently pt wakes up during the night, consumes an Ensure drink, returns to bed and sleeps until 9am, watches TV and particpates in very few social (out of the house activities). Education provided on discontinuing PO consumption during the night as well as establishing an earlier rise and sleep time. List of potential activities were provided to complete throughout the day. Pt observed with perseverative comments such as completing a model boat, attempting to play the fiddle etc. Pt also states that he is not a social person.    PATIENT EDUCATION: Education details: see above Person educated: Patient and Spouse Education method: Explanation Education comprehension: needs further education   HOME EXERCISE PROGRAM:   As above - request that he have his hearing re-assessed Create schedule to target task completion throughout the day, everyday  GOALS:  Goals reviewed with patient? Yes  SHORT TERM GOALS: Target date: 10 sessions  Patient will participate in reminiscing when provided a visual  prompt (e.g. Personal picture/item, magazine photo, etc) and compensatory communication strategies with min support from clinician/family during 5/5 opportunities. Baseline: Goal status: INITIAL  2.   To identify emerging areas of strength/need, patient will participate in ongoing diagnostic treatment (Mini Addenbrooke's Cognitive Examination, paragraph recall, functional reading/writing) by 10th visit progress note  Baseline:  Goal status: INITIAL  3.  With Rare Min A, patient will use strategies to improve memory for important information with 75% (ie., white board, daily planner/calendar).   Baseline:  Goal status: INITIAL   LONG TERM GOALS: Target date: 11/30/2024  To improve carry-over and establish a home exercise program, the patient will complete recommended home exercises 5/7 days per week per patient log within 8 weeks of initial therapy session. Baseline:  Goal status: INITIAL  2.  With Mod I, patient will demonstrate understanding of functional cognitive activities by listing 2 activities for home maintenance program.  Baseline:  Goal status: INITIAL   ASSESSMENT:  CLINICAL IMPRESSION: Patient is a 89 y.o. male who was seen today for a cognitive communication treatment session including formal assessment of cognitive communication abilities. 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 He also has deficits in attention that might be multifactorial as related to his hearing - he intermittently requested repetition of verbal information  and there were occasions when didn't appear to know that he was being spoken too. Education provided on hearing and it's impact on cognitive function.    Pt with intermittent responsiveness to the above mentioned strategies to promote cognitive function. See the above treatment note for details.   OBJECTIVE IMPAIRMENTS include  attention, memory, and executive functioning. These impairments are limiting patient from managing medications, managing appointments, managing finances, household responsibilities, and ADLs/IADLs. Factors affecting potential to achieve goals and functional outcome are ability to learn/carryover information. Patient will benefit from skilled SLP services to address above impairments and improve overall function.  REHAB POTENTIAL: Good  PLAN: SLP FREQUENCY: 1-2x/week  SLP DURATION: 12 weeks  PLANNED INTERVENTIONS: Environmental controls, Cognitive reorganization, Internal/external aids, Functional tasks, SLP instruction and feedback, Compensatory strategies, and Patient/family education   Zelig Gacek B. Rubbie, M.S., CCC-SLP, CBIS Speech-Language Pathologist Certified Brain Injury Specialist [facility]  [facility] [phone] Ascom [phone] Fax [phone]  "

## 2024-09-19 NOTE — Therapy (Signed)
 " OUTPATIENT PHYSICAL THERAPY NEURO EVALUATION   Patient Name: Bryan Singleton MRN: [id number] DOB:10-23-1931, 89 y.o., male Today's Date: 09/19/2024   PCP: Fernande Ophelia [name], MD REFERRING PROVIDER: Fernande Ophelia [name], MD  END OF SESSION:  PT End of Session - 09/19/24 1325     Visit Number 1    Number of Visits 24    Date for Recertification  12/12/24    PT Start Time 1400    PT Stop Time 1441    PT Time Calculation (min) 41 min    Activity Tolerance Patient tolerated treatment well    Behavior During Therapy [facility] for tasks assessed/performed          Past Medical History:  Diagnosis Date   COPD (chronic obstructive pulmonary disease) (HCC)    Coronary artery disease    Hypertension    Presence of permanent cardiac pacemaker    Past Surgical History:  Procedure Laterality Date   COLONOSCOPY N/A 07/10/2024   Procedure: COLONOSCOPY;  Surgeon: [name], MD;  Location: [facility] ENDOSCOPY;  Service: Endoscopy;  Laterality: N/A;   ESOPHAGOGASTRODUODENOSCOPY N/A 07/10/2024   Procedure: EGD (ESOPHAGOGASTRODUODENOSCOPY);  Surgeon: [name], MD;  Location: [facility] ENDOSCOPY;  Service: Endoscopy;  Laterality: N/A;   GIVENS CAPSULE STUDY  07/10/2024   Procedure: IMAGING PROCEDURE, GI TRACT, INTRALUMINAL, VIA CAPSULE;  Surgeon: [name], MD;  Location: [facility] ENDOSCOPY;  Service: Endoscopy;;   HOT HEMOSTASIS  07/10/2024   Procedure: EGD, WITH ARGON PLASMA COAGULATION;  Surgeon: [name], MD;  Location: [facility] ENDOSCOPY;  Service: Endoscopy;;   PACEMAKER INSERTION N/A 06/04/2020   Procedure: GENERATOR Change out;  Surgeon: Ammon Blunt, MD;  Location: [facility] ORS;  Service: Cardiovascular;  Laterality: N/A;   POLYPECTOMY  07/10/2024   Procedure: POLYPECTOMY, INTESTINE;  Surgeon: [name], MD;  Location: [facility] ENDOSCOPY;  Service: Endoscopy;;   Patient Active Problem List   Diagnosis Date Noted   Acute blood loss anemia 07/10/2024   Angiodysplasia of intestinal tract  07/10/2024   Polyp of ascending colon 07/10/2024   Gout 07/07/2024   Symptomatic anemia 07/06/2024   Paroxysmal atrial fibrillation (HCC) 07/06/2024   Fall 08/21/2023   Syncope and collapse 08/19/2023   AKI (acute kidney injury) 08/19/2023   Generalized weakness 08/19/2023   Long term (current) use of warfarin 08/19/2023   SSS (sick sinus syndrome) (HCC) 08/19/2023   Hx of CABG x 5 08/19/2023   Subtherapeutic international normalized ratio (INR) 08/19/2023   Presence of permanent cardiac pacemaker    Coronary artery disease s/p CABG x 5    Controlled type 2 diabetes mellitus with complication, without long-term current use of insulin  (HCC) 03/03/2021   Chronic systolic CHF (congestive heart failure), NYHA class 2 (HCC) 04/19/2017   History of TIAs 02/05/2016   Peripheral vascular disease 02/05/2016   Severe tricuspid valve insufficiency 05/01/2015   Benign essential hypertension 04/24/2015   Moderate mitral insufficiency 09/24/2014   COPD (chronic obstructive pulmonary disease) (HCC) 03/02/2014    ONSET DATE: chronic  REFERRING DIAG: R26.89 (ICD-10-CM) - Imbalance  THERAPY DIAG:  Unsteadiness on feet  Muscle weakness (generalized)  Difficulty in walking, not elsewhere classified  Rationale for Evaluation and Treatment: Rehabilitation  SUBJECTIVE:  SUBJECTIVE STATEMENT: Patient and wife reports he had a stroke 1 year ago and he states his left leg doesn't know what his right leg is doing. He tends to drag his right leg while he's walking. He reports he fell 6 weeks ago in the kitchen when he was squatting down to get something out of the lower cabinet. He furniture walks within the home but uses cane for outdoor mobility with standby assistance from wife.   Pt accompanied by: significant  other  PERTINENT HISTORY:   Per Dr. [name] note from 08/14/24, they attempt to walk but find it difficult due to having to 'drag one of my legs'. They can walk about fifty yards with the help of a neighbor's porch handle, with variability influenced by weather conditions.   PAIN:  Are you having pain? No  PRECAUTIONS: Fall  RED FLAGS: None   WEIGHT BEARING RESTRICTIONS: No  FALLS: Has patient fallen in last 6 months? Yes. Number of falls 1  LIVING ENVIRONMENT: Lives with: lives with their spouse Lives in: House/apartment Stairs: Yes: External: 3 steps; bilateral but cannot reach both Has following equipment at home: Single point cane and Danh Bayus - 2 wheeled  PLOF: Independent with use of cane  PATIENT GOALS: to get stronger and more independent  OBJECTIVE:  Note: Objective measures were completed at Evaluation unless otherwise noted.  DIAGNOSTIC FINDINGS: n/a  COGNITION: Overall cognitive status: History of cognitive impairments - at baseline - progressive memory loss    SENSATION: Hx of peripheral neuropathy   COORDINATION: Not tested   POSTURE: rounded shoulders and forward head  LOWER EXTREMITY ROM:     Active  Right Eval Left Eval  Hip flexion    Hip extension    Hip abduction    Hip adduction    Hip internal rotation    Hip external rotation    Knee flexion    Knee extension    Ankle dorsiflexion    Ankle plantarflexion    Ankle inversion    Ankle eversion     (Blank rows = not tested)  LOWER EXTREMITY MMT:    MMT Right Eval Left Eval  Hip flexion 4- 4-  Hip extension    Hip abduction 4 4  Hip adduction 4 4  Hip internal rotation    Hip external rotation    Knee flexion 4+ 4+  Knee extension 4+ 4+  Ankle dorsiflexion 4- 4-  Ankle plantarflexion    Ankle inversion    Ankle eversion    (Blank rows = not tested)  BED MOBILITY:  Not tested  TRANSFERS: Sit to stand: CGA  Assistive device utilized: BUE pushing from seat       RAMP:  Not tested  CURB:  Not tested  STAIRS: Not tested GAIT: Findings: Gait Characteristics: decreased stride length and poor foot clearance- Right, Distance walked: 150', Assistive device utilized:Single point cane, Level of assistance: SBA, and Comments: Patient feels that he is dragging R foot, however no noticeable drag this date (may worsen with fatigue)  FUNCTIONAL TESTS:  5 times sit to stand: 48.55 seconds with BUE support on chair (unable to complete with no UE support) 10 meter walk test: 0.41 m/s with use of SPC  TUG: 23.35 seconds with use of SPC  6 minute walk test: to be completed visit #2  Berg Balance Scale: to be completed visit #2   PATIENT SURVEYS:  ABC scale: The Activities-Specific Balance Confidence (ABC) Scale 0% 10 20 30  40 50 60  70 80 90 100% No confidence<->completely confident  How confident are you that you will not lose your balance or become unsteady when you . . .   Date tested 09/19/24  Walk around the house 80%  2. Walk up or down stairs 10%  3. Bend over and pick up a slipper from in front of a closet floor 0%  4. Reach for a small can off a shelf at eye level 100%  5. Stand on tip toes and reach for something above your head 0%  6. Stand on a chair and reach for something 0%  7. Sweep the floor 0%  8. Walk outside the house to a car parked in the driveway 70%  9. Get into or out of a car 100%  10. Walk across a parking lot to the mall 10%  11. Walk up or down a ramp 0%  12. Walk in a crowded mall where people rapidly walk past you 0%  13. Are bumped into by people as you walk through the mall 0%  14. Step onto or off of an escalator while you are holding onto the railing 10%  15. Step onto or off an escalator while holding onto parcels such that you cannot hold onto the railing 0%  16. Walk outside on icy sidewalks 0%  Total: #/16 23.75%                                                                                                                                  TREATMENT DATE: 09/19/2024    PATIENT EDUCATION: Education details:  POC, goals  Person educated: Patient Education method: Programmer, Multimedia, Demonstration, and Handouts Education comprehension: verbalized understanding and returned demonstration  HOME EXERCISE PROGRAM: To be completed visit #2   GOALS: Goals reviewed with patient? Yes  SHORT TERM GOALS: Target date: 10/31/2024  Patient will be independent in HEP to improve strength/mobility for better functional independence with ADLs.  Baseline: Goal status: INITIAL   LONG TERM GOALS: Target date: 12/12/2024  Patient will increase ABC scale score >80% to demonstrate better functional mobility and better confidence with ADLs.   Baseline: 09/19/24: 23.75%  Goal status: INITIAL  2.  Patient (> 50 years old) will complete five times sit to stand test in < 15 seconds indicating an increased LE strength and improved balance.  Baseline: 09/19/24: 48.55 seconds with BUE support   Goal status: INITIAL  3.  Patient will improve by 34m (164') in order to demonstrate clinically significant improvement in cardiopulmonary endurance and community ambulation  Baseline: 09/19/24: to be completed visit #2   Goal status: INITIAL  4.  Patient will increase Berg Balance score by > 6 points to demonstrate decreased fall risk during functional activities. Baseline: 09/19/24: to be completed visit #2   Goal status: INITIAL  5.  Patient will increase BLE gross strength to 4+/5 as to improve functional strength for independent gait, increased standing  tolerance and increased ADL ability.  Baseline: 09/19/24: see chart above   Goal status: INITIAL  6.  Patient will reduce timed up and go to <11 seconds to reduce fall risk and demonstrate improved transfer/gait ability.  Baseline: 09/19/24: 23.35 seconds with use of SPC   Goal status: INITIAL  7. Patient will increase 10 meter walk test to >1.21m/s as to improve  gait speed for better community ambulation and to reduce fall risk.   Baseline: 09/19/24: 0.41 m/s   Goal status: INITIAL   ASSESSMENT:  CLINICAL IMPRESSION: Patient is a 89 y.o. male who was seen today for physical therapy evaluation and treatment for balance. Patient presents with BLE weakness, impaired balance, difficulty walking, and decreased activity tolerance. Patient with high risk for falls based on gait speed, TUG, and 5xSTS. Patient demonstrates decreased foot clearance on R and decreased stride length. Patient with decreased confidence in balance with reflection of 23.75% on ABC scale. Patient will benefit from skilled PT services to address listed impairments in order to improve quality of life, improve confidence, and improve overall balance and mobility.   OBJECTIVE IMPAIRMENTS: Abnormal gait, cardiopulmonary status limiting activity, decreased activity tolerance, decreased balance, decreased endurance, difficulty walking, and decreased strength.   ACTIVITY LIMITATIONS: bending, squatting, stairs, and locomotion level  PARTICIPATION LIMITATIONS: cleaning, laundry, community activity, and yard work  PERSONAL FACTORS: Age, Behavior pattern, Past/current experiences, and Time since onset of injury/illness/exacerbation are also affecting patient's functional outcome.   REHAB POTENTIAL: Good  CLINICAL DECISION MAKING: Evolving/moderate complexity  EVALUATION COMPLEXITY: Moderate  PLAN:  PT FREQUENCY: 1-2x/week  PT DURATION: 12 weeks  PLANNED INTERVENTIONS: [id number]- PT Re-evaluation, 97750- Physical Performance Testing, 97110-Therapeutic exercises, 97530- Therapeutic activity, 97112- Neuromuscular re-education, 97535- Self Care, [id number]- Manual therapy, [phone]- Gait training, Patient/Family education, Balance training, Stair training, Joint mobilization, Joint manipulation, DME instructions, Cryotherapy, and Moist heat  PLAN FOR NEXT SESSION: Initiate HEP, , BERG,  dynamic/static balance training    Maryanne Finder, PT, DPT Physical Therapist - [facility] 09/19/2024, 1:26 PM       "

## 2024-09-25 ENCOUNTER — Ambulatory Visit

## 2024-09-25 ENCOUNTER — Ambulatory Visit: Admitting: Speech Pathology

## 2024-09-25 DIAGNOSIS — R41841 Cognitive communication deficit: Secondary | ICD-10-CM

## 2024-09-25 DIAGNOSIS — R262 Difficulty in walking, not elsewhere classified: Secondary | ICD-10-CM

## 2024-09-25 DIAGNOSIS — M6281 Muscle weakness (generalized): Secondary | ICD-10-CM

## 2024-09-25 DIAGNOSIS — R2681 Unsteadiness on feet: Secondary | ICD-10-CM

## 2024-09-25 NOTE — Therapy (Signed)
 " OUTPATIENT PHYSICAL THERAPY NEURO TREATMENT   Patient Name: Bryan Singleton MRN: [id number] DOB:[birth year], 89 y.o., male Today's Date: 09/25/2024   PCP: Fernande Ophelia [name], MD REFERRING PROVIDER: Fernande Ophelia [name], MD  END OF SESSION:  PT End of Session - 09/25/24 0931     Visit Number 2    Number of Visits 24    Date for Recertification  12/12/24    PT Start Time 0931    PT Stop Time 1015    PT Time Calculation (min) 44 min    Activity Tolerance Patient tolerated treatment well    Behavior During Therapy [facility] for tasks assessed/performed           Past Medical History:  Diagnosis Date   COPD (chronic obstructive pulmonary disease) (HCC)    Coronary artery disease    Hypertension    Presence of permanent cardiac pacemaker    Past Surgical History:  Procedure Laterality Date   COLONOSCOPY N/A 07/10/2024   Procedure: COLONOSCOPY;  Surgeon: [name], MD;  Location: [facility] ENDOSCOPY;  Service: Endoscopy;  Laterality: N/A;   ESOPHAGOGASTRODUODENOSCOPY N/A 07/10/2024   Procedure: EGD (ESOPHAGOGASTRODUODENOSCOPY);  Surgeon: [name], MD;  Location: [facility] ENDOSCOPY;  Service: Endoscopy;  Laterality: N/A;   GIVENS CAPSULE STUDY  07/10/2024   Procedure: IMAGING PROCEDURE, GI TRACT, INTRALUMINAL, VIA CAPSULE;  Surgeon: [name], MD;  Location: [facility] ENDOSCOPY;  Service: Endoscopy;;   HOT HEMOSTASIS  07/10/2024   Procedure: EGD, WITH ARGON PLASMA COAGULATION;  Surgeon: [name], MD;  Location: [facility] ENDOSCOPY;  Service: Endoscopy;;   PACEMAKER INSERTION N/A 06/04/2020   Procedure: GENERATOR Change out;  Surgeon: Ammon Blunt, MD;  Location: [facility] ORS;  Service: Cardiovascular;  Laterality: N/A;   POLYPECTOMY  07/10/2024   Procedure: POLYPECTOMY, INTESTINE;  Surgeon: [name], MD;  Location: [facility] ENDOSCOPY;  Service: Endoscopy;;   Patient Active Problem List   Diagnosis Date Noted   Acute blood loss anemia 07/10/2024   Angiodysplasia of intestinal tract  07/10/2024   Polyp of ascending colon 07/10/2024   Gout 07/07/2024   Symptomatic anemia 07/06/2024   Paroxysmal atrial fibrillation (HCC) 07/06/2024   Fall 08/21/2023   Syncope and collapse 08/19/2023   AKI (acute kidney injury) 08/19/2023   Generalized weakness 08/19/2023   Long term (current) use of warfarin 08/19/2023   SSS (sick sinus syndrome) (HCC) 08/19/2023   Hx of CABG x 5 08/19/2023   Subtherapeutic international normalized ratio (INR) 08/19/2023   Presence of permanent cardiac pacemaker    Coronary artery disease s/p CABG x 5    Controlled type 2 diabetes mellitus with complication, without long-term current use of insulin  (HCC) 03/03/2021   Chronic systolic CHF (congestive heart failure), NYHA class 2 (HCC) 04/19/2017   History of TIAs 02/05/2016   Peripheral vascular disease 02/05/2016   Severe tricuspid valve insufficiency 05/01/2015   Benign essential hypertension 04/24/2015   Moderate mitral insufficiency 09/24/2014   COPD (chronic obstructive pulmonary disease) (HCC) 03/02/2014    ONSET DATE: chronic  REFERRING DIAG: R26.89 (ICD-10-CM) - Imbalance  THERAPY DIAG:  Unsteadiness on feet  Muscle weakness (generalized)  Difficulty in walking, not elsewhere classified  Cognitive communication deficit  Rationale for Evaluation and Treatment: Rehabilitation  SUBJECTIVE:  SUBJECTIVE STATEMENT:  Pt reports no new complaints, and was able to complete some of his HEP over the weekend.  Pt notes he has also been walking up/down the driveway.     Pt accompanied by: significant other  PERTINENT HISTORY:   Per Dr. [name] note from 08/14/24, they attempt to walk but find it difficult due to having to 'drag one of my legs'. They can walk about fifty yards with the help of a  neighbor's porch handle, with variability influenced by weather conditions.   PAIN:  Are you having pain? No  PRECAUTIONS: Fall  RED FLAGS: None   WEIGHT BEARING RESTRICTIONS: No  FALLS: Has patient fallen in last 6 months? Yes. Number of falls 1  LIVING ENVIRONMENT: Lives with: lives with their spouse Lives in: House/apartment Stairs: Yes: External: 3 steps; bilateral but cannot reach both Has following equipment at home: Single point cane and Walker - 2 wheeled  PLOF: Independent with use of cane  PATIENT GOALS: to get stronger and more independent  OBJECTIVE:  Note: Objective measures were completed at Evaluation unless otherwise noted.  DIAGNOSTIC FINDINGS: n/a  COGNITION: Overall cognitive status: History of cognitive impairments - at baseline - progressive memory loss    SENSATION: Hx of peripheral neuropathy   COORDINATION: Not tested   POSTURE: rounded shoulders and forward head  LOWER EXTREMITY ROM:     Active  Right Eval Left Eval  Hip flexion    Hip extension    Hip abduction    Hip adduction    Hip internal rotation    Hip external rotation    Knee flexion    Knee extension    Ankle dorsiflexion    Ankle plantarflexion    Ankle inversion    Ankle eversion     (Blank rows = not tested)  LOWER EXTREMITY MMT:    MMT Right Eval Left Eval  Hip flexion 4- 4-  Hip extension    Hip abduction 4 4  Hip adduction 4 4  Hip internal rotation    Hip external rotation    Knee flexion 4+ 4+  Knee extension 4+ 4+  Ankle dorsiflexion 4- 4-  Ankle plantarflexion    Ankle inversion    Ankle eversion    (Blank rows = not tested)  BED MOBILITY:  Not tested  TRANSFERS: Sit to stand: CGA  Assistive device utilized: BUE pushing from seat      RAMP:  Not tested  CURB:  Not tested  STAIRS: Not tested GAIT: Findings: Gait Characteristics: decreased stride length and poor foot clearance- Right, Distance walked: 150', Assistive device  utilized:Single point cane, Level of assistance: SBA, and Comments: Patient feels that he is dragging R foot, however no noticeable drag this date (may worsen with fatigue)  FUNCTIONAL TESTS:  5 times sit to stand: 48.55 seconds with BUE support on chair (unable to complete with no UE support) 10 meter walk test: 0.41 m/s with use of SPC  TUG: 23.35 seconds with use of SPC  6 minute walk test: 300' with SPC, multiple standing rest breaks Berg Balance Scale: 27/56  PATIENT SURVEYS:  ABC scale: The Activities-Specific Balance Confidence (ABC) Scale 0% 10 20 30  40 50 60 70 80 90 100% No confidence<->completely confident  How confident are you that you will not lose your balance or become unsteady when you . . .   Date tested 09/19/24  Walk around the house 80%  2. Walk up or down stairs 10%  3.  Bend over and pick up a slipper from in front of a closet floor 0%  4. Reach for a small can off a shelf at eye level 100%  5. Stand on tip toes and reach for something above your head 0%  6. Stand on a chair and reach for something 0%  7. Sweep the floor 0%  8. Walk outside the house to a car parked in the driveway 70%  9. Get into or out of a car 100%  10. Walk across a parking lot to the mall 10%  11. Walk up or down a ramp 0%  12. Walk in a crowded mall where people rapidly walk past you 0%  13. Are bumped into by people as you walk through the mall 0%  14. Step onto or off of an escalator while you are holding onto the railing 10%  15. Step onto or off an escalator while holding onto parcels such that you cannot hold onto the railing 0%  16. Walk outside on icy sidewalks 0%  Total: #/16 23.75%                                                                                                                                 TREATMENT DATE: 09/25/2024    6 Min Walk Test:  Instructed patient to ambulate as quickly and as safely as possible for 6 minutes using LRAD. Patient was allowed to  take standing rest breaks without stopping the test, but if the patient required a sitting rest break the clock would be stopped and the test would be over.  Results: 300 feet using a SPC with CGA. Results indicate that the patient has reduced endurance with ambulation compared to age matched norms.  Age Matched Norms (in meters): 53-69 yo M: 39 F: 91, 24-79 yo M: 27 F: 471, 83-89 yo M: 417 F: 392 MDC: 58.21 meters (190.98 feet) or 50 meters (ANPTA Core Set of Outcome Measures for Adults with Neurologic Conditions, 2018)  Pt noted to be experiencing some chest pain that he reported so vitals were assessed:  BP: 127/85 mmHg HR: 73 bpm  Pt encouraged and educated along with spouse about the importance of telling PCP about chest pain with exertional activity   Patient demonstrates increased fall risk as noted by score of  27/56 on Berg Balance Scale.  (<36= high risk for falls, close to 100%; 37-45 significant >80%; 46-51 moderate >50%; 52-55 lower >25%)  Item Test date: 09/25/2024  Sitting to standing 3. able to stand independently using hands  2. Standing unsupported 3. able to stand 2 minutes with supervision  3. Sitting with back unsupported, feet supported 4. able to sit safely and securely for 2 minutes  4. Standing to sitting 3. controls descent by using hands  5. Pivot transfer  3. able to transfer safely with definite need of hands  6. Standing unsupported with eyes closed 2. able to stand  3 seconds  7. Standing unsupported with feet together 1. needs help to attain position but able to stand 15 seconds feet together  8. Reaching forward with outstretched arms while standing 3. can reach forward 12 cm (5 inches)  9. Pick up object from the floor from standing 1. unable to pick up and needs supervision while trying  10. Turning to look behind over left and right shoulders while standing 3. looks behind one side only, other side shows less weight shift  11. Turn 360 degrees 1. needs  close supervision or verbal cuing  12. Place alternate foot on step or stool while standing unsupported 0. needs assistance to keep from falling/unable to try  13. Standing unsupported one foot in front 0. loses balance while stepping or standing  14. Standing on one leg 0. unable to try of needs assist to prevent fall    Total Score 27/56    Generated HEP and noted below   PATIENT EDUCATION: Education details:  POC, goals  Person educated: Patient Education method: Explanation, Demonstration, and Handouts Education comprehension: verbalized understanding and returned demonstration  HOME EXERCISE PROGRAM: Access Code: [email or web address] URL: [email or web address] Date: 09/25/2024 Prepared by: Sidra Simpers  Exercises - Standing Hip Abduction with Counter Support  - 1 x daily - 7 x weekly - 3 sets - 10 reps - Standing March with Counter Support  - 1 x daily - 7 x weekly - 3 sets - 10 reps - Standing Hip Extension with Counter Support  - 1 x daily - 7 x weekly - 3 sets - 10 reps - Standing 4-Way Leg Reach with Counter Support  - 1 x daily - 7 x weekly - 3 sets - 10 reps - Standing Knee Flexion with Counter Support  - 1 x daily - 7 x weekly - 3 sets - 10 reps  GOALS: Goals reviewed with patient? Yes  SHORT TERM GOALS: Target date: 10/31/2024  Patient will be independent in HEP to improve strength/mobility for better functional independence with ADLs.  Baseline: Goal status: INITIAL   LONG TERM GOALS: Target date: 12/12/2024  Patient will increase ABC scale score >80% to demonstrate better functional mobility and better confidence with ADLs.   Baseline: 09/19/24: 23.75%  Goal status: INITIAL  2.  Patient (> 79 years old) will complete five times sit to stand test in < 15 seconds indicating an increased LE strength and improved balance.  Baseline: 09/19/24: 48.55 seconds with BUE support   Goal status: INITIAL  3.  Patient will improve by 58m (164') in order to  demonstrate clinically significant improvement in cardiopulmonary endurance and community ambulation  Baseline: 09/19/24: 300' with SPC and several standing rest breaks Goal status: INITIAL  4.  Patient will increase Berg Balance score by > 6 points to demonstrate decreased fall risk during functional activities. Baseline: 09/19/24: 27/56 Goal status: INITIAL  5.  Patient will increase BLE gross strength to 4+/5 as to improve functional strength for independent gait, increased standing tolerance and increased ADL ability.  Baseline: 09/19/24: see chart above   Goal status: INITIAL  6.  Patient will reduce timed up and go to <11 seconds to reduce fall risk and demonstrate improved transfer/gait ability.  Baseline: 09/19/24: 23.35 seconds with use of SPC   Goal status: INITIAL  7. Patient will increase 10 meter walk test to >1.36m/s as to improve gait speed for better community ambulation and to reduce fall risk.   Baseline: 09/19/24: 0.41 m/s   Goal  status: INITIAL   ASSESSMENT:  CLINICAL IMPRESSION:  Pt responded well to the continuation of the testing necessary for goal generation, however was noted to have some chest pain noted with the 6 min walk test and required frequent lengthy standing rest breaks.  Pt and spouse educated on the importance of letting PCP know at his appointment later about the leg pain and the weeping that he is experiencing in the LE's.  Pt required frequent seated rest breaks during the BERG test as well.  Pt will continue to benefit from endurance training and improving overall balance in the process as well.   Pt will continue to benefit from skilled therapy to address remaining deficits in order to improve overall QoL and return to PLOF.      OBJECTIVE IMPAIRMENTS: Abnormal gait, cardiopulmonary status limiting activity, decreased activity tolerance, decreased balance, decreased endurance, difficulty walking, and decreased strength.   ACTIVITY LIMITATIONS:  bending, squatting, stairs, and locomotion level  PARTICIPATION LIMITATIONS: cleaning, laundry, community activity, and yard work  PERSONAL FACTORS: Age, Behavior pattern, Past/current experiences, and Time since onset of injury/illness/exacerbation are also affecting patient's functional outcome.   REHAB POTENTIAL: Good  CLINICAL DECISION MAKING: Evolving/moderate complexity  EVALUATION COMPLEXITY: Moderate  PLAN:  PT FREQUENCY: 1-2x/week  PT DURATION: 12 weeks  PLANNED INTERVENTIONS: 97164- PT Re-evaluation, 97750- Physical Performance Testing, 97110-Therapeutic exercises, 97530- Therapeutic activity, 97112- Neuromuscular re-education, 97535- Self Care, [id number]- Manual therapy, [phone]- Gait training, Patient/Family education, Balance training, Stair training, Joint mobilization, Joint manipulation, DME instructions, Cryotherapy, and Moist heat  PLAN FOR NEXT SESSION:  Initiate HEP, , BERG, dynamic/static balance training    Fonda Simpers, PT, DPT Physical Therapist - [place]  [facility]  09/25/2024, 9:31 AM    "

## 2024-09-25 NOTE — Therapy (Unsigned)
 " OUTPATIENT SPEECH LANGUAGE PATHOLOGY  COGNITION TREATMENT NOTE   Patient Name: Bryan Singleton MRN: [id number] DOB:15-May-1932, 89 y.o., male Today's Date: 09/25/2024  PCP: Ophelia Sage, MD REFERRING PROVIDER: Lauraine Rocks, PA   End of Session - 09/25/24 1019     Visit Number 4    Number of Visits 25    Date for Recertification  11/30/24    Authorization Type [employer]    Authorization Time Period 09/07/2024 thru 12/06/2024    Authorization - Visit Number 4    Authorization - Number of Visits 24    Progress Note Due on Visit 10    SLP Start Time 1015    SLP Stop Time  1100    SLP Time Calculation (min) 45 min    Activity Tolerance Patient tolerated treatment well          Past Medical History:  Diagnosis Date   COPD (chronic obstructive pulmonary disease) (HCC)    Coronary artery disease    Hypertension    Presence of permanent cardiac pacemaker    Past Surgical History:  Procedure Laterality Date   COLONOSCOPY N/A 07/10/2024   Procedure: COLONOSCOPY;  Surgeon: [name], MD;  Location: [facility] ENDOSCOPY;  Service: Endoscopy;  Laterality: N/A;   ESOPHAGOGASTRODUODENOSCOPY N/A 07/10/2024   Procedure: EGD (ESOPHAGOGASTRODUODENOSCOPY);  Surgeon: [name], MD;  Location[facility] ENDOSCOPY;  Service: Endoscopy;  Laterality: N/A;   GIVENS CAPSULE STUDY  07/10/2024   Procedure: IMAGING PROCEDURE, GI TRACT, INTRALUMINAL, VIA CAPSULE;  Surgeon: [name], MD;  Location: [facility] ENDOSCOPY;  Service: Endoscopy;;   HOT HEMOSTASIS  07/10/2024   Procedure: EGD, WITH ARGON PLASMA COAGULATION;  Surgeon: [name], MD;  Location: [facility] ENDOSCOPY;  Service: Endoscopy;;   PACEMAKER INSERTION N/A 06/04/2020   Procedure: GENERATOR Change out;  Surgeon: Ammon Blunt, MD;  Location: [facility] ORS;  Service: Cardiovascular;  Laterality: N/A;   POLYPECTOMY  07/10/2024   Procedure: POLYPECTOMY, INTESTINE;  Surgeon: [name], MD;  Location: [facility] ENDOSCOPY;  Service: Endoscopy;;    Patient Active Problem List   Diagnosis Date Noted   Acute blood loss anemia 07/10/2024   Angiodysplasia of intestinal tract 07/10/2024   Polyp of ascending colon 07/10/2024   Gout 07/07/2024   Symptomatic anemia 07/06/2024   Paroxysmal atrial fibrillation (HCC) 07/06/2024   Fall 08/21/2023   Syncope and collapse 08/19/2023   AKI (acute kidney injury) 08/19/2023   Generalized weakness 08/19/2023   Long term (current) use of warfarin 08/19/2023   SSS (sick sinus syndrome) (HCC) 08/19/2023   Hx of CABG x 5 08/19/2023   Subtherapeutic international normalized ratio (INR) 08/19/2023   Presence of permanent cardiac pacemaker    Coronary artery disease s/p CABG x 5    Controlled type 2 diabetes mellitus with complication, without long-term current use of insulin  (HCC) 03/03/2021   Chronic systolic CHF (congestive heart failure), NYHA class 2 (HCC) 04/19/2017   History of TIAs 02/05/2016   Peripheral vascular disease 02/05/2016   Severe tricuspid valve insufficiency 05/01/2015   Benign essential hypertension 04/24/2015   Moderate mitral insufficiency 09/24/2014   COPD (chronic obstructive pulmonary disease) (HCC) 03/02/2014    ONSET DATE: date of referral 09/01/2024   REFERRING DIAG: R41.3 (ICD-10-CM) - Memory loss   THERAPY DIAG:  Cognitive communication deficit  Rationale for Evaluation and Treatment Rehabilitation  SUBJECTIVE:   PERTINENT HISTORY and DIAGNOSTIC FINDINGS: Pt is a 89 year old male with gradual decline in short-term memory (per neurology note 08/31/2024 - Memory test score decreased  from 28/30 to 24/30 over the past year), history of cerebral infarction and transient ischemic attack, balance impairment with recent fall 3 weeks ago result in minor head inury (no loss of consciousness), chronic pain of lumbar spine.   CT Head 07/05/2024 Brain: Age related volume loss, only mildly progressive since the study of 2010, and less than often seen at this age. No  evidence of old or acute focal infarction, mass lesion, hemorrhage, hydrocephalus or extra-axial collection.   PAIN:  Are you having pain? No   FALLS: Has patient fallen in last 6 months?  Yes  LIVING ENVIRONMENT: Lives with: lives with their spouse Lives in: House/apartment  PLOF:  Level of assistance: Needed assistance with ADLs, Needed assistance with IADLS Employment: Retired   PATIENT GOALS   to improve cognitive function  SUBJECTIVE STATEMENT: Pt pleasant, she has become a nag (laughing) pt's wife has been having pt tell him to do more himself Pt accompanied by: significant other  OBJECTIVE:   TODAY'S TREATMENT:  Skilled treatment session targeted pt's cognitive communication goals. SLP facilitated session by providing the following interventions:  Pt's wife reports decreased ability to recall where items are located within cabinets. Verbal and written education targeting strategies were provided to label cabinets and also encourage pt to put away dishes on a daily basis vs when he feels like helping in the kitchen to promote increased familiarity and recall of object locations. Also recommend that pt seek a simpler model boat to put together to improve attention to task vs the complex one he currently has. Continued education provided on sleep hygiene including ceasing PO consumption during middle of the night to promote increased length of REM sleep.   PATIENT EDUCATION: Education details: see above Person educated: Patient and Spouse Education method: Explanation Education comprehension: needs further education   HOME EXERCISE PROGRAM:   As above - request that he have his hearing re-assessed Create schedule to target task completion throughout the day, everyday  GOALS:  Goals reviewed with patient? Yes  SHORT TERM GOALS: Target date: 10 sessions  Patient will participate in reminiscing when provided a visual prompt (e.g. Personal picture/item, magazine  photo, etc) and compensatory communication strategies with min support from clinician/family during 5/5 opportunities. Baseline: Goal status: INITIAL  2.   To identify emerging areas of strength/need, patient will participate in ongoing diagnostic treatment (Mini Addenbrooke's Cognitive Examination, paragraph recall, functional reading/writing) by 10th visit progress note  Baseline:  Goal status: INITIAL  3.  With Rare Min A, patient will use strategies to improve memory for important information with 75% (ie., white board, daily planner/calendar).   Baseline:  Goal status: INITIAL   LONG TERM GOALS: Target date: 11/30/2024  To improve carry-over and establish a home exercise program, the patient will complete recommended home exercises 5/7 days per week per patient log within 8 weeks of initial therapy session. Baseline:  Goal status: INITIAL  2.  With Mod I, patient will demonstrate understanding of functional cognitive activities by listing 2 activities for home maintenance program.  Baseline:  Goal status: INITIAL   ASSESSMENT:  CLINICAL IMPRESSION: Patient is a 89 y.o. male who was seen today for a cognitive communication treatment session including formal assessment of cognitive communication abilities. 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 He also has deficits in attention that might be multifactorial as related to his hearing - he intermittently requested repetition of verbal  information and there were occasions when didn't appear to know that he was being spoken too. Education provided on hearing and it's impact on cognitive function.    Pt with intermittent responsiveness to the above mentioned strategies to promote cognitive function. See the above treatment note for details.   OBJECTIVE IMPAIRMENTS include attention, memory, and executive functioning. These  impairments are limiting patient from managing medications, managing appointments, managing finances, household responsibilities, and ADLs/IADLs. Factors affecting potential to achieve goals and functional outcome are ability to learn/carryover information. Patient will benefit from skilled SLP services to address above impairments and improve overall function.  REHAB POTENTIAL: Good  PLAN: SLP FREQUENCY: 1-2x/week  SLP DURATION: 12 weeks  PLANNED INTERVENTIONS: Environmental controls, Cognitive reorganization, Internal/external aids, Functional tasks, SLP instruction and feedback, Compensatory strategies, and Patient/family education   Aloise Copus B. Rubbie, M.S., CCC-SLP, CBIS Speech-Language Pathologist Certified Brain Injury Specialist [facility]  [facility] [phone] Ascom [phone] Fax [phone]  "

## 2024-10-03 ENCOUNTER — Ambulatory Visit: Attending: Physician Assistant | Admitting: Speech Pathology

## 2024-10-03 DIAGNOSIS — R262 Difficulty in walking, not elsewhere classified: Secondary | ICD-10-CM | POA: Diagnosis present

## 2024-10-03 DIAGNOSIS — R41841 Cognitive communication deficit: Secondary | ICD-10-CM | POA: Insufficient documentation

## 2024-10-03 DIAGNOSIS — R2681 Unsteadiness on feet: Secondary | ICD-10-CM | POA: Diagnosis present

## 2024-10-03 DIAGNOSIS — M6281 Muscle weakness (generalized): Secondary | ICD-10-CM | POA: Diagnosis present

## 2024-10-03 NOTE — Therapy (Unsigned)
 " OUTPATIENT SPEECH LANGUAGE PATHOLOGY  COGNITION TREATMENT NOTE DISCHARGE SUMMARY   Patient Name: WADSWORTH SKOLNICK MRN: [id number] DOB:1932/01/15, 89 y.o., male Today's Date: 10/03/2024  PCP: Ophelia Sage, MD REFERRING PROVIDER: Lauraine Rocks, PA   End of Session - 10/03/24 1317     Visit Number 5    Number of Visits 25    Date for Recertification  11/30/24    Authorization Type [employer]    Authorization Time Period 09/07/2024 thru 12/06/2024    Authorization - Visit Number 5    Authorization - Number of Visits 24    Progress Note Due on Visit 10    SLP Start Time 1315    SLP Stop Time  1400    SLP Time Calculation (min) 45 min    Activity Tolerance Patient tolerated treatment well          Past Medical History:  Diagnosis Date   COPD (chronic obstructive pulmonary disease) (HCC)    Coronary artery disease    Hypertension    Presence of permanent cardiac pacemaker    Past Surgical History:  Procedure Laterality Date   COLONOSCOPY N/A 07/10/2024   Procedure: COLONOSCOPY;  Surgeon: [name], MD;  Location: [facility] ENDOSCOPY;  Service: Endoscopy;  Laterality: N/A;   ESOPHAGOGASTRODUODENOSCOPY N/A 07/10/2024   Procedure: EGD (ESOPHAGOGASTRODUODENOSCOPY);  Surgeon: [name], MD;  Location: [facility] ENDOSCOPY;  Service: Endoscopy;  Laterality: N/A;   GIVENS CAPSULE STUDY  07/10/2024   Procedure: IMAGING PROCEDURE, GI TRACT, INTRALUMINAL, VIA CAPSULE;  Surgeon: [name], MD;  Location: [facility] ENDOSCOPY;  Service: Endoscopy;;   HOT HEMOSTASIS  07/10/2024   Procedure: EGD, WITH ARGON PLASMA COAGULATION;  Surgeon: [name], MD;  Location: [facility] ENDOSCOPY;  Service: Endoscopy;;   PACEMAKER INSERTION N/A 06/04/2020   Procedure: GENERATOR Change out;  Surgeon: Ammon Blunt, MD;  Location: [facility] ORS;  Service: Cardiovascular;  Laterality: N/A;   POLYPECTOMY  07/10/2024   Procedure: POLYPECTOMY, INTESTINE;  Surgeon: [name], MD;  Location: [facility] ENDOSCOPY;   Service: Endoscopy;;   Patient Active Problem List   Diagnosis Date Noted   Acute blood loss anemia 07/10/2024   Angiodysplasia of intestinal tract 07/10/2024   Polyp of ascending colon 07/10/2024   Gout 07/07/2024   Symptomatic anemia 07/06/2024   Paroxysmal atrial fibrillation (HCC) 07/06/2024   Fall 08/21/2023   Syncope and collapse 08/19/2023   AKI (acute kidney injury) 08/19/2023   Generalized weakness 08/19/2023   Long term (current) use of warfarin 08/19/2023   SSS (sick sinus syndrome) (HCC) 08/19/2023   Hx of CABG x 5 08/19/2023   Subtherapeutic international normalized ratio (INR) 08/19/2023   Presence of permanent cardiac pacemaker    Coronary artery disease s/p CABG x 5    Controlled type 2 diabetes mellitus with complication, without long-term current use of insulin  (HCC) 03/03/2021   Chronic systolic CHF (congestive heart failure), NYHA class 2 (HCC) 04/19/2017   History of TIAs 02/05/2016   Peripheral vascular disease 02/05/2016   Severe tricuspid valve insufficiency 05/01/2015   Benign essential hypertension 04/24/2015   Moderate mitral insufficiency 09/24/2014   COPD (chronic obstructive pulmonary disease) (HCC) 03/02/2014    ONSET DATE: date of referral 09/01/2024   REFERRING DIAG: R41.3 (ICD-10-CM) - Memory loss   THERAPY DIAG:  Cognitive communication deficit  Rationale for Evaluation and Treatment Rehabilitation  SUBJECTIVE:   PERTINENT HISTORY and DIAGNOSTIC FINDINGS: Pt is a 89 year old male with gradual decline in short-term memory (per neurology note 08/31/2024 - Memory test  score decreased from 28/30 to 24/30 over the past year), history of cerebral infarction and transient ischemic attack, balance impairment with recent fall 3 weeks ago result in minor head inury (no loss of consciousness), chronic pain of lumbar spine.   CT Head 07/05/2024 Brain: Age related volume loss, only mildly progressive since the study of 2010, and less than often seen  at this age. No evidence of old or acute focal infarction, mass lesion, hemorrhage, hydrocephalus or extra-axial collection.   PAIN:  Are you having pain? No   FALLS: Has patient fallen in last 6 months?  Yes  LIVING ENVIRONMENT: Lives with: lives with their spouse Lives in: House/apartment  PLOF:  Level of assistance: Needed assistance with ADLs, Needed assistance with IADLS Employment: Retired   PATIENT GOALS   to improve cognitive function  SUBJECTIVE STATEMENT: Pt pleasant, he has got something special to tell you Pt accompanied by: significant other  OBJECTIVE:   TODAY'S TREATMENT:  Skilled treatment session targeted pt's cognitive communication goals. SLP facilitated session by providing the following interventions:  Pt reports that he went to the [employer] and helped complete flyers with his wife. They also report that pt has been going with me everywhere I go and he even gets up and gets his own things instead of asking me.  We are watching less TV than before, reading 4-5 pages of his book,    His wife reports that he is getting more and more forgetful. For example, when this writer asked pt how is physical therapy going? Pt responded, I haven't been there yet have I?  Education provided on respite care thru [place] if it might be needed in the future as well as information (and paperwork) for advanced directives for healthcare power of attorney as well as life alert.   PATIENT EDUCATION: Education details: see above Person educated: Patient and Spouse Education method: Explanation Education comprehension: needs further education   HOME EXERCISE PROGRAM:   As above  GOALS:  Goals reviewed with patient? Yes  SHORT TERM GOALS: Target date: 10 sessions  Updated: 10/04/2024 Patient will participate in reminiscing when provided a visual prompt (e.g. Personal picture/item, magazine photo, etc) and compensatory communication strategies with min support  from clinician/family during 5/5 opportunities. Baseline: Goal status: INITIAL: MET  2.   To identify emerging areas of strength/need, patient will participate in ongoing diagnostic treatment (Mini Addenbrooke's Cognitive Examination, paragraph recall, functional reading/writing) by 10th visit progress note  Baseline:  Goal status: INITIAL: MET  3.  With Rare Min A, patient will use strategies to improve memory for important information with 75% (ie., white board, daily planner/calendar).   Baseline:  Goal status: INITIAL: MET   LONG TERM GOALS: Target date: 11/30/2024  Updated: 10/04/2024 To improve carry-over and establish a home exercise program, the patient will complete recommended home exercises 5/7 days per week per patient log within 8 weeks of initial therapy session. Baseline:  Goal status: INITIAL: MET  2.  With Mod I, patient will demonstrate understanding of functional cognitive activities by listing 2 activities for home maintenance program.  Baseline:  Goal status: INITIAL: MET   ASSESSMENT:  CLINICAL IMPRESSION: Patient is a 89 y.o. male who was seen today for a cognitive communication treatment session including formal assessment of cognitive communication abilities. 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 He also has deficits in attention that might be multifactorial as related  to his hearing - he intermittently requested repetition of verbal information and there were occasions when didn't appear to know that he was being spoken too. Education provided on hearing and it's impact on cognitive function.    Pt with intermittent responsiveness to the above mentioned strategies to promote cognitive function. See the above treatment note for details. At this time, pt and his wife have completed all education, report implementation of most  recommendations/strategies. Per his wife's report we have been empowered to engage in more activities to increase our independence and reduce our risk of going into a nursing home. At this time skilled ST services are no longer indicated.     Crysta Gulick B. Rubbie, M.S., CCC-SLP, CBIS Speech-Language Pathologist Certified Brain Injury Specialist [facility]  [facility] [phone] Ascom [phone] Fax [phone]  "

## 2024-10-05 ENCOUNTER — Ambulatory Visit: Admitting: Speech Pathology

## 2024-10-05 ENCOUNTER — Ambulatory Visit

## 2024-10-05 DIAGNOSIS — R41841 Cognitive communication deficit: Secondary | ICD-10-CM | POA: Diagnosis not present

## 2024-10-05 DIAGNOSIS — R262 Difficulty in walking, not elsewhere classified: Secondary | ICD-10-CM

## 2024-10-05 DIAGNOSIS — M6281 Muscle weakness (generalized): Secondary | ICD-10-CM

## 2024-10-05 DIAGNOSIS — R2681 Unsteadiness on feet: Secondary | ICD-10-CM

## 2024-10-05 NOTE — Therapy (Signed)
 " OUTPATIENT PHYSICAL THERAPY NEURO TREATMENT   Patient Name: Bryan Singleton MRN: [id number] DOB:04-27-1932, 89 y.o., male Today's Date: 10/05/2024   PCP: Fernande Ophelia [name], MD REFERRING PROVIDER: Fernande Ophelia [name], MD  END OF SESSION:  PT End of Session - 10/05/24 1017     Visit Number 3    Number of Visits 24    Date for Recertification  12/12/24    PT Start Time 1018    PT Stop Time 1101    PT Time Calculation (min) 43 min    Activity Tolerance Patient tolerated treatment well    Behavior During Therapy WFL for tasks assessed/performed           Past Medical History:  Diagnosis Date   COPD (chronic obstructive pulmonary disease) (HCC)    Coronary artery disease    Hypertension    Presence of permanent cardiac pacemaker    Past Surgical History:  Procedure Laterality Date   COLONOSCOPY N/A 07/10/2024   Procedure: COLONOSCOPY;  Surgeon: [name], MD;  Location: [facility] ENDOSCOPY;  Service: Endoscopy;  Laterality: N/A;   ESOPHAGOGASTRODUODENOSCOPY N/A 07/10/2024   Procedure: EGD (ESOPHAGOGASTRODUODENOSCOPY);  Surgeon: [name], MD;  Location: [facility] ENDOSCOPY;  Service: Endoscopy;  Laterality: N/A;   GIVENS CAPSULE STUDY  07/10/2024   Procedure: IMAGING PROCEDURE, GI TRACT, INTRALUMINAL, VIA CAPSULE;  Surgeon: [name], MD;  Location: [facility] ENDOSCOPY;  Service: Endoscopy;;   HOT HEMOSTASIS  07/10/2024   Procedure: EGD, WITH ARGON PLASMA COAGULATION;  Surgeon: [name], MD;  Location: [facility] ENDOSCOPY;  Service: Endoscopy;;   PACEMAKER INSERTION N/A 06/04/2020   Procedure: GENERATOR Change out;  Surgeon: Ammon Blunt, MD;  Location: [facility] ORS;  Service: Cardiovascular;  Laterality: N/A;   POLYPECTOMY  07/10/2024   Procedure: POLYPECTOMY, INTESTINE;  Surgeon: [name], MD;  Location: [facility] ENDOSCOPY;  Service: Endoscopy;;   Patient Active Problem List   Diagnosis Date Noted   Acute blood loss anemia 07/10/2024   Angiodysplasia of intestinal tract  07/10/2024   Polyp of ascending colon 07/10/2024   Gout 07/07/2024   Symptomatic anemia 07/06/2024   Paroxysmal atrial fibrillation (HCC) 07/06/2024   Fall 08/21/2023   Syncope and collapse 08/19/2023   AKI (acute kidney injury) 08/19/2023   Generalized weakness 08/19/2023   Long term (current) use of warfarin 08/19/2023   SSS (sick sinus syndrome) (HCC) 08/19/2023   Hx of CABG x 5 08/19/2023   Subtherapeutic international normalized ratio (INR) 08/19/2023   Presence of permanent cardiac pacemaker    Coronary artery disease s/p CABG x 5    Controlled type 2 diabetes mellitus with complication, without long-term current use of insulin  (HCC) 03/03/2021   Chronic systolic CHF (congestive heart failure), NYHA class 2 (HCC) 04/19/2017   History of TIAs 02/05/2016   Peripheral vascular disease 02/05/2016   Severe tricuspid valve insufficiency 05/01/2015   Benign essential hypertension 04/24/2015   Moderate mitral insufficiency 09/24/2014   COPD (chronic obstructive pulmonary disease) (HCC) 03/02/2014    ONSET DATE: chronic  REFERRING DIAG: R26.89 (ICD-10-CM) - Imbalance  THERAPY DIAG:  No diagnosis found.  Rationale for Evaluation and Treatment: Rehabilitation  SUBJECTIVE:  SUBJECTIVE STATEMENT:  Pt arrived with wife and reports doing well today. He reports he has been walking up/down the hall. Pt denies any falls since last time he was here.    Pt accompanied by: significant other  PERTINENT HISTORY:   Per Dr. [name] note from 08/14/24, they attempt to walk but find it difficult due to having to 'drag one of my legs'. They can walk about fifty yards with the help of a neighbor's porch handle, with variability influenced by weather conditions.   PAIN:  Are you having pain?  No  PRECAUTIONS: Fall  RED FLAGS: None   WEIGHT BEARING RESTRICTIONS: No  FALLS: Has patient fallen in last 6 months? Yes. Number of falls 1  LIVING ENVIRONMENT: Lives with: lives with their spouse Lives in: House/apartment Stairs: Yes: External: 3 steps; bilateral but cannot reach both Has following equipment at home: Single point cane and Walker - 2 wheeled  PLOF: Independent with use of cane  PATIENT GOALS: to get stronger and more independent  OBJECTIVE:  Note: Objective measures were completed at Evaluation unless otherwise noted.  DIAGNOSTIC FINDINGS: n/a  COGNITION: Overall cognitive status: History of cognitive impairments - at baseline - progressive memory loss    SENSATION: Hx of peripheral neuropathy   COORDINATION: Not tested   POSTURE: rounded shoulders and forward head  LOWER EXTREMITY ROM:     Active  Right Eval Left Eval  Hip flexion    Hip extension    Hip abduction    Hip adduction    Hip internal rotation    Hip external rotation    Knee flexion    Knee extension    Ankle dorsiflexion    Ankle plantarflexion    Ankle inversion    Ankle eversion     (Blank rows = not tested)  LOWER EXTREMITY MMT:    MMT Right Eval Left Eval  Hip flexion 4- 4-  Hip extension    Hip abduction 4 4  Hip adduction 4 4  Hip internal rotation    Hip external rotation    Knee flexion 4+ 4+  Knee extension 4+ 4+  Ankle dorsiflexion 4- 4-  Ankle plantarflexion    Ankle inversion    Ankle eversion    (Blank rows = not tested)  BED MOBILITY:  Not tested  TRANSFERS: Sit to stand: CGA  Assistive device utilized: BUE pushing from seat      RAMP:  Not tested  CURB:  Not tested  STAIRS: Not tested GAIT: Findings: Gait Characteristics: decreased stride length and poor foot clearance- Right, Distance walked: 150', Assistive device utilized:Single point cane, Level of assistance: SBA, and Comments: Patient feels that he is dragging R foot,  however no noticeable drag this date (may worsen with fatigue)  FUNCTIONAL TESTS:  5 times sit to stand: 48.55 seconds with BUE support on chair (unable to complete with no UE support) 10 meter walk test: 0.41 m/s with use of SPC  TUG: 23.35 seconds with use of SPC  6 minute walk test: 300' with SPC, multiple standing rest breaks Berg Balance Scale: 27/56  PATIENT SURVEYS:  ABC scale: The Activities-Specific Balance Confidence (ABC) Scale 0% 10 20 30  40 50 60 70 80 90 100% No confidence<->completely confident  How confident are you that you will not lose your balance or become unsteady when you . . .   Date tested 09/19/24  Walk around the house 80%  2. Walk up or down stairs 10%  3. [employer]  over and pick up a slipper from in front of a closet floor 0%  4. Reach for a small can off a shelf at eye level 100%  5. Stand on tip toes and reach for something above your head 0%  6. Stand on a chair and reach for something 0%  7. Sweep the floor 0%  8. Walk outside the house to a car parked in the driveway 70%  9. Get into or out of a car 100%  10. Walk across a parking lot to the mall 10%  11. Walk up or down a ramp 0%  12. Walk in a crowded mall where people rapidly walk past you 0%  13. Are bumped into by people as you walk through the mall 0%  14. Step onto or off of an escalator while you are holding onto the railing 10%  15. Step onto or off an escalator while holding onto parcels such that you cannot hold onto the railing 0%  16. Walk outside on icy sidewalks 0%  Total: #/16 23.75%                                                                                                                                 TREATMENT DATE: 10/05/2024   -Gait 340' with rollator CGA for safety.   -Seated LAQ: 3 lb. AW 2x10 each.   -Seated marches: 3 lb. AW x20  -Sit to stands: x10 with use of UE.   -Standing on Airex: 3x30'' normal stance  -Standing narrow BOS with EC: 3x30''   -Gait: 150'  with rollator CGA for safety.   PATIENT EDUCATION: Education details:  POC, goals  Person educated: Patient Education method: Explanation, Demonstration, and Handouts Education comprehension: verbalized understanding and returned demonstration  HOME EXERCISE PROGRAM: Access Code: [email or web address] URL: [email or web address] Date: 09/25/2024 Prepared by: Sidra Simpers  Exercises - Standing Hip Abduction with Counter Support  - 1 x daily - 7 x weekly - 3 sets - 10 reps - Standing March with Counter Support  - 1 x daily - 7 x weekly - 3 sets - 10 reps - Standing Hip Extension with Counter Support  - 1 x daily - 7 x weekly - 3 sets - 10 reps - Standing 4-Way Leg Reach with Counter Support  - 1 x daily - 7 x weekly - 3 sets - 10 reps - Standing Knee Flexion with Counter Support  - 1 x daily - 7 x weekly - 3 sets - 10 reps  GOALS: Goals reviewed with patient? Yes  SHORT TERM GOALS: Target date: 10/31/2024  Patient will be independent in HEP to improve strength/mobility for better functional independence with ADLs.  Baseline: Goal status: INITIAL   LONG TERM GOALS: Target date: 12/12/2024  Patient will increase ABC scale score >80% to demonstrate better functional mobility and better confidence with ADLs.   Baseline: 09/19/24: 23.75%  Goal status: INITIAL  2.  Patient (>  59 years old) will complete five times sit to stand test in < 15 seconds indicating an increased LE strength and improved balance.  Baseline: 09/19/24: 48.55 seconds with BUE support   Goal status: INITIAL  3.  Patient will improve by 46m (164') in order to demonstrate clinically significant improvement in cardiopulmonary endurance and community ambulation  Baseline: 09/19/24: 300' with SPC and several standing rest breaks Goal status: INITIAL  4.  Patient will increase Berg Balance score by > 6 points to demonstrate decreased fall risk during functional activities. Baseline: 09/19/24: 27/56 Goal  status: INITIAL  5.  Patient will increase BLE gross strength to 4+/5 as to improve functional strength for independent gait, increased standing tolerance and increased ADL ability.  Baseline: 09/19/24: see chart above   Goal status: INITIAL  6.  Patient will reduce timed up and go to <11 seconds to reduce fall risk and demonstrate improved transfer/gait ability.  Baseline: 09/19/24: 23.35 seconds with use of SPC   Goal status: INITIAL  7. Patient will increase 10 meter walk test to >1.27m/s as to improve gait speed for better community ambulation and to reduce fall risk.   Baseline: 09/19/24: 0.41 m/s   Goal status: INITIAL   ASSESSMENT:  CLINICAL IMPRESSION:  Pt highly motivated to improve throughout today's treatment session. Treatment focused on improving balance, LE strength, and activity tolerance. Patient noted to get fatigued very quickly and [facility] requiring increased rest breaks. Attempted step-ups but were too painful on knees therefore terminated that exercise. Patient did practice gait with rollator today which he liked due to less restriction compared to 2 WW he has at home plus with the option to sit when needed. He was only able to ambulate max of 1 lap prior to requesting to sit. Decreased balance noted with romberg EC and airex stance, but his balance strategies did improve with practice today throughout tx session. Overall, he tolerated treatment well and is a good candidate for continuing skilled PT at this time for further improvement toward his goals.     OBJECTIVE IMPAIRMENTS: Abnormal gait, cardiopulmonary status limiting activity, decreased activity tolerance, decreased balance, decreased endurance, difficulty walking, and decreased strength.   ACTIVITY LIMITATIONS: bending, squatting, stairs, and locomotion level  PARTICIPATION LIMITATIONS: cleaning, laundry, community activity, and yard work  PERSONAL FACTORS: Age, Behavior pattern, Past/current experiences, and  Time since onset of injury/illness/exacerbation are also affecting patient's functional outcome.   REHAB POTENTIAL: Good  CLINICAL DECISION MAKING: Evolving/moderate complexity  EVALUATION COMPLEXITY: Moderate  PLAN:  PT FREQUENCY: 1-2x/week  PT DURATION: 12 weeks  PLANNED INTERVENTIONS: 97164- PT Re-evaluation, 97750- Physical Performance Testing, 97110-Therapeutic exercises, 97530- Therapeutic activity, 97112- Neuromuscular re-education, 97535- Self Care, [id number]- Manual therapy, [phone]- Gait training, Patient/Family education, Balance training, Stair training, Joint mobilization, Joint manipulation, DME instructions, Cryotherapy, and Moist heat  PLAN FOR NEXT SESSION:  Initiate HEP, , BERG, dynamic/static balance training    Norman Sharps, PT, DPT Physical Therapist - [facility]  [facility]  10/05/2024, 11:56 AM    "

## 2024-10-09 ENCOUNTER — Ambulatory Visit: Admitting: Physical Therapy

## 2024-10-09 DIAGNOSIS — R2681 Unsteadiness on feet: Secondary | ICD-10-CM

## 2024-10-09 DIAGNOSIS — R41841 Cognitive communication deficit: Secondary | ICD-10-CM | POA: Diagnosis not present

## 2024-10-09 DIAGNOSIS — M6281 Muscle weakness (generalized): Secondary | ICD-10-CM

## 2024-10-09 DIAGNOSIS — R262 Difficulty in walking, not elsewhere classified: Secondary | ICD-10-CM

## 2024-10-09 NOTE — Therapy (Signed)
 " OUTPATIENT PHYSICAL THERAPY NEURO TREATMENT   Patient Name: Bryan Singleton MRN: [id number] DOB:30-Nov-1931, 89 y.o., male Today's Date: 10/09/2024   PCP: Fernande Ophelia [name], MD REFERRING PROVIDER: Fernande Ophelia [name], MD  END OF SESSION:  PT End of Session - 10/09/24 0941     Visit Number 4    Number of Visits 24    Date for Recertification  12/12/24    Progress Note Due on Visit 10    PT Start Time 0934    PT Stop Time 1013    PT Time Calculation (min) 39 min    Activity Tolerance Patient tolerated treatment well    Behavior During Therapy [facility] for tasks assessed/performed            Past Medical History:  Diagnosis Date   COPD (chronic obstructive pulmonary disease) (HCC)    Coronary artery disease    Hypertension    Presence of permanent cardiac pacemaker    Past Surgical History:  Procedure Laterality Date   COLONOSCOPY N/A 07/10/2024   Procedure: COLONOSCOPY;  Surgeon: [name], MD;  Location: [facility] ENDOSCOPY;  Service: Endoscopy;  Laterality: N/A;   ESOPHAGOGASTRODUODENOSCOPY N/A 07/10/2024   Procedure: EGD (ESOPHAGOGASTRODUODENOSCOPY);  Surgeon: [name], MD;  Location: [facility] ENDOSCOPY;  Service: Endoscopy;  Laterality: N/A;   GIVENS CAPSULE STUDY  07/10/2024   Procedure: IMAGING PROCEDURE, GI TRACT, INTRALUMINAL, VIA CAPSULE;  Surgeon: [name], MD;  Location: [facility] ENDOSCOPY;  Service: Endoscopy;;   HOT HEMOSTASIS  07/10/2024   Procedure: EGD, WITH ARGON PLASMA COAGULATION;  Surgeon: [name], MD;  Location: [facility] ENDOSCOPY;  Service: Endoscopy;;   PACEMAKER INSERTION N/A 06/04/2020   Procedure: GENERATOR Change out;  Surgeon: Ammon Blunt, MD;  Location: [facility] ORS;  Service: Cardiovascular;  Laterality: N/A;   POLYPECTOMY  07/10/2024   Procedure: POLYPECTOMY, INTESTINE;  Surgeon: [name], MD;  Location: [facility] ENDOSCOPY;  Service: Endoscopy;;   Patient Active Problem List   Diagnosis Date Noted   Acute blood loss anemia 07/10/2024    Angiodysplasia of intestinal tract 07/10/2024   Polyp of ascending colon 07/10/2024   Gout 07/07/2024   Symptomatic anemia 07/06/2024   Paroxysmal atrial fibrillation (HCC) 07/06/2024   Fall 08/21/2023   Syncope and collapse 08/19/2023   AKI (acute kidney injury) 08/19/2023   Generalized weakness 08/19/2023   Long term (current) use of warfarin 08/19/2023   SSS (sick sinus syndrome) (HCC) 08/19/2023   Hx of CABG x 5 08/19/2023   Subtherapeutic international normalized ratio (INR) 08/19/2023   Presence of permanent cardiac pacemaker    Coronary artery disease s/p CABG x 5    Controlled type 2 diabetes mellitus with complication, without long-term current use of insulin  (HCC) 03/03/2021   Chronic systolic CHF (congestive heart failure), NYHA class 2 (HCC) 04/19/2017   History of TIAs 02/05/2016   Peripheral vascular disease 02/05/2016   Severe tricuspid valve insufficiency 05/01/2015   Benign essential hypertension 04/24/2015   Moderate mitral insufficiency 09/24/2014   COPD (chronic obstructive pulmonary disease) (HCC) 03/02/2014    ONSET DATE: chronic  REFERRING DIAG: R26.89 (ICD-10-CM) - Imbalance  THERAPY DIAG:  Unsteadiness on feet  Muscle weakness (generalized)  Difficulty in walking, not elsewhere classified  Rationale for Evaluation and Treatment: Rehabilitation  SUBJECTIVE:  SUBJECTIVE STATEMENT:  Pt arrived with wife and reports doing well today. Pt denies any falls since last time he was here. Has been walking in hallway at home.    Pt accompanied by: significant other  PERTINENT HISTORY:   Per Dr. [name] note from 08/14/24, they attempt to walk but find it difficult due to having to 'drag one of my legs'. They can walk about fifty yards with the help of a neighbor's porch  handle, with variability influenced by weather conditions.   PAIN:  Are you having pain? No  PRECAUTIONS: Fall  RED FLAGS: None   WEIGHT BEARING RESTRICTIONS: No  FALLS: Has patient fallen in last 6 months? Yes. Number of falls 1  LIVING ENVIRONMENT: Lives with: lives with their spouse Lives in: House/apartment Stairs: Yes: External: 3 steps; bilateral but cannot reach both Has following equipment at home: Single point cane and Walker - 2 wheeled  PLOF: Independent with use of cane  PATIENT GOALS: to get stronger and more independent  OBJECTIVE:  Note: Objective measures were completed at Evaluation unless otherwise noted.  DIAGNOSTIC FINDINGS: n/a  COGNITION: Overall cognitive status: History of cognitive impairments - at baseline - progressive memory loss    SENSATION: Hx of peripheral neuropathy   COORDINATION: Not tested   POSTURE: rounded shoulders and forward head  LOWER EXTREMITY ROM:     Active  Right Eval Left Eval  Hip flexion    Hip extension    Hip abduction    Hip adduction    Hip internal rotation    Hip external rotation    Knee flexion    Knee extension    Ankle dorsiflexion    Ankle plantarflexion    Ankle inversion    Ankle eversion     (Blank rows = not tested)  LOWER EXTREMITY MMT:    MMT Right Eval Left Eval  Hip flexion 4- 4-  Hip extension    Hip abduction 4 4  Hip adduction 4 4  Hip internal rotation    Hip external rotation    Knee flexion 4+ 4+  Knee extension 4+ 4+  Ankle dorsiflexion 4- 4-  Ankle plantarflexion    Ankle inversion    Ankle eversion    (Blank rows = not tested)  BED MOBILITY:  Not tested  TRANSFERS: Sit to stand: CGA  Assistive device utilized: BUE pushing from seat      RAMP:  Not tested  CURB:  Not tested  STAIRS: Not tested GAIT: Findings: Gait Characteristics: decreased stride length and poor foot clearance- Right, Distance walked: 150', Assistive device utilized:Single point  cane, Level of assistance: SBA, and Comments: Patient feels that he is dragging R foot, however no noticeable drag this date (may worsen with fatigue)  FUNCTIONAL TESTS:  5 times sit to stand: 48.55 seconds with BUE support on chair (unable to complete with no UE support) 10 meter walk test: 0.41 m/s with use of SPC  TUG: 23.35 seconds with use of SPC  6 minute walk test: 300' with SPC, multiple standing rest breaks Berg Balance Scale: 27/56  PATIENT SURVEYS:  ABC scale: The Activities-Specific Balance Confidence (ABC) Scale 0% 10 20 30  40 50 60 70 80 90 100% No confidence<->completely confident  How confident are you that you will not lose your balance or become unsteady when you . . .   Date tested 09/19/24  Walk around the house 80%  2. Walk up or down stairs 10%  3. Bend over and  pick up a slipper from in front of a closet floor 0%  4. Reach for a small can off a shelf at eye level 100%  5. Stand on tip toes and reach for something above your head 0%  6. Stand on a chair and reach for something 0%  7. Sweep the floor 0%  8. Walk outside the house to a car parked in the driveway 70%  9. Get into or out of a car 100%  10. Walk across a parking lot to the mall 10%  11. Walk up or down a ramp 0%  12. Walk in a crowded mall where people rapidly walk past you 0%  13. Are bumped into by people as you walk through the mall 0%  14. Step onto or off of an escalator while you are holding onto the railing 10%  15. Step onto or off an escalator while holding onto parcels such that you cannot hold onto the railing 0%  16. Walk outside on icy sidewalks 0%  Total: #/16 23.75%                                                                                                                                 TREATMENT DATE: 10/09/2024   Nustep x 8 min L 2-5 double hill setting  Gait will rollator to treatment chair x 30 ft   HS curl 2 x 10 ea LE GTB   -Gait 340' with rollator CGA for safety.    -Seated Heel raise with hold and 3# AW 2 x 15 reps   Gait no AD x 30 ft with turn around object - CGA  Gait no AD x 40 ft to transport chair to end session - CGA  Pt required occasional rest breaks due fatigue, PT was attentive to when pt appeared to be tired or winded in order to prevent excessive fatigue.  Unless otherwise stated, CGA was provided and gait belt donned in order to ensure pt safety   PATIENT EDUCATION: Education details:  POC, goals  Person educated: Patient Education method: Explanation, Demonstration, and Handouts Education comprehension: verbalized understanding and returned demonstration  HOME EXERCISE PROGRAM: Access Code: [email or web address] URL: [email or web address] Date: 09/25/2024 Prepared by: Sidra Simpers  Exercises - Standing Hip Abduction with Counter Support  - 1 x daily - 7 x weekly - 3 sets - 10 reps - Standing March with Counter Support  - 1 x daily - 7 x weekly - 3 sets - 10 reps - Standing Hip Extension with Counter Support  - 1 x daily - 7 x weekly - 3 sets - 10 reps - Standing 4-Way Leg Reach with Counter Support  - 1 x daily - 7 x weekly - 3 sets - 10 reps - Standing Knee Flexion with Counter Support  - 1 x daily - 7 x weekly - 3 sets - 10 reps  GOALS: Goals reviewed with patient? Yes  SHORT TERM  GOALS: Target date: 10/31/2024  Patient will be independent in HEP to improve strength/mobility for better functional independence with ADLs.  Baseline: Goal status: INITIAL   LONG TERM GOALS: Target date: 12/12/2024  Patient will increase ABC scale score >80% to demonstrate better functional mobility and better confidence with ADLs.   Baseline: 09/19/24: 23.75%  Goal status: INITIAL  2.  Patient (> 3 years old) will complete five times sit to stand test in < 15 seconds indicating an increased LE strength and improved balance.  Baseline: 09/19/24: 48.55 seconds with BUE support   Goal status: INITIAL  3.  Patient will improve  by 40m (164') in order to demonstrate clinically significant improvement in cardiopulmonary endurance and community ambulation  Baseline: 09/19/24: 300' with SPC and several standing rest breaks Goal status: INITIAL  4.  Patient will increase Berg Balance score by > 6 points to demonstrate decreased fall risk during functional activities. Baseline: 09/19/24: 27/56 Goal status: INITIAL  5.  Patient will increase BLE gross strength to 4+/5 as to improve functional strength for independent gait, increased standing tolerance and increased ADL ability.  Baseline: 09/19/24: see chart above   Goal status: INITIAL  6.  Patient will reduce timed up and go to <11 seconds to reduce fall risk and demonstrate improved transfer/gait ability.  Baseline: 09/19/24: 23.35 seconds with use of SPC   Goal status: INITIAL  7. Patient will increase 10 meter walk test to >1.40m/s as to improve gait speed for better community ambulation and to reduce fall risk.   Baseline: 09/19/24: 0.41 m/s   Goal status: INITIAL   ASSESSMENT:  CLINICAL IMPRESSION:  Pt highly motivated to improve throughout today's treatment session. Treatment focused on improving balance, LE strength, and activity tolerance. Patient noted to get fatigued very quickly and [facility] requiring increased rest breaks. Pt gait without AD slow but relaitvely stable, could be good intervention as this is how pt ambulating at home currently. Overall, he tolerated treatment well and is a good candidate for continuing skilled PT at this time for further improvement toward his goals.     OBJECTIVE IMPAIRMENTS: Abnormal gait, cardiopulmonary status limiting activity, decreased activity tolerance, decreased balance, decreased endurance, difficulty walking, and decreased strength.   ACTIVITY LIMITATIONS: bending, squatting, stairs, and locomotion level  PARTICIPATION LIMITATIONS: cleaning, laundry, community activity, and yard work  PERSONAL FACTORS: Age,  Behavior pattern, Past/current experiences, and Time since onset of injury/illness/exacerbation are also affecting patient's functional outcome.   REHAB POTENTIAL: Good  CLINICAL DECISION MAKING: Evolving/moderate complexity  EVALUATION COMPLEXITY: Moderate  PLAN:  PT FREQUENCY: 1-2x/week  PT DURATION: 12 weeks  PLANNED INTERVENTIONS: [id number]- PT Re-evaluation, 97750- Physical Performance Testing, 97110-Therapeutic exercises, 97530- Therapeutic activity, 97112- Neuromuscular re-education, 97535- Self Care, [id number]- Manual therapy, [phone]- Gait training, Patient/Family education, Balance training, Stair training, Joint mobilization, Joint manipulation, DME instructions, Cryotherapy, and Moist heat  PLAN FOR NEXT SESSION:  Initiate HEP, , BERG, dynamic/static balance training    Note: Portions of this document were prepared using Dragon voice recognition software and although reviewed may contain unintentional dictation errors in syntax, grammar, or spelling.  Lonni KATHEE Gainer PT ,DPT Physical Therapist- [facility]    10/09/2024, 9:42 AM    "

## 2024-10-12 ENCOUNTER — Ambulatory Visit: Admitting: Speech Pathology

## 2024-10-12 ENCOUNTER — Ambulatory Visit: Admitting: Physical Therapy

## 2024-10-12 DIAGNOSIS — R262 Difficulty in walking, not elsewhere classified: Secondary | ICD-10-CM

## 2024-10-12 DIAGNOSIS — M6281 Muscle weakness (generalized): Secondary | ICD-10-CM

## 2024-10-12 DIAGNOSIS — R2681 Unsteadiness on feet: Secondary | ICD-10-CM

## 2024-10-12 NOTE — Therapy (Signed)
 " OUTPATIENT PHYSICAL THERAPY NEURO TREATMENT   Patient Name: Bryan Singleton MRN: [id number] DOB:09-02-1932, 89 y.o., male Today's Date: 10/13/2024   PCP: Fernande Ophelia [name], MD REFERRING PROVIDER: Fernande Ophelia [name], MD  END OF SESSION:  PT End of Session - 10/12/24 1428     Visit Number 5    Number of Visits 24    Date for Recertification  12/12/24    Progress Note Due on Visit 10    PT Start Time 1445    PT Stop Time 1527    PT Time Calculation (min) 42 min    Activity Tolerance Patient tolerated treatment well    Behavior During Therapy WFL for tasks assessed/performed            Past Medical History:  Diagnosis Date   COPD (chronic obstructive pulmonary disease) (HCC)    Coronary artery disease    Hypertension    Presence of permanent cardiac pacemaker    Past Surgical History:  Procedure Laterality Date   COLONOSCOPY N/A 07/10/2024   Procedure: COLONOSCOPY;  Surgeon: [name], MD;  Location: [facility] ENDOSCOPY;  Service: Endoscopy;  Laterality: N/A;   ESOPHAGOGASTRODUODENOSCOPY N/A 07/10/2024   Procedure: EGD (ESOPHAGOGASTRODUODENOSCOPY);  Surgeon: [name], MD;  Location: [facility] ENDOSCOPY;  Service: Endoscopy;  Laterality: N/A;   GIVENS CAPSULE STUDY  07/10/2024   Procedure: IMAGING PROCEDURE, GI TRACT, INTRALUMINAL, VIA CAPSULE;  Surgeon: [name], MD;  Location: [facility] ENDOSCOPY;  Service: Endoscopy;;   HOT HEMOSTASIS  07/10/2024   Procedure: EGD, WITH ARGON PLASMA COAGULATION;  Surgeon: [name], MD;  Location: [facility] ENDOSCOPY;  Service: Endoscopy;;   PACEMAKER INSERTION N/A 06/04/2020   Procedure: GENERATOR Change out;  Surgeon: Ammon Blunt, MD;  Location: [facility] ORS;  Service: Cardiovascular;  Laterality: N/A;   POLYPECTOMY  07/10/2024   Procedure: POLYPECTOMY, INTESTINE;  Surgeon: [name], MD;  Location: [facility] ENDOSCOPY;  Service: Endoscopy;;   Patient Active Problem List   Diagnosis Date Noted   Acute blood loss anemia 07/10/2024    Angiodysplasia of intestinal tract 07/10/2024   Polyp of ascending colon 07/10/2024   Gout 07/07/2024   Symptomatic anemia 07/06/2024   Paroxysmal atrial fibrillation (HCC) 07/06/2024   Fall 08/21/2023   Syncope and collapse 08/19/2023   AKI (acute kidney injury) 08/19/2023   Generalized weakness 08/19/2023   Long term (current) use of warfarin 08/19/2023   SSS (sick sinus syndrome) (HCC) 08/19/2023   Hx of CABG x 5 08/19/2023   Subtherapeutic international normalized ratio (INR) 08/19/2023   Presence of permanent cardiac pacemaker    Coronary artery disease s/p CABG x 5    Controlled type 2 diabetes mellitus with complication, without long-term current use of insulin  (HCC) 03/03/2021   Chronic systolic CHF (congestive heart failure), NYHA class 2 (HCC) 04/19/2017   History of TIAs 02/05/2016   Peripheral vascular disease 02/05/2016   Severe tricuspid valve insufficiency 05/01/2015   Benign essential hypertension 04/24/2015   Moderate mitral insufficiency 09/24/2014   COPD (chronic obstructive pulmonary disease) (HCC) 03/02/2014    ONSET DATE: chronic  REFERRING DIAG: R26.89 (ICD-10-CM) - Imbalance  THERAPY DIAG:  Unsteadiness on feet  Muscle weakness (generalized)  Difficulty in walking, not elsewhere classified  Rationale for Evaluation and Treatment: Rehabilitation  SUBJECTIVE:  SUBJECTIVE STATEMENT:  Pt arrived with wife and reports doing well today. Pt denies any falls since last time he was here. Has been walking in hallway at home no AD.   Pt accompanied by: significant other  PERTINENT HISTORY:   Per Dr. [name] note from 08/14/24, they attempt to walk but find it difficult due to having to 'drag one of my legs'. They can walk about fifty yards with the help of a neighbor's  porch handle, with variability influenced by weather conditions.   PAIN:  Are you having pain? No  PRECAUTIONS: Fall  RED FLAGS: None   WEIGHT BEARING RESTRICTIONS: No  FALLS: Has patient fallen in last 6 months? Yes. Number of falls 1  LIVING ENVIRONMENT: Lives with: lives with their spouse Lives in: House/apartment Stairs: Yes: External: 3 steps; bilateral but cannot reach both Has following equipment at home: Single point cane and Walker - 2 wheeled  PLOF: Independent with use of cane  PATIENT GOALS: to get stronger and more independent  OBJECTIVE:  Note: Objective measures were completed at Evaluation unless otherwise noted.  DIAGNOSTIC FINDINGS: n/a  COGNITION: Overall cognitive status: History of cognitive impairments - at baseline - progressive memory loss    SENSATION: Hx of peripheral neuropathy   COORDINATION: Not tested   POSTURE: rounded shoulders and forward head  LOWER EXTREMITY ROM:     Active  Right Eval Left Eval  Hip flexion    Hip extension    Hip abduction    Hip adduction    Hip internal rotation    Hip external rotation    Knee flexion    Knee extension    Ankle dorsiflexion    Ankle plantarflexion    Ankle inversion    Ankle eversion     (Blank rows = not tested)  LOWER EXTREMITY MMT:    MMT Right Eval Left Eval  Hip flexion 4- 4-  Hip extension    Hip abduction 4 4  Hip adduction 4 4  Hip internal rotation    Hip external rotation    Knee flexion 4+ 4+  Knee extension 4+ 4+  Ankle dorsiflexion 4- 4-  Ankle plantarflexion    Ankle inversion    Ankle eversion    (Blank rows = not tested)  BED MOBILITY:  Not tested  TRANSFERS: Sit to stand: CGA  Assistive device utilized: BUE pushing from seat      RAMP:  Not tested  CURB:  Not tested  STAIRS: Not tested GAIT: Findings: Gait Characteristics: decreased stride length and poor foot clearance- Right, Distance walked: 150', Assistive device utilized:Single  point cane, Level of assistance: SBA, and Comments: Patient feels that he is dragging R foot, however no noticeable drag this date (may worsen with fatigue)  FUNCTIONAL TESTS:  5 times sit to stand: 48.55 seconds with BUE support on chair (unable to complete with no UE support) 10 meter walk test: 0.41 m/s with use of SPC  TUG: 23.35 seconds with use of SPC  6 minute walk test: 300' with SPC, multiple standing rest breaks Berg Balance Scale: 27/56  PATIENT SURVEYS:  ABC scale: The Activities-Specific Balance Confidence (ABC) Scale 0% 10 20 30  40 50 60 70 80 90 100% No confidence<->completely confident  How confident are you that you will not lose your balance or become unsteady when you . . .   Date tested 09/19/24  Walk around the house 80%  2. Walk up or down stairs 10%  3. Bend over  and pick up a slipper from in front of a closet floor 0%  4. Reach for a small can off a shelf at eye level 100%  5. Stand on tip toes and reach for something above your head 0%  6. Stand on a chair and reach for something 0%  7. Sweep the floor 0%  8. Walk outside the house to a car parked in the driveway 70%  9. Get into or out of a car 100%  10. Walk across a parking lot to the mall 10%  11. Walk up or down a ramp 0%  12. Walk in a crowded mall where people rapidly walk past you 0%  13. Are bumped into by people as you walk through the mall 0%  14. Step onto or off of an escalator while you are holding onto the railing 10%  15. Step onto or off an escalator while holding onto parcels such that you cannot hold onto the railing 0%  16. Walk outside on icy sidewalks 0%  Total: #/16 23.75%                                                                                                                                 TREATMENT DATE: 10/13/24   TA- To improve functional movements patterns for everyday tasks   And TE- To improve strength, endurance, mobility, and function of specific targeted muscle  groups or improve joint range of motion or improve muscle flexibility  Gait will rollator to treatment chair x 30 ft   HS curl 2 x 10 ea LE GTB   Gait 100' withoout AD weaving between obstacles - close CGA  Seated LAQ 2 x 10   Gait 100' withoout AD weaving between obstacles - close CGA  Seated Heel raise with hold and 3# AW 2 x 30 reps   Gait 100' withoout AD weaving between obstacles - close CGA  Attempted step over hurdle with R LE but could not return to start position  -transitioned to step taps R LE only 2 x 10 - improved ability with this   Gait 100' withoout AD weaving between obstacles - close CGA  Pt required occasional rest breaks due fatigue, PT was attentive to when pt appeared to be tired or winded in order to prevent excessive fatigue.Instructed I pursed lip breathing for optimal benefit.   Unless otherwise stated, CGA was provided and gait belt donned in order to ensure pt safety   PATIENT EDUCATION: Education details:  POC, goals  Person educated: Patient Education method: Explanation, Demonstration, and Handouts Education comprehension: verbalized understanding and returned demonstration  HOME EXERCISE PROGRAM: Access Code: [email or web address] URL: [email or web address] Date: 09/25/2024 Prepared by: Sidra Simpers  Exercises - Standing Hip Abduction with Counter Support  - 1 x daily - 7 x weekly - 3 sets - 10 reps - Standing March with Counter Support  - 1 x daily - 7 x weekly - 3  sets - 10 reps - Standing Hip Extension with Counter Support  - 1 x daily - 7 x weekly - 3 sets - 10 reps - Standing 4-Way Leg Reach with Counter Support  - 1 x daily - 7 x weekly - 3 sets - 10 reps - Standing Knee Flexion with Counter Support  - 1 x daily - 7 x weekly - 3 sets - 10 reps  GOALS: Goals reviewed with patient? Yes  SHORT TERM GOALS: Target date: 10/31/2024  Patient will be independent in HEP to improve strength/mobility for better functional independence with  ADLs.  Baseline: Goal status: INITIAL   LONG TERM GOALS: Target date: 12/12/2024  Patient will increase ABC scale score >80% to demonstrate better functional mobility and better confidence with ADLs.   Baseline: 09/19/24: 23.75%  Goal status: INITIAL  2.  Patient (> 71 years old) will complete five times sit to stand test in < 15 seconds indicating an increased LE strength and improved balance.  Baseline: 09/19/24: 48.55 seconds with BUE support   Goal status: INITIAL  3.  Patient will improve by 27m (164') in order to demonstrate clinically significant improvement in cardiopulmonary endurance and community ambulation  Baseline: 09/19/24: 300' with SPC and several standing rest breaks Goal status: INITIAL  4.  Patient will increase Berg Balance score by > 6 points to demonstrate decreased fall risk during functional activities. Baseline: 09/19/24: 27/56 Goal status: INITIAL  5.  Patient will increase BLE gross strength to 4+/5 as to improve functional strength for independent gait, increased standing tolerance and increased ADL ability.  Baseline: 09/19/24: see chart above   Goal status: INITIAL  6.  Patient will reduce timed up and go to <11 seconds to reduce fall risk and demonstrate improved transfer/gait ability.  Baseline: 09/19/24: 23.35 seconds with use of SPC   Goal status: INITIAL  7. Patient will increase 10 meter walk test to >1.17m/s as to improve gait speed for better community ambulation and to reduce fall risk.   Baseline: 09/19/24: 0.41 m/s   Goal status: INITIAL   ASSESSMENT:  CLINICAL IMPRESSION:  The patient demonstrated a high level of motivation to improve throughout the entire treatment session. Todays session focused on enhancing balance, lower extremity strength, and overall activity tolerance. The patient fatigued quickly during therapeutic activities and exhibited shortness of breath, which required the use of additional rest breaks in order to  safely continue. Gait training was performed without an assistive device. The patients walking speed was slow but generally steady, which suggests that this may be an appropriate intervention to continue since it reflects how he currently ambulates at home. Overall, the patient tolerated the treatment well. He remains an appropriate and strong candidate for continued skilled physical therapy in order to progress toward his functional goals and improve his mobility and endurance.   OBJECTIVE IMPAIRMENTS: Abnormal gait, cardiopulmonary status limiting activity, decreased activity tolerance, decreased balance, decreased endurance, difficulty walking, and decreased strength.   ACTIVITY LIMITATIONS: bending, squatting, stairs, and locomotion level  PARTICIPATION LIMITATIONS: cleaning, laundry, community activity, and yard work  PERSONAL FACTORS: Age, Behavior pattern, Past/current experiences, and Time since onset of injury/illness/exacerbation are also affecting patient's functional outcome.   REHAB POTENTIAL: Good  CLINICAL DECISION MAKING: Evolving/moderate complexity  EVALUATION COMPLEXITY: Moderate  PLAN:  PT FREQUENCY: 1-2x/week  PT DURATION: 12 weeks  PLANNED INTERVENTIONS: 97164- PT Re-evaluation, 97750- Physical Performance Testing, 97110-Therapeutic exercises, 97530- Therapeutic activity, [id number]- Neuromuscular re-education, 97535- Self Care, [id number]- Manual therapy, 02883-  Gait training, Patient/Family education, Balance training, Stair training, Joint mobilization, Joint manipulation, DME instructions, Cryotherapy, and Moist heat  PLAN FOR NEXT SESSION:  dynamic/static balance training, endurance training, foot clearance practice    Note: Portions of this document were prepared using Dragon voice recognition software and although reviewed may contain unintentional dictation errors in syntax, grammar, or spelling.  Lonni KATHEE Gainer PT ,DPT Physical Therapist- [facility]    10/13/24, 7:56 AM    "

## 2024-10-12 NOTE — Progress Notes (Signed)
 [facility] New Consult Visit  Patient Name: Bryan Singleton, male   Patient DOB: 1932-01-13 Date of Service: 10/12/2024  Patient MRN: [id number] Provider Creating Note: Bryan Brought, MD  [phone] Primary Care Physician: Bryan Ophelia [name], MD  [address] [place] [address] Additional Physicians/ Providers:    Impression/Recommendations   [name] is a 89 y.o. male with hypertension, atrial fibrillation, COPD, coronary artery disease, gout, pacemaker and arthritis who presents as a new patient for evaluation of chronic kidney disease stage IIIA. Creatinine 1.2, GFR of 57.   Chronic Kidney Disease stage IIIA: with history of acute kidney injury. Secondary to hypertension, age and limited recovery after acute kidney injury - currently holding lisinopril .  - start spironolactone  - not currently on an SGLT-2 inhibitor - avoid nonsteroidal anti-inflammatory agents.   Hypertension with chronic kidney disease: with congestive heart failure. Volume overload on clinic encounter.  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, or unspecified   Hyperlipidemia   Chronic kidney disease stage 3B (HCC)   Anemia in chronic kidney disease    Orders Placed This Encounter  Procedures   CBC and Differential    Standing Status:   Future    Number of Occurrences:   1    Expected Date:   10/12/2024    Expiration Date:   11/12/2025   Kappa/Lambda free LT chains w/ratio, Serum    Standing Status:   Future    Number of Occurrences:   1    Expected Date:   10/12/2024    Expiration Date:   11/12/2025   Hepatitis B Core  Antibody, Total    Standing Status:   Future    Number of Occurrences:   1    Expected Date:   10/12/2024    Expiration Date:   11/12/2025   Hepatitis B Surface Antigen    Standing Status:   Future    Number of Occurrences:   1    Expected Date:   10/12/2024    Expiration Date:   11/12/2025   Hepatitis C antibody    Standing Status:   Future    Number of Occurrences:   1    Expected Date:   10/12/2024    Expiration Date:   11/12/2025   Magnesium    Standing Status:   Future    Number of Occurrences:   1    Expected Date:   10/12/2024    Expiration Date:   11/12/2025   Protein electrophoresis, serum    Standing Status:   Future    Number of Occurrences:   1    Expected Date:   10/12/2024    Expiration Date:   11/12/2025   Protein Electrophoresis, Urine Random    Standing Status:   Future    Number of Occurrences:   1    Expected Date:   10/12/2024    Expiration Date:   11/12/2025   PTH, Intact    Standing Status:   Future    Number of Occurrences:   1    Expected Date:   10/12/2024    Expiration Date:   11/12/2025   Renal Function Panel  Standing Status:   Future    Number of Occurrences:   1    Expected Date:   10/12/2024    Expiration Date:   11/12/2025   Protein, Total, Random Urine w/Creatinine (Protein/Creat Ratio)    Standing Status:   Future    Number of Occurrences:   1    Expected Date:   10/12/2024    Expiration Date:   11/12/2025   POCT Urinalysis Auto w Scope   Problem List Items Addressed This Visit     Hypertensive chronic kidney disease, benign, with chronic kidney disease stage I through stage IV, or unspecified   Hyperlipidemia   Chronic kidney disease stage 3B (HCC)   Anemia in chronic kidney disease (Chronic)   Other Visit Diagnoses       Abnormal urine    -  Primary      Orders Placed This Encounter   CBC and Differential   Kappa/Lambda free LT chains w/ratio, Serum   Hepatitis B Core Antibody, Total   Hepatitis B Surface Antigen    Hepatitis C antibody   Magnesium   Protein electrophoresis, serum   Protein Electrophoresis, Urine Random   PTH, Intact   Renal Function Panel   Protein, Total, Random Urine w/Creatinine (Protein/Creat Ratio)   POCT Urinalysis Auto w Scope   spironolactone  (Aldactone ) 25 MG tablet       Return in about 4 weeks (around 11/09/2024).   History of Present Illness   Chief Complaint  Patient presents with   New     [name] is a 89 y.o. 1-White male who is being evaluated as a new patient today for chronic kidney disease stage IIIA. Patient presents with his wife who assists with history taking.   Patient was admitted to [facility] in October 2025 for anemia. He underwent GI work up with no source of bleeding found. His kidney function did drop during this hospitalization. However back to baseline.   Patient's main complain is bilateral lower extremity swelling. Patient states he is taking furosemide  80mg  daily. However patient is drinking a great deal and having banana popsicles.   Patient is complaining of itching back.     The following portions of the patient's chart were reviewed in this encounter and updated as appropriate:  Tobacco  Allergies  Meds  Problems  Med Hx  Surg Hx  Fam Hx        Urine Studies   10/12/2024: urine microscopy: bland  History    Medications   Current Outpatient Medications:    albuterol  HFA (PROVENTIL  HFA;VENTOLIN  HFA) 108 (90 Base) MCG/ACT inhaler, Inhale 2 puffs every 6 (six) hours if needed for wheezing, Disp: , Rfl:    atorvastatin  (LIPITOR) 10 MG tablet, Take 10 mg by mouth in the morning., Disp: , Rfl:    ferrous sulfate  325 (65 Fe) MG EC tablet, Take 325 mg by mouth every 48 hours, Disp: , Rfl:    furosemide  (LASIX ) 40 MG tablet, Take 40 mg by mouth 1 (one) time each day, Disp: , Rfl:    levothyroxine  (SYNTHROID , LEVOTHROID) 25 MCG tablet, 1 TAB ONCE DAILY ON AN EMPTY STOMACH WITH A GLASS OF WATER AT LEAST 30-60  MINS BEFORE BREAKFAST., Disp: , Rfl:    metoprolol  succinate XL (TOPROL  XL) 25 MG 24 hr tablet, Take 25 mg by mouth in the morning., Disp: , Rfl:    Pramoxine HCl 1 % lotion, Apply topically 2 (two) times a day if needed, Disp: , Rfl:  triamcinolone (KENALOG) 0.1 % cream, Apply topically 2 (two) times daily as needed (itch), Disp: , Rfl:    Apoaequorin (Prevagen Extra Strength) 20 MG capsule, Take 20 mg by mouth 1 (one) time each day, Disp: , Rfl:    spironolactone  (Aldactone ) 25 MG tablet, Take 1 tablet (25 mg total) by mouth 1 (one) time each day, Disp: 30 tablet, Rfl: 11   Allergies Fexofenadine, Penicillins, Prednisone, Pregabalin, and Shellfish allergy  History Past Medical History:  Diagnosis Date   Anemia in chronic kidney disease    Angiodysplasia of gastrointestinal tract    Atrial fibrillation (HCC)    Chronic kidney disease stage 3B (HCC)    Chronic obstructive pulmonary disease (HCC)    Coronary atherosclerosis of unspecified type of vessel, native or graft    Gout    Hyperlipidemia    Hypertensive chronic kidney disease, benign, with chronic kidney disease stage I through stage IV, or unspecified    Nephrolithiasis    Osteoarthritis    Sick sinus syndrome (HCC)    TIA     Past Surgical History:  Procedure Laterality Date   CARDIAC PACEMAKER PLACEMENT     CHOLECYSTECTOMY     CORONARY ARTERY BYPASS GRAFT     VASECTOMY     History reviewed. No pertinent family history. Social History   Tobacco Use   Smoking status: Never   Smokeless tobacco: Never  Substance Use Topics   Alcohol use: Not Currently        Physical Exam  Vitals BP 105/73 (BP Location: Left upper arm, Patient Position: Sitting)   Pulse 62   Temp 97.7 F   Ht 6' 2 (1.88 m)   Wt 200 lb (90.7 kg)   SpO2 98%   BMI 25.68 kg/m   Vitals reviewed. Constitutional: He is oriented to person, place, and time. Vital signs are normal. He appears well-developed and  wheelchair bound.  HEENT:  Head: Normocephalic and atraumatic. Mouth/Throat: Oropharynx is clear and moist.  Eyes: Pupils are equal, round, and reactive to light.  Neck: Neck supple.  Cardiovascular:  Normal rate and regular rhythm.          He exhibits edema.  Pulmonary/Chest: Effort normal and breath sounds normal.  Abdominal: Soft.  Neurological: He is alert and oriented to person, place, and time.  Skin: Skin is warm and dry.  Back with actinic keratosis +     Laboratory Studies  Chemistry  Lab Units 10/09/24 1522 09/25/24 1214 08/21/24 1057 08/17/24 1004 08/14/24 1128 07/14/24 1124 06/28/24 1151 04/27/24 1217 12/20/23 0908 09/01/23 1433 03/08/23 0955 12/18/22 0919  SODIUM mmol/L 141 140 137 136 138 139 142 142 141   < > 140 141  POTASSIUM mmol/L 4.5 3.8 3.2* 2.9* 2.9* 4.4 3.8 4.2 4.1   < > 4.2 4.3  CHLORIDE mmol/L 102 100 83* 81* 83* 105 105 101 103   < > 102 106  CO2 mmol/L 33.8* 35.6* 41.4* 41.4* 42.2* 29.0 30.9 34.1* 27.9   < > 28.0 28.7  ANION GAP   --   --  15.8 16.5*  --   --   --   --   --   --   --   --   MAGNESIUM mg/dL  --   --   --  2.1  --   --   --   --   --   --   --   --   CALCIUM  mg/dL 8.6* 8.7 9.7 89.9 89.9 8.2* 9.2  9.4 9.3   < > 9.3 8.7  PHOSPHORUS mg/dL  --   --   --   --   --   --   --   --   --   --  3.8  --   ALK PHOS U/L 82 84  --   --  92 70 68 94 84   < > 71 75  GLUCOSE mg/dL 878* 896 857* 800* 837* 130* 90 102 104   < > 103 100  ALBUMIN g/dL 3.5 3.6  --   --  4.2 3.6 3.9 3.8 4.1   < > 4.2 3.8  BUN mg/dL 32* 37* 67* 61* 64* 22 39* 27* 27*   < > 35* 30*  CREATININE mg/dL 1.2 1.3 1.7* 1.6* 1.7* 1.0 1.2 1.1 1.0   < > 1.0 1.2  HEMOGLOBIN A1C %  --   --   --   --   --   --  6.5*  --  6.6*  --  6.4* 6.6*   < > = values in this interval not displayed.    Iron Studies  Lab Units 08/17/24 1004 09/12/24 1128 03/08/23 0955 01/25/23 0853 12/18/22 0919  IRON ug/dL 39* 42* 747* 826 27*  FERRITIN ng/mL 28 26 29 29  18*  TIBC ug/dL  --   --   --   566.7  --   IRON SATURATION %  --   --   --  40  --         Urine  Lab Units 10/12/24 1134 09/25/24 1214 06/28/24 1151 12/20/23 0908 03/08/23 0955 12/18/22 0919  COLOR UA  Yellow  --   --   --   --   --   CLARITY UA  Clear  --   --   --   --   --   KETONES UA  Negative  --   --   --   --   --   PH UA  5.0  --   --   --   --   --   UROBILINOGEN UA  0.2  --   --   --   --   --   ALB MG/G CREAT UR ug/mg  --  41.4* 52.2* 59.3* 97.6* 130.3*        Bryan Brought, MD

## 2024-10-14 ENCOUNTER — Inpatient Hospital Stay
Admission: EM | Admit: 2024-10-14 | Discharge: 2024-10-17 | DRG: 064 | Disposition: A | Discharge status: Home / Self Care | Attending: Internal Medicine | Admitting: Internal Medicine

## 2024-10-14 DIAGNOSIS — E785 Hyperlipidemia, unspecified: Secondary | ICD-10-CM | POA: Diagnosis present

## 2024-10-14 DIAGNOSIS — R29701 NIHSS score 1: Secondary | ICD-10-CM | POA: Diagnosis present

## 2024-10-14 DIAGNOSIS — I639 Cerebral infarction, unspecified: Secondary | ICD-10-CM | POA: Diagnosis not present

## 2024-10-14 DIAGNOSIS — J90 Pleural effusion, not elsewhere classified: Secondary | ICD-10-CM

## 2024-10-14 DIAGNOSIS — E119 Type 2 diabetes mellitus without complications: Secondary | ICD-10-CM

## 2024-10-14 DIAGNOSIS — Z7901 Long term (current) use of anticoagulants: Secondary | ICD-10-CM

## 2024-10-14 DIAGNOSIS — I5033 Acute on chronic diastolic (congestive) heart failure: Secondary | ICD-10-CM | POA: Diagnosis present

## 2024-10-14 DIAGNOSIS — Z87891 Personal history of nicotine dependence: Secondary | ICD-10-CM

## 2024-10-14 DIAGNOSIS — J918 Pleural effusion in other conditions classified elsewhere: Secondary | ICD-10-CM | POA: Diagnosis present

## 2024-10-14 DIAGNOSIS — Z79899 Other long term (current) drug therapy: Secondary | ICD-10-CM

## 2024-10-14 DIAGNOSIS — I77810 Thoracic aortic ectasia: Secondary | ICD-10-CM | POA: Diagnosis present

## 2024-10-14 DIAGNOSIS — Z79890 Hormone replacement therapy (postmenopausal): Secondary | ICD-10-CM

## 2024-10-14 DIAGNOSIS — N189 Chronic kidney disease, unspecified: Secondary | ICD-10-CM | POA: Diagnosis present

## 2024-10-14 DIAGNOSIS — Z9889 Other specified postprocedural states: Secondary | ICD-10-CM

## 2024-10-14 DIAGNOSIS — I480 Paroxysmal atrial fibrillation: Secondary | ICD-10-CM | POA: Diagnosis present

## 2024-10-14 DIAGNOSIS — I509 Heart failure, unspecified: Secondary | ICD-10-CM | POA: Diagnosis not present

## 2024-10-14 DIAGNOSIS — E1122 Type 2 diabetes mellitus with diabetic chronic kidney disease: Secondary | ICD-10-CM | POA: Diagnosis present

## 2024-10-14 DIAGNOSIS — I130 Hypertensive heart and chronic kidney disease with heart failure and stage 1 through stage 4 chronic kidney disease, or unspecified chronic kidney disease: Secondary | ICD-10-CM | POA: Diagnosis present

## 2024-10-14 DIAGNOSIS — I6340 Cerebral infarction due to embolism of unspecified cerebral artery: Secondary | ICD-10-CM | POA: Diagnosis present

## 2024-10-14 DIAGNOSIS — J439 Emphysema, unspecified: Secondary | ICD-10-CM | POA: Diagnosis present

## 2024-10-14 DIAGNOSIS — R55 Syncope and collapse: Principal | ICD-10-CM

## 2024-10-14 DIAGNOSIS — I495 Sick sinus syndrome: Secondary | ICD-10-CM | POA: Diagnosis present

## 2024-10-14 DIAGNOSIS — Z950 Presence of cardiac pacemaker: Secondary | ICD-10-CM | POA: Diagnosis not present

## 2024-10-14 DIAGNOSIS — Z888 Allergy status to other drugs, medicaments and biological substances status: Secondary | ICD-10-CM

## 2024-10-14 DIAGNOSIS — R569 Unspecified convulsions: Secondary | ICD-10-CM | POA: Diagnosis not present

## 2024-10-14 DIAGNOSIS — I2510 Atherosclerotic heart disease of native coronary artery without angina pectoris: Secondary | ICD-10-CM | POA: Diagnosis present

## 2024-10-14 DIAGNOSIS — Z8673 Personal history of transient ischemic attack (TIA), and cerebral infarction without residual deficits: Secondary | ICD-10-CM | POA: Diagnosis not present

## 2024-10-14 DIAGNOSIS — Z66 Do not resuscitate: Secondary | ICD-10-CM | POA: Diagnosis present

## 2024-10-14 DIAGNOSIS — D631 Anemia in chronic kidney disease: Secondary | ICD-10-CM | POA: Diagnosis present

## 2024-10-14 DIAGNOSIS — Z91013 Allergy to seafood: Secondary | ICD-10-CM

## 2024-10-14 DIAGNOSIS — Z1152 Encounter for screening for COVID-19: Secondary | ICD-10-CM

## 2024-10-14 DIAGNOSIS — E039 Hypothyroidism, unspecified: Secondary | ICD-10-CM | POA: Diagnosis present

## 2024-10-14 DIAGNOSIS — J9811 Atelectasis: Secondary | ICD-10-CM | POA: Diagnosis present

## 2024-10-14 DIAGNOSIS — Z880 Allergy status to penicillin: Secondary | ICD-10-CM

## 2024-10-14 HISTORY — DX: Emphysema, unspecified: J43.9

## 2024-10-14 HISTORY — DX: Disorder of kidney and ureter, unspecified: N28.9

## 2024-10-14 HISTORY — DX: Cerebral infarction, unspecified: I63.9

## 2024-10-14 HISTORY — DX: Essential (primary) hypertension: I10

## 2024-10-14 MED ADMIN — Furosemide Inj 10 MG/ML: 40 mg | INTRAVENOUS | NDC 25021032004

## 2024-10-14 MED ADMIN — Iohexol IV Soln 350 MG/ML: 75 mL | INTRAVENOUS | NDC 00407141490

## 2024-10-14 MED FILL — Insulin Aspart Inj Soln 100 Unit/ML: 0.0000 [IU] | INTRAMUSCULAR | Qty: 1 | Status: AC

## 2024-10-14 MED FILL — Metoprolol Succinate Tab ER 24HR 25 MG (Tartrate Equiv): 25.0000 mg | ORAL | Qty: 1 | Status: AC

## 2024-10-15 DIAGNOSIS — I480 Paroxysmal atrial fibrillation: Secondary | ICD-10-CM

## 2024-10-15 DIAGNOSIS — I5033 Acute on chronic diastolic (congestive) heart failure: Secondary | ICD-10-CM | POA: Diagnosis not present

## 2024-10-15 DIAGNOSIS — E039 Hypothyroidism, unspecified: Secondary | ICD-10-CM

## 2024-10-15 DIAGNOSIS — I509 Heart failure, unspecified: Secondary | ICD-10-CM | POA: Diagnosis not present

## 2024-10-15 MED ADMIN — Apixaban Tab 5 MG: 5 mg | ORAL | NDC 00003089431

## 2024-10-15 MED ADMIN — Furosemide Inj 10 MG/ML: 60 mg | INTRAVENOUS | NDC 25021032004

## 2024-10-15 MED ADMIN — Furosemide Inj 10 MG/ML: 60 mg | INTRAVENOUS | NDC 71288020302

## 2024-10-15 MED ADMIN — Metoprolol Succinate Tab ER 24HR 25 MG (Tartrate Equiv): 25 mg | ORAL | NDC 50268054111

## 2024-10-15 MED ADMIN — Enoxaparin Sodium Inj Soln Pref Syr 40 MG/0.4ML: 40 mg | SUBCUTANEOUS | NDC 00781324602

## 2024-10-15 MED FILL — Levothyroxine Sodium Tab 25 MCG: 25.0000 ug | ORAL | Qty: 1 | Status: AC

## 2024-10-16 DIAGNOSIS — Z950 Presence of cardiac pacemaker: Secondary | ICD-10-CM

## 2024-10-16 DIAGNOSIS — E119 Type 2 diabetes mellitus without complications: Secondary | ICD-10-CM | POA: Diagnosis not present

## 2024-10-16 DIAGNOSIS — I509 Heart failure, unspecified: Secondary | ICD-10-CM | POA: Diagnosis not present

## 2024-10-16 DIAGNOSIS — I5033 Acute on chronic diastolic (congestive) heart failure: Secondary | ICD-10-CM | POA: Diagnosis not present

## 2024-10-16 DIAGNOSIS — I6340 Cerebral infarction due to embolism of unspecified cerebral artery: Secondary | ICD-10-CM | POA: Diagnosis not present

## 2024-10-16 DIAGNOSIS — I480 Paroxysmal atrial fibrillation: Secondary | ICD-10-CM | POA: Diagnosis not present

## 2024-10-16 DIAGNOSIS — E039 Hypothyroidism, unspecified: Secondary | ICD-10-CM | POA: Diagnosis not present

## 2024-10-16 MED ADMIN — Gadobutrol Inj 1 MMOL/ML (604.72 MG/ML): 9 mL | INTRAVENOUS | NDC 50419032512

## 2024-10-16 MED ADMIN — Furosemide Inj 10 MG/ML: 60 mg | INTRAVENOUS | NDC 71288020302

## 2024-10-16 MED ADMIN — Metoprolol Succinate Tab ER 24HR 25 MG (Tartrate Equiv): 25 mg | ORAL | NDC 72516003001

## 2024-10-16 MED ADMIN — Atorvastatin Calcium Tab 10 MG (Base Equivalent): 10 mg | ORAL | NDC 75834025501

## 2024-10-16 MED ADMIN — Insulin Aspart Inj Soln 100 Unit/ML: 1 [IU] | SUBCUTANEOUS | NDC 73070010011

## 2024-10-16 MED ADMIN — Spironolactone Tab 25 MG: 25 mg | ORAL | NDC 60687046511

## 2024-10-16 MED ADMIN — Levothyroxine Sodium Tab 25 MCG: 25 ug | ORAL | NDC 60687045311

## 2024-10-16 MED ADMIN — Enoxaparin Sodium Inj Soln Pref Syr 40 MG/0.4ML: 40 mg | SUBCUTANEOUS | NDC 00781324602

## 2024-10-16 MED ADMIN — Lidocaine HCl Local Preservative Free (PF) Inj 1%: 10 mL | INTRADERMAL | NDC 63323049297

## 2024-10-16 MED FILL — Spironolactone Tab 25 MG: 25.0000 mg | ORAL | Qty: 1 | Status: AC

## 2024-10-16 MED FILL — Atorvastatin Calcium Tab 10 MG (Base Equivalent): 10.0000 mg | ORAL | Qty: 1 | Status: AC

## 2024-10-16 NOTE — Progress Notes (Addendum)
 [facility] CLINIC CARDIOLOGY PROGRESS NOTE   Patient ID: Bryan Singleton MRN: [id number] DOB/AGE: 1932-07-15 89 y.o.  Admit date: 10/14/2024 Referring Physician Dr. Cresencio Fairly  Primary Physician Fernande Ophelia [name], MD  Primary Cardiologist Dr. Florencio Reason for Consultation AoCHF  HPI: Bryan Singleton is a 89 y.o. male with a past medical history of chronic heart failure, paroxysmal atrial fibrillation, CKD, COPD, sick sinus syndrome with Medtronic pacemaker who presented to the ED on 10/14/2024 for syncope. Concern for acute heart failure. Cardiology was consulted for further evaluation.   Interval History:  -Patient seen and examined this AM, laying in bed getting echo.  -States SOB overall improved as well as LE edema.  -BP and HR stable, no events on tele.   Review of systems complete and found to be negative unless listed above   Vitals:   10/16/24 0115 10/16/24 0500 10/16/24 0509 10/16/24 0908  BP: 119/70  126/75 132/78  Pulse: 62  (!) 58 70  Resp: 18  20 20   Temp: 97.9 F (36.6 C)  97.9 F (36.6 C) 98.2 F (36.8 C)  TempSrc:    Axillary  SpO2: 97%  96% 99%  Weight:  93.9 kg    Height:         Intake/Output Summary (Last 24 hours) at 10/16/2024 1021 Last data filed at 10/16/2024 0934 Gross per 24 hour  Intake 200 ml  Output 1500 ml  Net -1300 ml     PHYSICAL EXAM General: Chronically ill appearing elderly male, well nourished, in no acute distress. HEENT: Normocephalic and atraumatic. Neck: No JVD.  Lungs: Normal respiratory effort on room air. Clear bilaterally to auscultation. No wheezes, crackles, rhonchi.  Heart: HRRR. Normal S1 and S2 without gallops or murmurs. Radial & DP pulses 2+ bilaterally. Abdomen: Non-distended appearing.  Msk: Normal strength and tone for age. Extremities: No clubbing, cyanosis or edema.   Neuro: Alert and oriented X 3. Psych: Mood appropriate, affect congruent.    LABS: Basic Metabolic Panel: Recent Labs     10/15/24 0537 10/16/24 0433  NA 140 142  K 3.9 3.8  CL 103 101  CO2 28 30  GLUCOSE 100* 96  BUN 25* 27*  CREATININE 1.31* 1.23  CALCIUM  8.9 8.7*   Liver Function Tests: Recent Labs    10/14/24 1831 10/15/24 0537  AST 34 41  ALT 13 11  ALKPHOS 90 83  BILITOT 1.2 1.1  PROT 6.9 6.5  ALBUMIN 3.5 3.3*   No results for input(s): LIPASE, AMYLASE in the last 72 hours. CBC: Recent Labs    10/15/24 0537 10/16/24 0433  WBC 6.6 6.7  HGB 10.5* 11.0*  HCT 34.3* 36.1*  MCV 88.4 88.9  PLT 182 206   Cardiac Enzymes: No results for input(s): CKTOTAL, CKMB, CKMBINDEX, TROPONINIHS in the last 72 hours. BNP: No results for input(s): BNP in the last 72 hours. D-Dimer: Recent Labs    10/14/24 1831  DDIMER 3.06*   Hemoglobin A1C: Recent Labs    10/15/24 0537  HGBA1C 6.3*   Fasting Lipid Panel: No results for input(s): CHOL, HDL, LDLCALC, TRIG, CHOLHDL, LDLDIRECT in the last 72 hours. Thyroid  Function Tests: No results for input(s): TSH, T4TOTAL, T3FREE, THYROIDAB in the last 72 hours.  Invalid input(s): FREET3 Anemia Panel: No results for input(s): VITAMINB12, FOLATE, FERRITIN, TIBC, IRON, RETICCTPCT in the last 72 hours.  CT Angio Chest Pulmonary Embolism (PE) W or WO Contrast Result Date: 10/14/2024 EXAM: CTA CHEST 10/14/2024 08:56:03 PM TECHNIQUE: CTA of  the chest was performed without and with the administration of 75 mL of iohexol  (OMNIPAQUE ) 350 MG/ML intravenous contrast. Multiplanar reformatted images are provided for review. MIP images are provided for review. Automated exposure control, iterative reconstruction, and/or weight based adjustment of the mA/kV was utilized to reduce the radiation dose to as low as reasonably achievable. COMPARISON: Chest x-ray from earlier in the same day. CLINICAL HISTORY: Pulmonary embolism (PE) suspected, low to intermediate prob, positive D-dimer. Dyspnea and weakness, initial encounter.  FINDINGS: PULMONARY ARTERIES: No acute pulmonary embolus. Main pulmonary artery is normal in caliber. The pulmonary artery shows a normal branching pattern bilaterally. No filling defect to suggest pulmonary embolism is noted. MEDIASTINUM: The heart and pericardium demonstrate no acute abnormality. Changes of prior coronary bypass grafting are seen. A pacing device is noted on the left. Atherosclerotic calcifications of the aorta are noted. Mild dilatation of the ascending aorta of 4.1 cm is noted. No dissection is seen. The esophagus, as visualized, is within normal limits. LYMPH NODES: No mediastinal, hilar or axillary lymphadenopathy. LUNGS AND PLEURA: Large left-sided pleural effusion is noted. Small right-sided effusion is seen. Some associated atelectatic changes are noted in the bases. No parenchymal nodules are seen. No pneumothorax. UPPER ABDOMEN: Reflux of contrast into the IVC and hepatic veins is noted. The IVC is prominent. This may be related to some underlying right heart failure. Visualized upper abdomen shows mild ascites. The gallbladder has been surgically removed. SOFT TISSUES AND BONES: Extensive venous collateral flow is noted in the left chest wall and neck. The bony structures are within normal limits. IMPRESSION: 1. No evidence of pulmonary embolism. 2. Large left-sided pleural effusion and small right-sided effusion with associated bibasilar atelectasis. 3. Reflux of contrast into the IVC and hepatic veins with a prominent IVC, a pattern that can be seen with right heart failure. 4. Extensive venous collateral flow in the left chest wall and neck. 5. Mild dilatation of the ascending aorta measuring 4.1 cm.Recommend annual imaging followup by CTA or MRA. This recommendation follows 2010 ACCF/AHA/AATS/ACR/ASA/SCA/SCAI/SIR/STS/SVM Guidelines for the Diagnosis and Management of Patients with Thoracic Aortic Disease. Circulation. 2010; 121: Z733-z630. Aortic aneurysm NOS (ICD10-I71.9)  Electronically signed by: Bryan Devonshire MD 10/14/2024 09:06 PM EST RP Workstation: [id number]   CT Head Wo Contrast Result Date: 10/14/2024 EXAM: CT HEAD WITHOUT CONTRAST 10/14/2024 08:18:46 PM TECHNIQUE: CT of the head was performed without the administration of intravenous contrast. Automated exposure control, iterative reconstruction, and/or weight based adjustment of the mA/kV was utilized to reduce the radiation dose to as low as reasonably achievable. COMPARISON: Comparison study 07/05/2024. CLINICAL HISTORY: Syncope/presyncope, cerebrovascular cause suspected. FINDINGS: BRAIN AND VENTRICLES: Remote left basal ganglia lacunar infarct. No acute hemorrhage. No evidence of acute infarct. No extra-axial collection. No mass effect or midline shift. ORBITS: No acute abnormality. SINUSES: No acute abnormality. SOFT TISSUES AND SKULL: No acute soft tissue abnormality. No skull fracture. IMPRESSION: 1. No acute intracranial abnormality. 2. Remote left basal ganglia lacunar infarct. Electronically signed by: Bryan Devonshire MD 10/14/2024 08:21 PM EST RP Workstation: [id number]   DG Chest 2 View Result Date: 10/14/2024 EXAM: 2 VIEW(S) XRAY OF THE CHEST 10/14/2024 07:43:37 PM COMPARISON: 07/06/2024 CLINICAL HISTORY: dyspnea FINDINGS: LINES, TUBES AND DEVICES: Left chest cardiac pacing device noted. LUNGS AND PLEURA: Small left pleural effusion. Left basilar airspace opacity, likely atelectasis. No pneumothorax. HEART AND MEDIASTINUM: Cardiomegaly. Status post median sternotomy and CABG. Atherosclerotic aortic calcifications. BONES AND SOFT TISSUES: Multilevel degenerative changes of thoracic spine. IMPRESSION: 1. Small left pleural  effusion with left basilar atelectasis. Electronically signed by: Bryan Pebbles MD 10/14/2024 07:46 PM EST RP Workstation: [id number]     ECHO pending  TELEMETRY (personally reviewed): ventricular pacing rate 70s  EKG (personally reviewed): ventricular pacing rate 76 bpm  DATA  reviewed by me 10/16/24: last 24h vitals tele labs imaging I/O, hospitalist progress note  Principal Problem:   Acute on chronic diastolic CHF (congestive heart failure) (HCC) Active Problems:   DM (diabetes mellitus), type 2 (HCC)   Paroxysmal A-fib (HCC)   Hypothyroidism    ASSESSMENT AND PLAN: Bryan Singleton is a 89 y.o. male with a past medical history of chronic heart failure, paroxysmal atrial fibrillation, CKD, COPD, sick sinus syndrome with Medtronic pacemaker who presented to the ED on 10/14/2024 for syncope. Concern for acute heart failure. Cardiology was consulted for further evaluation.   # Acute on chronic heart failure # SSS s/p PPM # Syncope # Paroxysmal atrial fibrillation Patient presented after syncopal events. CXR with pleural effusion and LE edema noted on exam. Started on IV lasix  with good UOP.  -Echo pending, further recommendations pending these results.  -Continue IV lasix , transition to PO lasix  60 mg daily on discharge.  -Resume home spironolactone  25 mg daily. Continue metoprolol  succinate 25 mg daily.  -Per daughter patient no longer on eliquis  for stroke risk reduction 2/2 AF.  -Resume home atorvastatin  10 mg daily.   This patient's case was discussed and created with Dr. Florencio and he is in agreement.  Signed:  Danita Bloch, PA-C  10/16/2024, 10:21 AM [facility] Cardiology

## 2024-10-16 NOTE — Consult Note (Signed)
 "                                                                                   Consultation Note Date: 10/16/2024   Patient Name: Bryan Singleton  DOB: 10-28-1931  MRN: [id number]  Age / Sex: 89 y.o., male  PCP: Fernande Ophelia [name], MD Referring Physician: Maree Hue, MD  Reason for Consultation: Establishing goals of care  HPI/Patient Profile: Patient is a 89 year old man with a history of heart failure, COPD, type 2 diabetes, CAD, paroxysmal A-fib, sick sinus syndrome with Medtronic pacemaker who presented to the ED after an episode at home and which he tried to get up from his chair and had a brief loss of consciousness in which his eyes rolled back in his head and speech was mumbling.  This lasted a short time before he returned to baseline. Did not fall. Recently had his furosemide  dosing changed, and per daughter started spironolactone .  Per wife, this provider stopped Eliquis .  Clinical Assessment and Goals of Care: Notes and labs reviewed.  In to see patient.  Patient states he was advised that he would be going home.  Wife is at bedside.  She states if he is discharging she has questions.  They discussed that they have been married for 67 years and have children.  Discusses that at baseline he uses a cane or walker when in open spaces.  He states around the home he is able to wall walk.  He discusses he has neuropathy in his feet from when he was in the [employer].  He discusses having had a stroke that also affects his right foot.  Wife discusses that his edema has worsened over the past couple of months and shortness of breath has been stable.  Wife had a phone call PMT left room.  Spoke with attending for updates.    Returned to bedside to confirm plans for further testing and a procedure today.  Daughter is at bedside.  Patient has been taking for testing.  Wife discusses that patient has poor short-term memory, but has long-term memory intact.  She states she is unsure if he has  dementia as they were told the memory changes occurred due to his stroke.  She states his memory deficits started at least 4 months after the stroke.  Discussed his current status, and ongoing discussions depending on outcomes of testing.  Questions answered as able.   SUMMARY OF RECOMMENDATIONS   Continue current care Wife will need to be called with updates or planning, as patient does has poor short-term memory.       Primary Diagnoses: Present on Admission:  Acute on chronic diastolic CHF (congestive heart failure) (HCC)   I have reviewed the medical record, interviewed the patient and family, and examined the patient. The following aspects are pertinent.  Past Medical History:  Diagnosis Date   Emphysema of lung (HCC)    Hypertension    Renal disorder    Stroke [facility])    Social History   Socioeconomic History   Marital status: Married    Spouse name: Not on file   Number of children: Not on file  Years of education: Not on file   Highest education level: Not on file  Occupational History   Not on file  Tobacco Use   Smoking status: Former    Types: Cigarettes   Smokeless tobacco: Never  Substance and Sexual Activity   Alcohol use: Not on file   Drug use: Not on file   Sexual activity: Not on file  Other Topics Concern   Not on file  Social History Narrative   Not on file   Social Drivers of Health   Tobacco Use: Medium Risk (10/15/2024)   Patient History    Smoking Tobacco Use: Former    Smokeless Tobacco Use: Never    Passive Exposure: Not on Actuary Strain: Not on file  Food Insecurity: No Food Insecurity (10/15/2024)   Epic    Worried About Programme Researcher, Broadcasting/film/video in the Last Year: Never true    Ran Out of Food in the Last Year: Never true  Transportation Needs: No Transportation Needs (10/15/2024)   Epic    Lack of Transportation (Medical): No    Lack of Transportation (Non-Medical): No  Physical Activity: Not on file  Stress: Not on  file  Social Connections: Socially Integrated (10/15/2024)   Social Connection and Isolation Panel    Frequency of Communication with Friends and Family: More than three times a week    Frequency of Social Gatherings with Friends and Family: More than three times a week    Attends Religious Services: More than 4 times per year    Active Member of [employer] or Organizations: Yes    Attends Banker Meetings: More than 4 times per year    Marital Status: Married  Depression (EYV7-0): Not on file  Alcohol Screen: Not on file  Housing: Low Risk (10/15/2024)   Epic    Unable to Pay for Housing in the Last Year: No    Number of Times Moved in the Last Year: 0    Homeless in the Last Year: No  Utilities: Not At Risk (10/15/2024)   Epic    Threatened with loss of utilities: No  Health Literacy: Not on file   History reviewed. No pertinent family history. Scheduled Meds:  atorvastatin   10 mg Oral Daily   enoxaparin  (LOVENOX ) injection  40 mg Subcutaneous QHS   furosemide   60 mg Intravenous BID   insulin  aspart  0-9 Units Subcutaneous TID WC   levothyroxine   25 mcg Oral Q0600   metoprolol  succinate  25 mg Oral Daily   spironolactone   25 mg Oral Daily   Continuous Infusions: PRN Meds:.acetaminophen  **OR** acetaminophen , ondansetron  **OR** ondansetron  (ZOFRAN ) IV, oxyCODONE , senna-docusate Medications Prior to Admission:  Prior to Admission medications  Medication Sig Start Date End Date Taking? Authorizing Provider  albuterol  (VENTOLIN  HFA) 108 (90 Base) MCG/ACT inhaler Inhale 2 puffs into the lungs every 6 (six) hours as needed for wheezing or shortness of breath.   Yes [provider]  Apoaequorin (PREVAGEN EXTRA STRENGTH) 20 MG CAPS Take 20 mg by mouth daily.   Yes [provider]  atorvastatin  (LIPITOR) 10 MG tablet Take 10 mg by mouth daily. 08/09/24  Yes [provider]  ferrous sulfate  325 (65 FE) MG EC tablet Take 1 tablet by mouth every other day.  07/11/24  Yes [provider]  furosemide  (LASIX ) 40 MG tablet Take 40 mg by mouth daily. 09/27/24  Yes [provider]  levothyroxine  (SYNTHROID ) 25 MCG tablet Take 25 mcg by mouth  every morning. 08/13/24  Yes [provider]  metoprolol  succinate (TOPROL -XL) 25 MG 24 hr tablet Take 25 mg by mouth daily. 08/14/24  Yes [provider]  spironolactone  (ALDACTONE ) 25 MG tablet Take 25 mg by mouth daily.   Yes [provider]  triamcinolone cream (KENALOG) 0.1 % Apply 1 Application topically 2 (two) times daily as needed. 06/28/24  Yes [provider]   Allergies[1] Review of Systems  All other systems reviewed and are negative.   Physical Exam Pulmonary:     Effort: Pulmonary effort is normal.  Musculoskeletal:     Right lower leg: Edema present.     Left lower leg: Edema present.  Skin:    General: Skin is warm and dry.  Neurological:     Mental Status: He is alert.     Vital Signs: BP 131/65 (BP Location: Left Arm)   Pulse 79   Temp 98.2 F (36.8 C) (Axillary)   Resp 20   Ht 6' 2 (1.88 m)   Wt 93.9 kg   SpO2 95%   BMI 26.58 kg/m  Pain Scale: 0-10   Pain Score: 0-No pain   SpO2: SpO2: 95 % O2 Device:SpO2: 95 % O2 Flow Rate: .   IO: Intake/output summary:  Intake/Output Summary (Last 24 hours) at 10/16/2024 1211 Last data filed at 10/16/2024 1022 Gross per 24 hour  Intake 320 ml  Output 1500 ml  Net -1180 ml    LBM: Last BM Date : 10/15/24 (per pt) Baseline Weight: Weight: 92.1 kg Most recent weight: Weight: 93.9 kg       Time In: 10:00 Time Out: 11:00 Time Total: 60 min Greater than 50%  of this time was spent counseling and coordinating care related to the above assessment and plan.  Signed by: Camelia Lewis, NP   Please contact Palliative Medicine Team phone at [phone] for questions and concerns.  For individual provider: See Amion                 [1]  Allergies Allergen  Reactions   Penicillins    "

## 2024-10-17 ENCOUNTER — Ambulatory Visit: Admitting: Speech Pathology

## 2024-10-17 ENCOUNTER — Ambulatory Visit

## 2024-10-17 ENCOUNTER — Other Ambulatory Visit: Payer: Self-pay

## 2024-10-17 DIAGNOSIS — R569 Unspecified convulsions: Secondary | ICD-10-CM | POA: Diagnosis not present

## 2024-10-17 DIAGNOSIS — J90 Pleural effusion, not elsewhere classified: Secondary | ICD-10-CM | POA: Diagnosis not present

## 2024-10-17 DIAGNOSIS — I639 Cerebral infarction, unspecified: Secondary | ICD-10-CM

## 2024-10-17 DIAGNOSIS — R55 Syncope and collapse: Secondary | ICD-10-CM | POA: Diagnosis not present

## 2024-10-17 DIAGNOSIS — I5033 Acute on chronic diastolic (congestive) heart failure: Secondary | ICD-10-CM | POA: Diagnosis not present

## 2024-10-17 LAB — BASIC METABOLIC PANEL WITH GFR
Anion gap: 11 (ref 5–15)
BUN: 31 mg/dL — ABNORMAL HIGH (ref 8–23)
CO2: 30 mmol/L (ref 22–32)
Calcium: 8.7 mg/dL — ABNORMAL LOW (ref 8.9–10.3)
Chloride: 99 mmol/L (ref 98–111)
Creatinine, Ser: 1.27 mg/dL — ABNORMAL HIGH (ref 0.61–1.24)
GFR, Estimated: 54 mL/min — ABNORMAL LOW
Glucose, Bld: 91 mg/dL (ref 70–99)
Potassium: 4.2 mmol/L (ref 3.5–5.1)
Sodium: 140 mmol/L (ref 135–145)

## 2024-10-17 LAB — PROTIME-INR
INR: 1.4 — ABNORMAL HIGH (ref 0.8–1.2)
Prothrombin Time: 18.2 s — ABNORMAL HIGH (ref 11.4–15.2)

## 2024-10-17 LAB — MISC LABCORP TEST (SEND OUT)
Labcorp test code: 100156
Labcorp test code: 19497
Labcorp test code: 19588

## 2024-10-17 LAB — THYROID PANEL WITH TSH
Free Thyroxine Index: 1.9 (ref 1.2–4.9)
T3 Uptake Ratio: 35 % (ref 24–39)
T4, Total: 5.4 ug/dL (ref 4.5–12.0)
TSH: 3.05 u[IU]/mL (ref 0.450–4.500)

## 2024-10-17 LAB — GLUCOSE, CAPILLARY
Glucose-Capillary: 93 mg/dL (ref 70–99)
Glucose-Capillary: 148 mg/dL — ABNORMAL HIGH (ref 70–99)

## 2024-10-17 MED ORDER — FUROSEMIDE 40 MG PO TABS
60.0000 mg | ORAL_TABLET | Freq: Every day | ORAL | Status: DC
  Administered 2024-10-17: 60 mg via ORAL
  Filled 2024-10-17: qty 1

## 2024-10-17 MED ORDER — FUROSEMIDE 20 MG PO TABS
60.0000 mg | ORAL_TABLET | Freq: Every day | ORAL | 0 refills | Status: AC
  Filled 2024-10-17: qty 30, 10d supply, fill #0

## 2024-10-17 MED ORDER — APIXABAN 5 MG PO TABS
5.0000 mg | ORAL_TABLET | Freq: Two times a day (BID) | ORAL | 0 refills | Status: AC
  Filled 2024-10-17: qty 60, 30d supply, fill #0

## 2024-10-17 MED ADMIN — Metoprolol Succinate Tab ER 24HR 25 MG (Tartrate Equiv): 25 mg | ORAL | NDC 72516003001

## 2024-10-17 MED ADMIN — Atorvastatin Calcium Tab 10 MG (Base Equivalent): 10 mg | ORAL | NDC 75834025501

## 2024-10-17 MED ADMIN — Insulin Aspart Inj Soln 100 Unit/ML: 1 [IU] | SUBCUTANEOUS | NDC 73070010011

## 2024-10-17 MED ADMIN — Spironolactone Tab 25 MG: 25 mg | ORAL | NDC 60687046511

## 2024-10-17 MED ADMIN — Levothyroxine Sodium Tab 25 MCG: 25 ug | ORAL | NDC 60687045311

## 2024-10-17 NOTE — Plan of Care (Signed)
" °  Problem: Education: Goal: Ability to describe self-care measures that may prevent or decrease complications (Diabetes Survival Skills Education) will improve Outcome: Progressing   Problem: Coping: Goal: Ability to adjust to condition or change in health will improve Outcome: Progressing   Problem: Fluid Volume: Goal: Ability to maintain a balanced intake and output will improve Outcome: Progressing   Problem: Health Behavior/Discharge Planning: Goal: Ability to identify and utilize available resources and services will improve Outcome: Progressing Goal: Ability to manage health-related needs will improve Outcome: Progressing   Problem: Metabolic: Goal: Ability to maintain appropriate glucose levels will improve Outcome: Progressing   Problem: Nutritional: Goal: Maintenance of adequate nutrition will improve Outcome: Progressing Goal: Progress toward achieving an optimal weight will improve Outcome: Progressing   Problem: Skin Integrity: Goal: Risk for impaired skin integrity will decrease Outcome: Progressing   Problem: Tissue Perfusion: Goal: Adequacy of tissue perfusion will improve Outcome: Progressing   Problem: Education: Goal: Knowledge of General Education information will improve Description: Including pain rating scale, medication(s)/side effects and non-pharmacologic comfort measures Outcome: Progressing   Problem: Clinical Measurements: Goal: Ability to maintain clinical measurements within normal limits will improve Outcome: Progressing   "

## 2024-10-17 NOTE — Progress Notes (Addendum)
 "                                                                                                                                                                                                          Daily Progress Note   Patient Name: Bryan Singleton       Date: 10/17/2024 DOB: 12-Aug-1932  Age: 89 y.o. MRN#: [id number] Attending Physician: Maree Hue, MD Primary Care Physician: Fernande Ophelia [name], MD Admit Date: 10/14/2024  Reason for Consultation/Follow-up: Establishing goals of care  Subjective: Notes and labs reviewed. In to see patient. He is sitting in bedside chair with his wife present. We discussed updates. She states they were advised to restart Eliquis . We reviewed current imaging and neurology and cardiology note in detail as wife states she is unaware of updates and results. She states they will plan to restart Eliquis  today. Nursing in to discharge patient home. PMT departed room.    Length of Stay: 3  Current Medications: Scheduled Meds:   atorvastatin   10 mg Oral Daily   enoxaparin  (LOVENOX ) injection  40 mg Subcutaneous QHS   furosemide   60 mg Oral Daily   insulin  aspart  0-9 Units Subcutaneous TID WC   levothyroxine   25 mcg Oral Q0600   metoprolol  succinate  25 mg Oral Daily   spironolactone   25 mg Oral Daily    Continuous Infusions:   PRN Meds: acetaminophen  **OR** acetaminophen , ondansetron  **OR** ondansetron  (ZOFRAN ) IV, senna-docusate  Physical Exam Pulmonary:     Effort: Pulmonary effort is normal.  Neurological:     Mental Status: He is alert.             Vital Signs: BP 127/79 (BP Location: Left Arm)   Pulse 81   Temp 97.9 F (36.6 C) (Oral)   Resp 19   Ht 6' 2 (1.88 m)   Wt 94.2 kg   SpO2 100%   BMI 26.66 kg/m  SpO2: SpO2: 100 % O2 Device: O2 Device: Room Air O2 Flow Rate:    Intake/output summary:  Intake/Output Summary (Last 24 hours) at 10/17/2024 1349 Last data filed at 10/17/2024 0900 Gross per 24 hour  Intake 360 ml   Output 1850 ml  Net -1490 ml   LBM: Last BM Date : 10/16/24 Baseline Weight: Weight: 92.1 kg Most recent weight: Weight: 94.2 kg   Patient Active Problem List   Diagnosis Date Noted   Syncope 10/17/2024   Pleural effusion 10/17/2024   Ischemic stroke (HCC) 10/17/2024   Acute on chronic diastolic CHF (congestive heart failure) (  HCC) 10/14/2024   DM (diabetes mellitus), type 2 (HCC) 10/14/2024   Paroxysmal A-fib (HCC) 10/14/2024   Hypothyroidism 10/14/2024    Palliative Care Assessment & Plan    Recommendations/Plan: Patient discharging home. Patient plans to resume Eliquis  and follow up outpatient with providers.   PMT will sign off.    Code Status:    Code Status Orders  (From admission, onward)           Start     Ordered   10/14/24 2242  Do not attempt resuscitation (DNR)- Limited -Do Not Intubate (DNI)  Continuous       Question Answer Comment  If pulseless and not breathing No CPR or chest compressions.   In Pre-Arrest Conditions (Patient Is Breathing and Has A Pulse) Do not intubate. Provide all appropriate non-invasive medical interventions. Avoid ICU transfer unless indicated or required.   Consent: Discussion documented in EHR or advanced directives reviewed      10/14/24 2245           Code Status History     This patient has a current code status but no historical code status.      Advance Directive Documentation    Flowsheet Row Most Recent Value  Type of Advance Directive Healthcare Power of Attorney, Living will  Pre-existing out of facility DNR order (yellow form or pink MOST form) --  MOST Form in Place? --    Thank you for allowing the Palliative Medicine Team to assist in the care of this patient.   Time In: 1:00 Time Out: 1:55 Total Time 55 min Prolonged Time Billed  no       Greater than 50%  of this time was spent counseling and coordinating care related to the above assessment and plan.  Camelia Lewis, NP  Please  contact Palliative Medicine Team phone at [phone] for questions and concerns.       "

## 2024-10-17 NOTE — TOC CM/SW Note (Signed)
 Patient is not able to walk the distance required to go the bathroom, or he/she is unable to safely negotiate stairs required to access the bathroom.  A 3in1 BSC will alleviate this problem

## 2024-10-17 NOTE — Evaluation (Signed)
 Occupational Therapy Evaluation Patient Details Name: Bryan Singleton MRN: [id number] DOB: Aug 09, 1932 Today's Date: 10/17/2024   History of Present Illness   Pt is a 89 y/o M admitted on 10/14/24 after presenting with c/o LOC after getting up from a chair. Pt found to have large L sided pleural effusion, small R sided pleural effusion; s/p thoracentesis on 10/16/24. Pt is being treated for acute on chronic diastolic heart failure with further work-up pending. PMH: heart failure, COPD, DM2, CAD, paroxysmal a-fib, SSS with pacemaker, stroke, emphysema, renal disorder, HTN     Clinical Impressions Bryan Singleton was seen for OT evaluation this date. Prior to hospital admission, pt was generally independent with ADL management with wife providing assistance for LB dressing PRN. Pt lives with his spouse in a 1 level home with ~5 STE. Pt presents with deficits in strength, cognition, activity tolerance, and balance limiting their ability to perform ADL management at baseline level. Pt currently requires MIN A for STS t/fs and close CGA for safety during functional mobility with a RW.  Pt would benefit from skilled OT services to address noted impairments and functional limitations (see below for any additional details) in order to maximize safety and independence while minimizing future risk of falls, injury, and readmission. Anticipate the need for follow up OT services in the home setting upon acute hospital DC.      If plan is discharge home, recommend the following:   A little help with walking and/or transfers;A little help with bathing/dressing/bathroom;Help with stairs or ramp for entrance;Assist for transportation;Assistance with cooking/housework     Functional Status Assessment   Patient has had a recent decline in their functional status and demonstrates the ability to make significant improvements in function in a reasonable and predictable amount of time.     Equipment  Recommendations   BSC/3in1     Recommendations for Other Services         Precautions/Restrictions   Precautions Precautions: Fall Restrictions Weight Bearing Restrictions Per Provider Order: No     Mobility Bed Mobility Overal bed mobility: Needs Assistance Bed Mobility: Supine to Sit     Supine to sit: Min assist, HOB elevated, Used rails     General bed mobility comments: MIN A for trunk elevation, pt requires increased time/effort to perform.    Transfers Overall transfer level: Needs assistance Equipment used: Rolling walker (2 wheels) Transfers: Sit to/from Stand Sit to Stand: Min assist                  Balance Overall balance assessment: Needs assistance Sitting-balance support: Feet supported Sitting balance-Leahy Scale: Good     Standing balance support: During functional activity, Bilateral upper extremity supported, Reliant on assistive device for balance Standing balance-Leahy Scale: Poor                             ADL either performed or assessed with clinical judgement   ADL Overall ADL's : Needs assistance/impaired     Grooming: Sitting;Wash/dry face;Set up;Supervision/safety               Lower Body Dressing: Minimal assistance;Sit to/from stand   Toilet Transfer: Rolling walker (2 wheels);BSC/3in1;Regular Toilet;Minimal Dentist Details (indicate cue type and reason): MIN A for initial lift off to stand, CGA for safety for functional mobility with RW. Toileting- Clothing Manipulation and Hygiene: Sit to/from stand;Minimal assistance       Functional mobility during ADLs:  Rolling walker (2 wheels);Minimal assistance;Contact guard assist General ADL Comments: MIN A for initial lift off to stand, CGA for safety for functional mobility with RW.     Vision Patient Visual Report: No change from baseline       Perception         Praxis         Pertinent Vitals/Pain Pain  Assessment Pain Assessment: No/denies pain     Extremity/Trunk Assessment Upper Extremity Assessment Upper Extremity Assessment: Overall WFL for tasks assessed   Lower Extremity Assessment Lower Extremity Assessment: Generalized weakness       Communication Communication Communication: Impaired Factors Affecting Communication: Hearing impaired   Cognition Arousal: Alert, Lethargic Behavior During Therapy: WFL for tasks assessed/performed Cognition: History of cognitive impairments             OT - Cognition Comments: Oriented to self, place, and limited situation. Not typically oriented to date, per spouse.                 Following commands: Intact       Cueing  General Comments   Cueing Techniques: Verbal cues  HR >90% on room air, HR 84 bpm.   Exercises Other Exercises Other Exercises: Pt/caregiver educated on role of OT in acute setting, safe use of AE/DME for ADL managment, and DC recs.   Shoulder Instructions      Home Living Family/patient expects to be discharged to:: Private residence Living Arrangements: Spouse/significant other Available Help at Discharge: Available 24 hours/day;Family (lives with wife, daughter coming to assist as well) Type of Home: House Home Access: Stairs to enter Secretary/administrator of Steps: 4 Entrance Stairs-Rails: Right;Left Home Layout: One level     Bathroom Shower/Tub: Walk-in shower         Home Equipment: Pharmacist, Hospital (2 wheels);Cane - single point          Prior Functioning/Environment               Mobility Comments: Typically uses SPC, but has been told to use a walker in the past. Tends to furniture surf in the home. 1 fall in the last 6 months. ADLs Comments: Wife assists with LB dressing, but otherwise pretty independent. Can stand at sink to wash dishes.    OT Problem List: Decreased strength;Decreased coordination;Decreased range of motion;Decreased  cognition;Decreased activity tolerance;Decreased safety awareness;Impaired balance (sitting and/or standing);Decreased knowledge of use of DME or AE   OT Treatment/Interventions: Self-care/ADL training;Therapeutic exercise;Therapeutic activities;Patient/family education;DME and/or AE instruction;Balance training;Energy conservation      OT Goals(Current goals can be found in the care plan section)   Acute Rehab OT Goals Patient Stated Goal: To go home OT Goal Formulation: With patient Time For Goal Achievement: 10/31/24 Potential to Achieve Goals: Good ADL Goals Pt Will Perform Grooming: standing;with set-up;with supervision;with adaptive equipment Pt Will Transfer to Toilet: bedside commode;ambulating;with set-up;with supervision Pt Will Perform Toileting - Clothing Manipulation and hygiene: sit to/from stand;with set-up;with supervision   OT Frequency:  Min 2X/week    Co-evaluation              AM-PAC OT 6 Clicks Daily Activity     Outcome Measure Help from another person eating meals?: A Little Help from another person taking care of personal grooming?: A Little Help from another person toileting, which includes using toliet, bedpan, or urinal?: A Little Help from another person bathing (including washing, rinsing, drying)?: A Little Help from another person to put on and taking off  regular upper body clothing?: A Little Help from another person to put on and taking off regular lower body clothing?: A Little 6 Click Score: 18   End of Session Equipment Utilized During Treatment: Gait belt;Rolling walker (2 wheels) Nurse Communication: Mobility status  Activity Tolerance: Patient tolerated treatment well Patient left: with call bell/phone within reach;in chair;with chair alarm set;with family/visitor present  OT Visit Diagnosis: Other abnormalities of gait and mobility (R26.89);Muscle weakness (generalized) (M62.81)                Time: 9047-8977 OT Time Calculation  (min): 30 min Charges:  OT General Charges $OT Visit: 1 Visit OT Evaluation $OT Eval Moderate Complexity: 1 Mod OT Treatments $Self Care/Home Management : 8-22 mins  Jhonny Pelton, M.S., OTR/L 10/17/24, 1:02 PM

## 2024-10-17 NOTE — Care Management Important Message (Signed)
 Important Message  Patient Details  Name: Bryan Singleton MRN: [id number] Date of Birth: 03-24-1932   Important Message Given:  Yes - Medicare IM     Manveer Gomes W, CMA 10/17/2024, 11:56 AM

## 2024-10-17 NOTE — Discharge Instructions (Signed)

## 2024-10-17 NOTE — Evaluation (Signed)
 Physical Therapy Evaluation Patient Details Name: Bryan Singleton MRN: [id number] DOB: [birth year] Today's Date: 10/17/2024  History of Present Illness  Pt is a 89 y/o M admitted on 10/14/24 after presenting with c/o LOC after getting up from a chair. Pt found to have large L sided pleural effusion, small R sided pleural effusion; s/p thoracentesis on 10/16/24. Pt is being treated for acute on chronic diastolic heart failure with further work-up pending. PMH: heart failure, COPD, DM2, CAD, paroxysmal a-fib, SSS with pacemaker, stroke, emphysema, renal disorder, HTN  Clinical Impression  Pt seen for PT evaluation with pt agreeable to tx. Pt reports prior to admission he ambulated without AD but did hold to furniture, notes 1 fall in the past 6 months. On this date, pt requires min assist for transfer from low chair - educated pt & wife on ability to increase seat height by placing pillows in it. Pt is able to ambulate around nurses station with RW & CGA, multiple standing rest breaks 2/2 fatigue & SOB. Pt notes fatigue after gait. Recommend ongoing PT services to progress mobility & decrease caregiver burden as able.        If plan is discharge home, recommend the following: A little help with walking and/or transfers;A little help with bathing/dressing/bathroom;Assistance with cooking/housework;Assist for transportation;Help with stairs or ramp for entrance   Can travel by private vehicle        Equipment Recommendations None recommended by PT (pt already has RW)  Recommendations for Other Services       Functional Status Assessment Patient has had a recent decline in their functional status and demonstrates the ability to make significant improvements in function in a reasonable and predictable amount of time.     Precautions / Restrictions Precautions Precautions: Fall Restrictions Weight Bearing Restrictions Per Provider Order: No      Mobility  Bed Mobility                General bed mobility comments: not tested, pt received & left sitting in recliner    Transfers Overall transfer level: Needs assistance Equipment used: Rolling walker (2 wheels) Transfers: Sit to/from Stand Sit to Stand: Min assist           General transfer comment: cuing re: hand placement, extra time to power up to standing, min assist to correct posterior lean, extra time to transition BUE to RW from pushing up to standing    Ambulation/Gait Ambulation/Gait assistance: Contact guard assist Gait Distance (Feet): 160 Feet Assistive device: Rolling walker (2 wheels) Gait Pattern/deviations: Decreased step length - right, Decreased step length - left, Decreased dorsiflexion - right, Decreased dorsiflexion - left, Decreased stride length Gait velocity: decreased     General Gait Details: 3 standing rest breaks 2/2 fatigue & SOB despite SPO2 >90% on room air, educated pt on pursed lip breathing  Stairs            Wheelchair Mobility     Tilt Bed    Modified Rankin (Stroke Patients Only)       Balance Overall balance assessment: Needs assistance Sitting-balance support: Feet supported Sitting balance-Leahy Scale: Good     Standing balance support: During functional activity, Bilateral upper extremity supported, Reliant on assistive device for balance Standing balance-Leahy Scale: Poor                               Pertinent Vitals/Pain Pain Assessment Pain Assessment: No/denies pain  Home Living Family/patient expects to be discharged to:: Private residence Living Arrangements: Spouse/significant other Available Help at Discharge: Available 24 hours/day;Family (lives with wife, daughter coming to assist as well) Type of Home: House Home Access: Stairs to enter Entrance Stairs-Rails: Doctor, General Practice of Steps: 4   Home Layout: One level Home Equipment: Pharmacist, Hospital (2 wheels);Cane - single point       Prior Function               Mobility Comments: Typically uses SPC, but has been told to use a walker in the past. Tends to furniture surf in the home. 1 fall in the last 6 months. ADLs Comments: Wife assists with LB dressing, but otherwise pretty independent. Can stand at sink to wash dishes.     Extremity/Trunk Assessment   Upper Extremity Assessment Upper Extremity Assessment: Overall WFL for tasks assessed    Lower Extremity Assessment Lower Extremity Assessment: Generalized weakness       Communication   Communication Communication: Impaired Factors Affecting Communication: Hearing impaired    Cognition Arousal: Alert Behavior During Therapy: WFL for tasks assessed/performed   PT - Cognitive impairments: No apparent impairments                         Following commands: Intact       Cueing Cueing Techniques: Verbal cues     General Comments General comments (skin integrity, edema, etc.): HR >90% on room air, HR 84 bpm.    Exercises Other Exercises Other Exercises: Provided pt & wife with gait belt for increased safety for pt & caregiver during transfers/gait.   Assessment/Plan    PT Assessment Patient needs continued PT services  PT Problem List Decreased strength;Cardiopulmonary status limiting activity;Pain;Decreased range of motion;Decreased activity tolerance;Decreased knowledge of use of DME;Decreased balance;Decreased safety awareness;Decreased mobility       PT Treatment Interventions DME instruction;Therapeutic exercise;Gait training;Balance training;Stair training;Neuromuscular re-education;Functional mobility training;Cognitive remediation;Therapeutic activities;Patient/family education    PT Goals (Current goals can be found in the Care Plan section)  Acute Rehab PT Goals Patient Stated Goal: go home, resume OPPT PT Goal Formulation: With patient/family Time For Goal Achievement: 10/31/24 Potential to Achieve Goals: Good     Frequency Min 2X/week     Co-evaluation               AM-PAC PT 6 Clicks Mobility  Outcome Measure Help needed turning from your back to your side while in a flat bed without using bedrails?: A Little Help needed moving from lying on your back to sitting on the side of a flat bed without using bedrails?: A Little Help needed moving to and from a bed to a chair (including a wheelchair)?: A Little Help needed standing up from a chair using your arms (e.g., wheelchair or bedside chair)?: A Little Help needed to walk in hospital room?: A Little Help needed climbing 3-5 steps with a railing? : A Little 6 Click Score: 18    End of Session   Activity Tolerance: Patient tolerated treatment well;Patient limited by fatigue Patient left: in chair;with call bell/phone within reach;with chair alarm set;with family/visitor present   PT Visit Diagnosis: Unsteadiness on feet (R26.81);Muscle weakness (generalized) (M62.81);Other abnormalities of gait and mobility (R26.89)    Time: 1050-1105 PT Time Calculation (min) (ACUTE ONLY): 15 min   Charges:   PT Evaluation $PT Eval Low Complexity: 1 Low   PT General Charges $$ ACUTE PT VISIT: 1 Visit  Richerd Pinal, PT, DPT 10/17/24, 11:12 AM   Richerd CHRISTELLA Pinal 10/17/2024, 11:11 AM

## 2024-10-17 NOTE — Plan of Care (Signed)

## 2024-10-17 NOTE — Progress Notes (Signed)
 [facility] CLINIC CARDIOLOGY PROGRESS NOTE   Patient ID: Millie Shorb MRN: [id number] DOB/AGE: 02/10/1932 89 y.o.  Admit date: 10/14/2024 Referring Physician Dr. Cresencio Fairly  Primary Physician Fernande Ophelia [name], MD  Primary Cardiologist Dr. Florencio Reason for Consultation AoCHF  HPI: Burrel Legrand is a 89 y.o. male with a past medical history of chronic heart failure, paroxysmal atrial fibrillation, CKD, COPD, sick sinus syndrome with [employer]tr[employer] pacemaker who presented to the ED on 10/14/2024 for syncope. Concern for acute heart failure. Cardiology was consulted for further evaluation.   Interval History:  -Patient seen and examined this AM, resting in bed.  -States SOB is much better. LE edema improved.  -BP and HR stable, no events on tele.   Review of systems complete and found to be negative unless listed above   Vitals:   10/17/24 0041 10/17/24 0500 10/17/24 0524 10/17/24 0825  BP: 125/79  124/80 125/76  Pulse: 78   75  Resp: 20  17 20   Temp: 98.3 F (36.8 C)  97.8 F (36.6 C) 97.8 F (36.6 C)  TempSrc:    Oral  SpO2: 97%  97% 96%  Weight:  94.2 kg    Height:         Intake/Output Summary (Last 24 hours) at 10/17/2024 0949 Last data filed at 10/17/2024 0538 Gross per 24 hour  Intake 360 ml  Output 1850 ml  Net -1490 ml     PHYSICAL EXAM General: Chronically ill appearing elderly male, well nourished, in no acute distress. HEENT: Normocephalic and atraumatic. Neck: No JVD.  Lungs: Normal respiratory effort on room air. Clear bilaterally to auscultation. No wheezes, crackles, rhonchi.  Heart: HRRR. Normal S1 and S2 without gallops or murmurs. Radial & DP pulses 2+ bilaterally. Abdomen: Non-distended appearing.  Msk: Normal strength and tone for age. Extremities: No clubbing, cyanosis or edema.   Neuro: Alert and oriented X 3. Psych: Mood appropriate, affect congruent.    LABS: Basic Metabolic Panel: Recent Labs    10/16/24 0433 10/17/24 0341   NA 142 140  K 3.8 4.2  CL 101 99  CO2 30 30  GLUCOSE 96 91  BUN 27* 31*  CREATININE 1.23 1.27*  CALCIUM  8.7* 8.7*   Liver Function Tests: Recent Labs    10/14/24 1831 10/15/24 0537  AST 34 41  ALT 13 11  ALKPHOS 90 83  BILITOT 1.2 1.1  PROT 6.9 6.5  ALBUMIN 3.5 3.3*   No results for input(s): LIPASE, AMYLASE in the last 72 hours. CBC: Recent Labs    10/15/24 0537 10/16/24 0433  WBC 6.6 6.7  HGB 10.5* 11.0*  HCT 34.3* 36.1*  MCV 88.4 88.9  PLT 182 206   Cardiac Enzymes: No results for input(s): CKTOTAL, CKMB, CKMBINDEX, TROPONINIHS in the last 72 hours. BNP: No results for input(s): BNP in the last 72 hours. D-Dimer: Recent Labs    10/14/24 1831  DDIMER 3.06*   Hemoglobin A1C: Recent Labs    10/15/24 0537  HGBA1C 6.3*   Fasting Lipid Panel: No results for input(s): CHOL, HDL, LDLCALC, TRIG, CHOLHDL, LDLDIRECT in the last 72 hours. Thyroid  Function Tests: Recent Labs    10/15/24 2222  TSH 3.050  T4TOTAL 5.4   Anemia Panel: No results for input(s): VITAMINB12, FOLATE, FERRITIN, TIBC, IRON, RETICCTPCT in the last 72 hours.  MR BRAIN W WO CONTRAST Result Date: 10/16/2024 EXAM: MRI BRAIN WITH AND WITHOUT CONTRAST 10/16/2024 03:41:10 PM TECHNIQUE: Multiplanar multisequence MRI of the head/brain was performed with  and without the administration of intravenous contrast. COMPARISON: CT head 08/04/2025. CLINICAL HISTORY: Seizure, new-onset, no history of trauma; seizure vs convulsive syncope, persistent AMS without clear etiology. FINDINGS: BRAIN AND VENTRICLES: Scattered areas of restricted diffusion in the right corona radiata extending into the cortex and subcortical white matter of the right frontal operculum suggestive of acute infarcts. Additional acute cortical infarct within the right parietal lobe. No significant associated edema or mass effect. Mild chronic microvascular ischemic changes. There is moderate  generalized parenchymal volume loss. Remote lacunar infarct in the left thalamus. There is focal encephalomalacia and gliosis involving the mesial left temporal lobe along with associated susceptibility which could reflect a remote hemorrhagic infarct. Additionally, there is volume loss of the left hippocampus with increased T2 and FLAIR signal which could be related to prior infarct versus mesial temporal sclerosis. Recommend correlation with EEG findings. Within the limitations of motion artifact there is no evidence of abnormal intracranial enhancement. No acute intracranial hemorrhage. No mass effect or midline shift. No hydrocephalus. The sella is unremarkable. Normal flow voids. ORBITS: Bilateral lens replacement. SINUSES: Trace fluid in the mastoid air cells. BONES AND SOFT TISSUES: Normal bone marrow signal. No soft tissue abnormality. IMPRESSION: 1. Acute infarcts in the right corona radiata, right frontal operculum, and right parietal lobe. No significant associated edema or mass effect. 2. Focal encephalomalacia and gliosis in the mesial left temporal lobe with associated susceptibility, possibly reflecting a remote hemorrhagic infarct. Left hippocampal volume loss and increased T2/FLAIR signal that may relate to prior infarct versus mesial temporal sclerosis; consider EEG evaluation. Electronically signed by: Donnice Mania MD 10/16/2024 04:11 PM EST RP Workstation: [id number]   US  THORACENTESIS LEFT ASP PLEURAL SPACE W/IMG GUIDE Result Date: 10/16/2024 INDICATION: 89 year old male with a history of CKD, COPD, and sick sinus syndrome with pacemaker who presented to the ED following a syncopal episode. Imaging notable for large left-sided pleural effusion. Request for diagnostic and therapeutic thoracentesis. EXAM: ULTRASOUND GUIDED DIAGNOSTIC AND THERAPEUTIC, LEFT-SIDED THORACENTESIS MEDICATIONS: 1% lidocaine , 6 mL. COMPLICATIONS: None immediate. PROCEDURE: An ultrasound guided thoracentesis was  thoroughly discussed with the patient and questions answered. The benefits, risks, alternatives and complications were also discussed. The patient understands and wishes to proceed with the procedure. Written consent was obtained. Ultrasound was performed to localize and mark an adequate pocket of fluid in the left chest. The area was then prepped and draped in the normal sterile fashion. 1% Lidocaine  was used for local anesthesia. Under ultrasound guidance a 6 Fr Safe-T-Centesis catheter was introduced. Thoracentesis was performed. The catheter was removed and a dressing applied. FINDINGS: A total of approximately 700 mL of clear yellow fluid was removed. Samples were sent to the laboratory as requested by the clinical team. IMPRESSION: Successful ultrasound guided left-sided thoracentesis yielding 700 mL of pleural fluid. Procedure performed by: Sherrilee Bal, PA-C under the supervision of Dr. JONETTA Faes Electronically Signed   By: JONETTA Faes M.D.   On: 10/16/2024 15:16   ECHOCARDIOGRAM COMPLETE Result Date: 10/16/2024    ECHOCARDIOGRAM REPORT   Patient Name:   SONAM HUELSMANN Date of Exam: 10/16/2024 Medical Rec #:  [id number]             Height:       74.0 in Accession #:    [id number]            Weight:       207.0 lb Date of Birth:  [birth year]  BSA:          2.206 m Patient Age:    89 years              BP:           126/75 mmHg Patient Gender: M                     HR:           58 bpm. Exam Location:  [facility] Procedure: 2D Echo, Cardiac Doppler and Color Doppler (Both Spectral and Color            Flow Doppler were utilized during procedure).                                 MODIFIED REPORT: This report was modified by Cara JONETTA Lovelace MD on 10/16/2024 due to Lead in RV.  Indications:     Dyspnea R06.00  History:         Patient has no prior history of Echocardiogram examinations.                  Pacemaker, Stroke; Risk Factors:Hypertension. Emphysema of                  lung, renal  disorder.  Sonographer:     Christopher Furnace Referring Phys:  [id number] [id number] [facility] Diagnosing Phys: Cara JONETTA Lovelace MD  Sonographer Comments: Image acquisition challenging due to COPD. IMPRESSIONS  1. Ant/apical septal hypo.  2. Left ventricular ejection fraction, by estimation, is 40 to 45%. The left ventricle has mildly decreased function. The left ventricle demonstrates regional wall motion abnormalities (see scoring diagram/findings for description). The left ventricular  internal cavity size was moderately to severely dilated. Left ventricular diastolic function could not be evaluated.  3. Right ventricular systolic function is mildly reduced. The right ventricular size is moderately enlarged. Mildly increased right ventricular wall thickness.  4. Left atrial size was moderately dilated.  5. Right atrial size was moderately dilated.  6. The mitral valve is normal in structure. Mild mitral valve regurgitation.  7. The aortic valve is calcified. Aortic valve regurgitation is not visualized. Mild aortic valve stenosis. FINDINGS  Left Ventricle: Left ventricular ejection fraction, by estimation, is 40 to 45%. The left ventricle has mildly decreased function. The left ventricle demonstrates regional wall motion abnormalities. Strain was performed and the global longitudinal strain is indeterminate. The left ventricular internal cavity size was moderately to severely dilated. There is borderline concentric left ventricular hypertrophy. Left ventricular diastolic function could not be evaluated. Right Ventricle: The right ventricular size is moderately enlarged. Mildly increased right ventricular wall thickness. Right ventricular systolic function is mildly reduced. Left Atrium: Left atrial size was moderately dilated. Right Atrium: Right atrial size was moderately dilated. Pericardium: There is no evidence of pericardial effusion. Mitral Valve: The mitral valve is normal in structure. Mild mitral valve regurgitation.  Tricuspid Valve: The tricuspid valve is normal in structure. Tricuspid valve regurgitation is mild. Aortic Valve: The aortic valve is calcified. Aortic valve regurgitation is not visualized. Mild aortic stenosis is present. Aortic valve mean gradient measures 2.0 mmHg. Aortic valve peak gradient measures 3.6 mmHg. Aortic valve area, by VTI measures 1.71 cm. Pulmonic Valve: The pulmonic valve was not well visualized. Pulmonic valve regurgitation is not visualized. Aorta: The aortic root was not well visualized. IAS/Shunts: No atrial level shunt detected by color flow  Doppler. Additional Comments: Ant/apical septal hypo. 3D was performed not requiring image post processing on an independent workstation and was indeterminate. A device lead is visualized.  LEFT VENTRICLE PLAX 2D LVIDd:         5.40 cm LVIDs:         3.90 cm LV PW:         1.10 cm LV IVS:        1.10 cm LVOT diam:     2.10 cm LV SV:         26 LV SV Index:   12 LVOT Area:     3.46 cm  RIGHT VENTRICLE RV Basal diam:  5.80 cm RV Mid diam:    4.30 cm LEFT ATRIUM           Index        RIGHT ATRIUM           Index LA diam:      4.70 cm 2.13 cm/m   RA Area:     39.40 cm LA Vol (A4C): 43.4 ml 19.68 ml/m  RA Volume:   180.00 ml 81.61 ml/m  AORTIC VALVE AV Area (Vmax):    1.59 cm AV Area (Vmean):   1.32 cm AV Area (VTI):     1.71 cm AV Vmax:           95.35 cm/s AV Vmean:          66.000 cm/s AV VTI:            0.154 m AV Peak Grad:      3.6 mmHg AV Mean Grad:      2.0 mmHg LVOT Vmax:         43.90 cm/s LVOT Vmean:        25.200 cm/s LVOT VTI:          0.076 m LVOT/AV VTI ratio: 0.49  AORTA Ao Root diam: 3.30 cm MITRAL VALVE               TRICUSPID VALVE MV Area (PHT): 5.46 cm    TR Peak grad:   31.1 mmHg MV Decel Time: 139 msec    TR Vmax:        279.00 cm/s MV E velocity: 69.20 cm/s                            SHUNTS                            Systemic VTI:  0.08 m                            Systemic Diam: 2.10 cm Dwayne JONETTA Lovelace MD Electronically  signed by Cara JONETTA Lovelace MD Signature Date/Time: 10/16/2024/1:54:31 PM    Final (Updated)    DG Chest Port 1 View Result Date: 10/16/2024 CLINICAL DATA:  Status post left-sided thoracentesis. EXAM: PORTABLE CHEST 1 VIEW COMPARISON:  October 14, 2024 FINDINGS: There is stable single lead ventricular pacer positioning. Multiple sternal wires and vascular clips are noted. The cardiac silhouette is enlarged and unchanged in size. There is marked severity calcification of the aortic arch. Mild, stable linear scarring and/or atelectasis is seen within the mid right lung. No pleural effusion or pneumothorax is identified. Multilevel degenerative changes are seen throughout the thoracic spine. IMPRESSION: 1. Stable cardiomegaly with evidence of  prior median sternotomy/CABG. 2. Mild, stable linear scarring and/or atelectasis within the mid right lung. 3. No pneumothorax, status post left-sided thoracentesis. Electronically Signed   By: Suzen Dials M.D.   On: 10/16/2024 12:40     ECHO as above  TELEMETRY (personally reviewed): ventricular pacing rate 70s, PVCs  EKG (personally reviewed): ventricular pacing rate 76 bpm  DATA reviewed by me 10/17/24: last 24h vitals tele labs imaging I/O, hospitalist progress note, neurology notes  Principal Problem:   Acute on chronic diastolic CHF (congestive heart failure) (HCC) Active Problems:   DM (diabetes mellitus), type 2 (HCC)   Paroxysmal A-fib (HCC)   Hypothyroidism   Syncope   Pleural effusion   Ischemic stroke (HCC)    ASSESSMENT AND PLAN: Future Yeldell is a 89 y.o. male with a past medical history of chronic heart failure, paroxysmal atrial fibrillation, CKD, COPD, sick sinus syndrome with Medtronic pacemaker who presented to the ED on 10/14/2024 for syncope. Concern for acute heart failure. Cardiology was consulted for further evaluation.   # Acute on chronic heart failure # SSS s/p PPM # Syncope # Paroxysmal atrial  fibrillation Patient presented after syncopal events. CXR with pleural effusion and LE edema noted on exam. Started on IV lasix  with good UOP. Echo this admission with EF 40-45%, anterior/apical septal hypokinesis (similar to prior echo in our office), moderate to severe LV dilation, mild MR. MRI brain yesterday with embolic appearing CVAs.  -Transition to PO lasix  60 mg daily.  -Continue spironolactone  25 mg daily and metoprolol  succinate 25 mg daily.  -Per daughter patient no longer on eliquis  for stroke risk reduction 2/2 AF. They cannot recall why this was stopped. Given MRI brain findings would recommend resuming this which is ok per neurology. Patient and wife requested to discuss with PCP before resuming. -Continue home atorvastatin  10 mg daily.   This patient's case was discussed and created with Dr. Florencio and he is in agreement.  Signed:  Danita Bloch, PA-C  10/17/2024, 9:49 AM [facility] Cardiology

## 2024-10-17 NOTE — TOC Transition Note (Signed)
 Transition of Care [facility]) - Discharge Note   Patient Details  Name: Bryan Singleton MRN: [id number] Date of Birth: [birth year]  [facility] CM/SW Contact:  Lauraine [name], LCSW Phone Number: 10/17/2024, 11:03 AM   Clinical Narrative: Patient has orders to discharge home today. CSW met with patient. Wife at bedside. CSW introduced role and explained that therapy recommendations would be discussed. Patient and wife declined home health therapy. They prefer to resume outpatient therapy at [facility]. CSW sent chat to MD requesting that he cosign new order form. Will fax once complete. Patient was recently receiving outpatient speech therapy but wife stated he had been discharged from that service. Wife requesting 3-in-1. CSW ordered through [employer]. Wife declined outpatient palliative but will contact patient's PCP if she changes her mind. No further concerns. CSW signing off.    Final next level of care: OP Rehab Barriers to Discharge: No Barriers Identified   Patient Goals and CMS Choice            Discharge Placement                Patient to be transferred to facility by: Wife Name of family member notified: Ronal Dy Patient and family notified of of transfer: 10/17/24  Discharge Plan and Services Additional resources added to the After Visit Summary for                  DME Arranged: 3-N-1 DME Agency: [employer] Date DME Agency Contacted: 10/17/24   Representative spoke with at DME Agency: Thomasina            Social Drivers of Health (SDOH) Interventions SDOH Screenings   Food Insecurity: No Food Insecurity (10/15/2024)  Housing: Low Risk (10/15/2024)  Transportation Needs: No Transportation Needs (10/15/2024)  Utilities: Not At Risk (10/15/2024)  Social Connections: Socially Integrated (10/15/2024)  Tobacco Use: Medium Risk (10/15/2024)     Readmission Risk Interventions     No data to display

## 2024-10-17 NOTE — TOC CM/SW Note (Signed)
" ° ° ° °  [facility] REFERRAL        Occupational Therapy * Physical Therapy * Speech Therapy                           DATE 10/17/2024  PATIENT NAME Bryan Singleton  PATIENT MRN [id number]       DIAGNOSIS/DIAGNOSIS CODE I50.33, I48.0, E03.9, R55, J90, I63.9,   DATE OF DISCHARGE: 10/17/2024       PRIMARY CARE PHYSICIAN   Ophelia Sage, MD  PCP PHONE/FAX Phone: [phone]     Dear Provider (Name: [facility]  Fax: [phone]   I certify that I have examined this patient and that occupational/physical/speech therapy is necessary on an outpatient basis.    The patient has expressed interest in completing their recommended course of therapy at your  location.  Once a formal order from the patient's primary care physician has been obtained, please  contact him/her to schedule an appointment for evaluation at your earliest convenience.   [ x ]  Physical Therapy Evaluate and Treat  [ x ]  Occupational Therapy Evaluate and Treat  [  ]  Speech Therapy Evaluate and Treat         The patient's primary care physician (listed above) must furnish and be responsible for a formal order such that the recommended services may be furnished while under the primary physician's care, and that the plan of care will be established and reviewed every 30 days (or more often if condition necessitates).   "

## 2024-10-17 NOTE — Procedures (Signed)
 Patient Name: Jaeshaun Riva  MRN: [id number]  Epilepsy Attending: Arlin MALVA Krebs  Referring Physician/Provider: Maree Hue, MD  Date: 10/16/2024 Duration: 22.11 mins  Patient history: 89yo M with syncope. EEG to evaluate for seizure  Level of alertness: Awake  AEDs during EEG study: None  Technical aspects: This EEG study was done with scalp electrodes positioned according to the 10-20 International system of electrode placement. Electrical activity was reviewed with band pass filter of 1-70Hz , sensitivity of 7 uV/mm, display speed of 33mm/sec with a 60Hz  notched filter applied as appropriate. EEG data were recorded continuously and digitally stored.  Video monitoring was available and reviewed as appropriate.  Description: The posterior dominant rhythm consists of 8-9 Hz activity of moderate voltage (25-35 uV) seen predominantly in posterior head regions, symmetric and reactive to eye opening and eye closing. EEG showed intermittent generalized 3 to 6 Hz theta-delta slowing. Physiologic photic driving was seen during photic stimulation.  Hyperventilation was not performed.     ABNORMALITY - Intermittent slow, generalized  IMPRESSION: This study is suggestive of mild generalized cerebral dysfunction (encephalopathy). No seizures or epileptiform discharges were seen throughout the recording.  Tyrihanna Wingert O Eknoor Novack

## 2024-10-18 NOTE — Discharge Summary (Signed)
 " Physician Discharge Summary   Patient: Bryan Singleton MRN: [id number] DOB: April 17, 1932  Admit date:     10/14/2024  Discharge date: 10/17/2024  Discharge Physician: Cresencio Fairly   PCP: Fernande Ophelia [name], MD   Recommendations at discharge:    F/up with outpt providers as requested  Discharge Diagnoses: Principal Problem:   Acute on chronic diastolic CHF (congestive heart failure) (HCC) Active Problems:   DM (diabetes mellitus), type 2 (HCC)   Paroxysmal A-fib (HCC)   Hypothyroidism   Syncope   Pleural effusion   Ischemic stroke [facility])  Hospital Course: Assessment and Plan:  89 year old man with a history of heart failure, COPD, type 2 diabetes, CAD, paroxysmal A-fib, sick sinus syndrome with [employer] pacemaker who presented to the ED after an episode at home and which he tried to get up from a chair and had a brief loss of consciousness in which his eyes rolled back in his head and speech was mumbling. Lasted approximately 1 to 2 minutes then returned to baseline    1/18: Cardio & Neuro c/s, MRI brain, EEG, echo, US  guided Thora on the left 1/19: EEG and MRI     Presyncope/seizure Acute Stroke - EEG suggestive of mild generalized cerebral dysfunction (encephalopathy). No seizures or epileptiform discharges were seen throughout the recording.  - echo shows EF of 40 to 45%, MRI brain pending - Neuro & cardio seen MRI brain showed small acute infarcts in the right corona radiata, right frontal operculum and right parietal lobe. likely embolic in etiology per Neuro.  Evidence of chronic infarcts noted as well.  This may very well be the etiology of his underlying cognitive issues as well. D/w his PCP on reason why Eliquis  was on hold/stopped. Patient had potential GI bleed/anemia - but none active found. After d/w neuro, Cardio & PCP - he's restarted on Eliquis . Risks and benefits explained. Daughter, wife aware and agreeable with the plan. A1c 6.3.  Echocardiogram reveals an EF of  40-45% with no cardiac source of emboli identified.     Acute on chronic diastolic heart failure Appears volume overloaded with bilateral lower extremity edema and pleural effusion seen on CT scan.   CXR with pleural effusion and LE edema noted on exam. Diuresed with IV lasix  with good UOP. Echo this admission with EF 40-45%, anterior/apical septal hypokinesis (similar to prior echo in our office), moderate to severe LV dilation, mild MR. MRI brain yesterday with embolic appearing CVAs.  -Transition to PO lasix  60 mg daily at DC -Continue spironolactone  25 mg daily and metoprolol  succinate 25 mg daily.  -Per daughter patient no longer on eliquis  for stroke risk reduction 2/2 AF. They cannot recall why this was stopped. Given MRI brain findings Cardio recommends resuming this which is ok per neurology. I've discussed with his PCP before resuming. -Continue home atorvastatin  10 mg daily.    Large left-sided pleural effusion  Seen on CT chest - also has small right-sided effusion with associated bibasilar atelectasis - Successful ultrasound-guided thoracentesis 1/19 with removal of 700 mL of dark yellow fluid Labs sent and pending at DC but considering patient and family prefers to go home - this is likely transudate due to CHF. I've d/w Pulmo Dr Parris who will set office f/up with him to go over this lab results and further mgmt.    DM type II   Paroxysmal A-fib - Continue metoprolol  25 mg daily - Resume Eliquis  at home   Hypothyroidism Continue levothyroxine   Consultants: Cardio, Neuro, Palliative care  Disposition: Home Diet recommendation:  Carb modified diet DISCHARGE MEDICATION: Allergies as of 10/17/2024       Reactions   Allegra Allergy [fexofenadine Hcl]    Penicillins    Prednisone    Shellfish Allergy         Medication List     TAKE these medications    albuterol  108 (90 Base) MCG/ACT inhaler Commonly known as: VENTOLIN  HFA Inhale 2 puffs into  the lungs every 6 (six) hours as needed for wheezing or shortness of breath.   atorvastatin  10 MG tablet Commonly known as: LIPITOR Take 10 mg by mouth daily.   Eliquis  5 MG Tabs tablet Generic drug: apixaban  Take 1 tablet (5 mg total) by mouth 2 (two) times daily.   ferrous sulfate  325 (65 FE) MG EC tablet Take 1 tablet by mouth every other day.   furosemide  20 MG tablet Commonly known as: LASIX  Take 3 tablets (60 mg total) by mouth daily. What changed:  medication strength how much to take   levothyroxine  25 MCG tablet Commonly known as: SYNTHROID  Take 25 mcg by mouth every morning.   metoprolol  succinate 25 MG 24 hr tablet Commonly known as: TOPROL -XL Take 25 mg by mouth daily.   Prevagen Extra Strength 20 MG Caps Generic drug: Apoaequorin Take 20 mg by mouth daily.   spironolactone  25 MG tablet Commonly known as: ALDACTONE  Take 25 mg by mouth daily.   triamcinolone cream 0.1 % Commonly known as: KENALOG Apply 1 Application topically 2 (two) times daily as needed.        Follow-up Information     Alluri, Keller BROCKS, MD. Schedule an appointment as soon as possible for a visit in 1 week(s).   Specialty: Cardiology Why: Hospital Discharge- Jan. 27th @ 12:30 PM Contact information: [address] [place] [address] [phone]         Fernande Ophelia PARAS III, MD. Schedule an appointment as soon as possible for a visit in 3 day(s).   Specialty: Internal Medicine Why: [facility] Discharge F/UP  Hospital Follow-up: Contact information: [address] [facility] [place] [address] [phone]         Maree Jannett POUR, MD. Schedule an appointment as soon as possible for a visit in 2 week(s).   Specialty: Neurology Why: [facility] Discharge F/UP Contact information: [address] [facility] [place] [address] [phone]                Discharge Exam: Filed Weights   10/15/24  0754 10/16/24 0500 10/17/24 0500  Weight: 83.9 kg 93.9 kg 94.2 kg   General exam: Appears calm and comfortable  Respiratory system: Rales at bases b/l, Respiratory effort normal. Cardiovascular system: S1 & S2 heard, RRR. No murmurs, Trace pedal edema B/L,  Gastrointestinal system: Abdomen is soft, benign Central nervous system: Alert and oriented. No focal neurological deficits. Extremities: Symmetric 5 x 5 power. Skin: No rashes, lesions or ulcers Psychiatry: Judgement and insight appear normal. Mood & affect appropriate.   Condition at discharge: fair  The results of significant diagnostics from this hospitalization (including imaging, microbiology, ancillary and laboratory) are listed below for reference.   Imaging Studies: EEG adult Result Date: 10/17/2024 Shelton Arlin KIDD, MD     10/17/2024 11:54 AM Patient Name: Molly Maselli MRN: [id number] Epilepsy Attending: Arlin KIDD Shelton Referring Physician/Provider: Maree Hue, MD Date: 10/16/2024 Duration: 22.11 mins Patient history: 89yo M with syncope.  EEG to evaluate for seizure Level of alertness: Awake AEDs during EEG study: None Technical aspects: This EEG study was done with scalp electrodes positioned according to the 10-20 International system of electrode placement. Electrical activity was reviewed with band pass filter of 1-70Hz , sensitivity of 7 uV/mm, display speed of 39mm/sec with a 60Hz  notched filter applied as appropriate. EEG data were recorded continuously and digitally stored.  Video monitoring was available and reviewed as appropriate. Description: The posterior dominant rhythm consists of 8-9 Hz activity of moderate voltage (25-35 uV) seen predominantly in posterior head regions, symmetric and reactive to eye opening and eye closing. EEG showed intermittent generalized 3 to 6 Hz theta-delta slowing. Physiologic photic driving was seen during photic stimulation.  Hyperventilation was not performed.   ABNORMALITY -  Intermittent slow, generalized IMPRESSION: This study is suggestive of mild generalized cerebral dysfunction (encephalopathy). No seizures or epileptiform discharges were seen throughout the recording. Priyanka O Yadav   MR BRAIN W WO CONTRAST Result Date: 10/16/2024 EXAM: MRI BRAIN WITH AND WITHOUT CONTRAST 10/16/2024 03:41:10 PM TECHNIQUE: Multiplanar multisequence MRI of the head/brain was performed with and without the administration of intravenous contrast. COMPARISON: CT head 08/04/2025. CLINICAL HISTORY: Seizure, new-onset, no history of trauma; seizure vs convulsive syncope, persistent AMS without clear etiology. FINDINGS: BRAIN AND VENTRICLES: Scattered areas of restricted diffusion in the right corona radiata extending into the cortex and subcortical white matter of the right frontal operculum suggestive of acute infarcts. Additional acute cortical infarct within the right parietal lobe. No significant associated edema or mass effect. Mild chronic microvascular ischemic changes. There is moderate generalized parenchymal volume loss. Remote lacunar infarct in the left thalamus. There is focal encephalomalacia and gliosis involving the mesial left temporal lobe along with associated susceptibility which could reflect a remote hemorrhagic infarct. Additionally, there is volume loss of the left hippocampus with increased T2 and FLAIR signal which could be related to prior infarct versus mesial temporal sclerosis. Recommend correlation with EEG findings. Within the limitations of motion artifact there is no evidence of abnormal intracranial enhancement. No acute intracranial hemorrhage. No mass effect or midline shift. No hydrocephalus. The sella is unremarkable. Normal flow voids. ORBITS: Bilateral lens replacement. SINUSES: Trace fluid in the mastoid air cells. BONES AND SOFT TISSUES: Normal bone marrow signal. No soft tissue abnormality. IMPRESSION: 1. Acute infarcts in the right corona radiata, right  frontal operculum, and right parietal lobe. No significant associated edema or mass effect. 2. Focal encephalomalacia and gliosis in the mesial left temporal lobe with associated susceptibility, possibly reflecting a remote hemorrhagic infarct. Left hippocampal volume loss and increased T2/FLAIR signal that may relate to prior infarct versus mesial temporal sclerosis; consider EEG evaluation. Electronically signed by: Donnice Mania MD 10/16/2024 04:11 PM EST RP Workstation: [id number]   US  THORACENTESIS LEFT ASP PLEURAL SPACE W/IMG GUIDE Result Date: 10/16/2024 INDICATION: 89 year old male with a history of CKD, COPD, and sick sinus syndrome with pacemaker who presented to the ED following a syncopal episode. Imaging notable for large left-sided pleural effusion. Request for diagnostic and therapeutic thoracentesis. EXAM: ULTRASOUND GUIDED DIAGNOSTIC AND THERAPEUTIC, LEFT-SIDED THORACENTESIS MEDICATIONS: 1% lidocaine , 6 mL. COMPLICATIONS: None immediate. PROCEDURE: An ultrasound guided thoracentesis was thoroughly discussed with the patient and questions answered. The benefits, risks, alternatives and complications were also discussed. The patient understands and wishes to proceed with the procedure. Written consent was obtained. Ultrasound was performed to localize and mark an adequate pocket of fluid in the left chest. The area was then prepped and  draped in the normal sterile fashion. 1% Lidocaine  was used for local anesthesia. Under ultrasound guidance a 6 Fr Safe-T-Centesis catheter was introduced. Thoracentesis was performed. The catheter was removed and a dressing applied. FINDINGS: A total of approximately 700 mL of clear yellow fluid was removed. Samples were sent to the laboratory as requested by the clinical team. IMPRESSION: Successful ultrasound guided left-sided thoracentesis yielding 700 mL of pleural fluid. Procedure performed by: Sherrilee Bal, PA-C under the supervision of Dr. JONETTA Faes  Electronically Signed   By: JONETTA Faes M.D.   On: 10/16/2024 15:16   ECHOCARDIOGRAM COMPLETE Result Date: 10/16/2024    ECHOCARDIOGRAM REPORT   Patient Name:   KODEE RAVERT Date of Exam: 10/16/2024 Medical Rec #:  [id number]             Height:       74.0 in Accession #:    [id number]            Weight:       207.0 lb Date of Birth:  [birth year]             BSA:          2.206 m Patient Age:    89 years              BP:           126/75 mmHg Patient Gender: M                     HR:           58 bpm. Exam Location:  [facility] Procedure: 2D Echo, Cardiac Doppler and Color Doppler (Both Spectral and Color            Flow Doppler were utilized during procedure).                                 MODIFIED REPORT: This report was modified by Cara JONETTA Lovelace MD on 10/16/2024 due to Lead in RV.  Indications:     Dyspnea R06.00  History:         Patient has no prior history of Echocardiogram examinations.                  Pacemaker, Stroke; Risk Factors:Hypertension. Emphysema of                  lung, renal disorder.  Sonographer:     Christopher Furnace Referring Phys:  [id number] Avrie Kedzierski [facility] Diagnosing Phys: Cara JONETTA Lovelace MD  Sonographer Comments: Image acquisition challenging due to COPD. IMPRESSIONS  1. Ant/apical septal hypo.  2. Left ventricular ejection fraction, by estimation, is 40 to 45%. The left ventricle has mildly decreased function. The left ventricle demonstrates regional wall motion abnormalities (see scoring diagram/findings for description). The left ventricular  internal cavity size was moderately to severely dilated. Left ventricular diastolic function could not be evaluated.  3. Right ventricular systolic function is mildly reduced. The right ventricular size is moderately enlarged. Mildly increased right ventricular wall thickness.  4. Left atrial size was moderately dilated.  5. Right atrial size was moderately dilated.  6. The mitral valve is normal in structure. Mild mitral valve regurgitation.  7. The  aortic valve is calcified. Aortic valve regurgitation is not visualized. Mild aortic valve stenosis. FINDINGS  Left Ventricle: Left ventricular ejection fraction, by estimation, is 40 to 45%. The left ventricle has  mildly decreased function. The left ventricle demonstrates regional wall motion abnormalities. Strain was performed and the global longitudinal strain is indeterminate. The left ventricular internal cavity size was moderately to severely dilated. There is borderline concentric left ventricular hypertrophy. Left ventricular diastolic function could not be evaluated. Right Ventricle: The right ventricular size is moderately enlarged. Mildly increased right ventricular wall thickness. Right ventricular systolic function is mildly reduced. Left Atrium: Left atrial size was moderately dilated. Right Atrium: Right atrial size was moderately dilated. Pericardium: There is no evidence of pericardial effusion. Mitral Valve: The mitral valve is normal in structure. Mild mitral valve regurgitation. Tricuspid Valve: The tricuspid valve is normal in structure. Tricuspid valve regurgitation is mild. Aortic Valve: The aortic valve is calcified. Aortic valve regurgitation is not visualized. Mild aortic stenosis is present. Aortic valve mean gradient measures 2.0 mmHg. Aortic valve peak gradient measures 3.6 mmHg. Aortic valve area, by VTI measures 1.71 cm. Pulmonic Valve: The pulmonic valve was not well visualized. Pulmonic valve regurgitation is not visualized. Aorta: The aortic root was not well visualized. IAS/Shunts: No atrial level shunt detected by color flow Doppler. Additional Comments: Ant/apical septal hypo. 3D was performed not requiring image post processing on an independent workstation and was indeterminate. A device lead is visualized.  LEFT VENTRICLE PLAX 2D LVIDd:         5.40 cm LVIDs:         3.90 cm LV PW:         1.10 cm LV IVS:        1.10 cm LVOT diam:     2.10 cm LV SV:         26 LV SV Index:    12 LVOT Area:     3.46 cm  RIGHT VENTRICLE RV Basal diam:  5.80 cm RV Mid diam:    4.30 cm LEFT ATRIUM           Index        RIGHT ATRIUM           Index LA diam:      4.70 cm 2.13 cm/m   RA Area:     39.40 cm LA Vol (A4C): 43.4 ml 19.68 ml/m  RA Volume:   180.00 ml 81.61 ml/m  AORTIC VALVE AV Area (Vmax):    1.59 cm AV Area (Vmean):   1.32 cm AV Area (VTI):     1.71 cm AV Vmax:           95.35 cm/s AV Vmean:          66.000 cm/s AV VTI:            0.154 m AV Peak Grad:      3.6 mmHg AV Mean Grad:      2.0 mmHg LVOT Vmax:         43.90 cm/s LVOT Vmean:        25.200 cm/s LVOT VTI:          0.076 m LVOT/AV VTI ratio: 0.49  AORTA Ao Root diam: 3.30 cm MITRAL VALVE               TRICUSPID VALVE MV Area (PHT): 5.46 cm    TR Peak grad:   31.1 mmHg MV Decel Time: 139 msec    TR Vmax:        279.00 cm/s MV E velocity: 69.20 cm/s  SHUNTS                            Systemic VTI:  0.08 m                            Systemic Diam: 2.10 cm Cara JONETTA Lovelace MD Electronically signed by Cara JONETTA Lovelace MD Signature Date/Time: 10/16/2024/1:54:31 PM    Final (Updated)    DG Chest Port 1 View Result Date: 10/16/2024 CLINICAL DATA:  Status post left-sided thoracentesis. EXAM: PORTABLE CHEST 1 VIEW COMPARISON:  October 14, 2024 FINDINGS: There is stable single lead ventricular pacer positioning. Multiple sternal wires and vascular clips are noted. The cardiac silhouette is enlarged and unchanged in size. There is marked severity calcification of the aortic arch. Mild, stable linear scarring and/or atelectasis is seen within the mid right lung. No pleural effusion or pneumothorax is identified. Multilevel degenerative changes are seen throughout the thoracic spine. IMPRESSION: 1. Stable cardiomegaly with evidence of prior median sternotomy/CABG. 2. Mild, stable linear scarring and/or atelectasis within the mid right lung. 3. No pneumothorax, status post left-sided thoracentesis. Electronically  Signed   By: Suzen Dials M.D.   On: 10/16/2024 12:40   CT Angio Chest Pulmonary Embolism (PE) W or WO Contrast Result Date: 10/14/2024 EXAM: CTA CHEST 10/14/2024 08:56:03 PM TECHNIQUE: CTA of the chest was performed without and with the administration of 75 mL of iohexol  (OMNIPAQUE ) 350 MG/ML intravenous contrast. Multiplanar reformatted images are provided for review. MIP images are provided for review. Automated exposure control, iterative reconstruction, and/or weight based adjustment of the mA/kV was utilized to reduce the radiation dose to as low as reasonably achievable. COMPARISON: Chest x-ray from earlier in the same day. CLINICAL HISTORY: Pulmonary embolism (PE) suspected, low to intermediate prob, positive D-dimer. Dyspnea and weakness, initial encounter. FINDINGS: PULMONARY ARTERIES: No acute pulmonary embolus. Main pulmonary artery is normal in caliber. The pulmonary artery shows a normal branching pattern bilaterally. No filling defect to suggest pulmonary embolism is noted. MEDIASTINUM: The heart and pericardium demonstrate no acute abnormality. Changes of prior coronary bypass grafting are seen. A pacing device is noted on the left. Atherosclerotic calcifications of the aorta are noted. Mild dilatation of the ascending aorta of 4.1 cm is noted. No dissection is seen. The esophagus, as visualized, is within normal limits. LYMPH NODES: No mediastinal, hilar or axillary lymphadenopathy. LUNGS AND PLEURA: Large left-sided pleural effusion is noted. Small right-sided effusion is seen. Some associated atelectatic changes are noted in the bases. No parenchymal nodules are seen. No pneumothorax. UPPER ABDOMEN: Reflux of contrast into the IVC and hepatic veins is noted. The IVC is prominent. This may be related to some underlying right heart failure. Visualized upper abdomen shows mild ascites. The gallbladder has been surgically removed. SOFT TISSUES AND BONES: Extensive venous collateral flow is  noted in the left chest wall and neck. The bony structures are within normal limits. IMPRESSION: 1. No evidence of pulmonary embolism. 2. Large left-sided pleural effusion and small right-sided effusion with associated bibasilar atelectasis. 3. Reflux of contrast into the IVC and hepatic veins with a prominent IVC, a pattern that can be seen with right heart failure. 4. Extensive venous collateral flow in the left chest wall and neck. 5. Mild dilatation of the ascending aorta measuring 4.1 cm.Recommend annual imaging followup by CTA or MRA. This recommendation follows 2010 ACCF/AHA/AATS/ACR/ASA/SCA/SCAI/SIR/STS/SVM Guidelines for the Diagnosis and Management  of Patients with Thoracic Aortic Disease. Circulation. 2010; 121: Z733-z630. Aortic aneurysm NOS (ICD10-I71.9) Electronically signed by: Oneil Devonshire MD 10/14/2024 09:06 PM EST RP Workstation: [id number]   CT Head Wo Contrast Result Date: 10/14/2024 EXAM: CT HEAD WITHOUT CONTRAST 10/14/2024 08:18:46 PM TECHNIQUE: CT of the head was performed without the administration of intravenous contrast. Automated exposure control, iterative reconstruction, and/or weight based adjustment of the mA/kV was utilized to reduce the radiation dose to as low as reasonably achievable. COMPARISON: Comparison study 07/05/2024. CLINICAL HISTORY: Syncope/presyncope, cerebrovascular cause suspected. FINDINGS: BRAIN AND VENTRICLES: Remote left basal ganglia lacunar infarct. No acute hemorrhage. No evidence of acute infarct. No extra-axial collection. No mass effect or midline shift. ORBITS: No acute abnormality. SINUSES: No acute abnormality. SOFT TISSUES AND SKULL: No acute soft tissue abnormality. No skull fracture. IMPRESSION: 1. No acute intracranial abnormality. 2. Remote left basal ganglia lacunar infarct. Electronically signed by: Oneil Devonshire MD 10/14/2024 08:21 PM EST RP Workstation: [id number]   DG Chest 2 View Result Date: 10/14/2024 EXAM: 2 VIEW(S) XRAY OF THE CHEST  10/14/2024 07:43:37 PM COMPARISON: 07/06/2024 CLINICAL HISTORY: dyspnea FINDINGS: LINES, TUBES AND DEVICES: Left chest cardiac pacing device noted. LUNGS AND PLEURA: Small left pleural effusion. Left basilar airspace opacity, likely atelectasis. No pneumothorax. HEART AND MEDIASTINUM: Cardiomegaly. Status post median sternotomy and CABG. Atherosclerotic aortic calcifications. BONES AND SOFT TISSUES: Multilevel degenerative changes of thoracic spine. IMPRESSION: 1. Small left pleural effusion with left basilar atelectasis. Electronically signed by: Pinkie Pebbles MD 10/14/2024 07:46 PM EST RP Workstation: [id number]    Microbiology: Results for orders placed or performed during the hospital encounter of 10/14/24  Resp panel by RT-PCR (RSV, Flu A&B, Covid) Anterior Nasal Swab     Status: None   Collection Time: 10/14/24  7:25 PM   Specimen: Anterior Nasal Swab  Result Value Ref Range Status   SARS Coronavirus 2 by RT PCR NEGATIVE NEGATIVE Final    Comment: (NOTE) SARS-CoV-2 target nucleic acids are NOT DETECTED.  The SARS-CoV-2 RNA is generally detectable in upper respiratory specimens during the acute phase of infection. The lowest concentration of SARS-CoV-2 viral copies this assay can detect is 138 copies/mL. A negative result does not preclude SARS-Cov-2 infection and should not be used as the sole basis for treatment or other patient management decisions. A negative result may occur with  improper specimen collection/handling, submission of specimen other than nasopharyngeal swab, presence of viral mutation(s) within the areas targeted by this assay, and inadequate number of viral copies(<138 copies/mL). A negative result must be combined with clinical observations, patient history, and epidemiological information. The expected result is Negative.  Fact Sheet for Patients:  bloggercourse.com  Fact Sheet for Healthcare Providers:   seriousbroker.it  This test is no t yet approved or cleared by the [place]  FDA and  has been authorized for detection and/or diagnosis of SARS-CoV-2 by FDA under an Emergency Use Authorization (EUA). This EUA will remain  in effect (meaning this test can be used) for the duration of the COVID-19 declaration under Section 564(b)(1) of the Act, 21 U.S.C.section 360bbb-3(b)(1), unless the authorization is terminated  or revoked sooner.       Influenza A by PCR NEGATIVE NEGATIVE Final   Influenza B by PCR NEGATIVE NEGATIVE Final    Comment: (NOTE) The Xpert Xpress SARS-CoV-2/FLU/RSV plus assay is intended as an aid in the diagnosis of influenza from Nasopharyngeal swab specimens and should not be used as a sole basis for treatment. Nasal washings and aspirates are unacceptable  for Xpert Xpress SARS-CoV-2/FLU/RSV testing.  Fact Sheet for Patients: bloggercourse.com  Fact Sheet for Healthcare Providers: seriousbroker.it  This test is not yet approved or cleared by the [place]  FDA and has been authorized for detection and/or diagnosis of SARS-CoV-2 by FDA under an Emergency Use Authorization (EUA). This EUA will remain in effect (meaning this test can be used) for the duration of the COVID-19 declaration under Section 564(b)(1) of the Act, 21 U.S.C. section 360bbb-3(b)(1), unless the authorization is terminated or revoked.     Resp Syncytial Virus by PCR NEGATIVE NEGATIVE Final    Comment: (NOTE) Fact Sheet for Patients: bloggercourse.com  Fact Sheet for Healthcare Providers: seriousbroker.it  This test is not yet approved or cleared by the [place]  FDA and has been authorized for detection and/or diagnosis of SARS-CoV-2 by FDA under an Emergency Use Authorization (EUA). This EUA will remain in effect (meaning this test can be used) for  the duration of the COVID-19 declaration under Section 564(b)(1) of the Act, 21 U.S.C. section 360bbb-3(b)(1), unless the authorization is terminated or revoked.  Performed at [facility], [address] [place], [place] [address]   Body fluid culture w Gram Stain     Status: None (Preliminary result)   Collection Time: 10/16/24 11:17 AM   Specimen: PATH Cytology Pleural fluid  Result Value Ref Range Status   Specimen Description   Final    PLEURAL Performed at [facility], [address] [place], [place] [address]    Special Requests   Final    CYTO PLEU Performed at [facility], [address] [place], [place] [address]    Gram Stain   Final    RARE WBC PRESENT, PREDOMINANTLY MONONUCLEAR NO ORGANISMS SEEN    Culture   Final    NO GROWTH 2 DAYS Performed at [facility], [address] [place], [place] [address]    Report Status PENDING  Incomplete    Labs: CBC: Recent Labs  Lab 10/14/24 1831 10/15/24 0537 10/16/24 0433  WBC 6.3 6.6 6.7  HGB 11.1* 10.5* 11.0*  HCT 36.7* 34.3* 36.1*  MCV 90.2 88.4 88.9  PLT 192 182 206   Basic Metabolic Panel: Recent Labs  Lab 10/14/24 1831 10/15/24 0537 10/16/24 0433 10/17/24 0341  NA 140 140 142 140  K 3.6 3.9 3.8 4.2  CL 100 103 101 99  CO2 30 28 30 30   GLUCOSE 110* 100* 96 91  BUN 25* 25* 27* 31*  CREATININE 1.39* 1.31* 1.23 1.27*  CALCIUM  8.8* 8.9 8.7* 8.7*   Liver Function Tests: Recent Labs  Lab 10/14/24 1831 10/15/24 0537  AST 34 41  ALT 13 11  ALKPHOS 90 83  BILITOT 1.2 1.1  PROT 6.9 6.5  ALBUMIN 3.5 3.3*   CBG: Recent Labs  Lab 10/16/24 1223 10/16/24 1641 10/16/24 2120 10/17/24 0823 10/17/24 1216  GLUCAP 133* 99 112* 93 148*    Discharge time spent: greater than 30 minutes.  Signed: Cresencio Fairly, MD [facility] 10/18/2024 "

## 2024-10-19 ENCOUNTER — Ambulatory Visit: Admitting: Speech Pathology

## 2024-10-19 NOTE — Telephone Encounter (Signed)
 Called patient about his most recent labs. His wife answered the phone and she is on his HIPAA form. She confirmed his DOB. After his doctor reviewed his labs, he said Kidney function is consistent with chronic kidney disease stage IIIA. GFR of 47. No changes are needed at this time. Patient expressed understanding of labs given to her.

## 2024-10-20 ENCOUNTER — Ambulatory Visit

## 2024-10-20 ENCOUNTER — Ambulatory Visit: Admitting: Speech Pathology

## 2024-10-20 DIAGNOSIS — R262 Difficulty in walking, not elsewhere classified: Secondary | ICD-10-CM

## 2024-10-20 DIAGNOSIS — R41841 Cognitive communication deficit: Secondary | ICD-10-CM

## 2024-10-20 DIAGNOSIS — M6281 Muscle weakness (generalized): Secondary | ICD-10-CM

## 2024-10-20 DIAGNOSIS — R2681 Unsteadiness on feet: Secondary | ICD-10-CM

## 2024-10-20 NOTE — Therapy (Signed)
 " OUTPATIENT PHYSICAL THERAPY NEURO TREATMENT/RE-EVALUATION   Patient Name: Bryan Singleton MRN: [id number] DOB:25-Jul-1932, 89 y.o., male Today's Date: 10/20/2024   PCP: Fernande Ophelia [name], MD REFERRING PROVIDER: Fernande Ophelia [name], MD  END OF SESSION:  PT End of Session - 10/20/24 0946     Visit Number 6    Number of Visits 24    Date for Recertification  12/12/24    Progress Note Due on Visit 10    PT Start Time 0946    PT Stop Time 1007    PT Time Calculation (min) 21 min    Activity Tolerance Patient tolerated treatment well    Behavior During Therapy [facility] for tasks assessed/performed            Past Medical History:  Diagnosis Date   COPD (chronic obstructive pulmonary disease) (HCC)    Coronary artery disease    Emphysema of lung (HCC)    Hypertension    Presence of permanent cardiac pacemaker    Renal disorder    Stroke [facility])    Past Surgical History:  Procedure Laterality Date   COLONOSCOPY N/A 07/10/2024   Procedure: COLONOSCOPY;  Surgeon: [name], MD;  Location: [facility] ENDOSCOPY;  Service: Endoscopy;  Laterality: N/A;   ESOPHAGOGASTRODUODENOSCOPY N/A 07/10/2024   Procedure: EGD (ESOPHAGOGASTRODUODENOSCOPY);  Surgeon: [name], MD;  Location: [facility] ENDOSCOPY;  Service: Endoscopy;  Laterality: N/A;   GIVENS CAPSULE STUDY  07/10/2024   Procedure: IMAGING PROCEDURE, GI TRACT, INTRALUMINAL, VIA CAPSULE;  Surgeon: [name], MD;  Location: [facility] ENDOSCOPY;  Service: Endoscopy;;   HOT HEMOSTASIS  07/10/2024   Procedure: EGD, WITH ARGON PLASMA COAGULATION;  Surgeon: [name], MD;  Location: [facility] ENDOSCOPY;  Service: Endoscopy;;   PACEMAKER INSERTION N/A 06/04/2020   Procedure: GENERATOR Change out;  Surgeon: Ammon Blunt, MD;  Location: [facility] ORS;  Service: Cardiovascular;  Laterality: N/A;   POLYPECTOMY  07/10/2024   Procedure: POLYPECTOMY, INTESTINE;  Surgeon: [name], MD;  Location: [facility] ENDOSCOPY;  Service: Endoscopy;;   Patient Active  Problem List   Diagnosis Date Noted   Syncope 10/17/2024   Pleural effusion 10/17/2024   Ischemic stroke (HCC) 10/17/2024   Acute on chronic diastolic CHF (congestive heart failure) (HCC) 10/14/2024   DM (diabetes mellitus), type 2 (HCC) 10/14/2024   Paroxysmal A-fib (HCC) 10/14/2024   Hypothyroidism 10/14/2024   Acute blood loss anemia 07/10/2024   Angiodysplasia of intestinal tract 07/10/2024   Polyp of ascending colon 07/10/2024   Gout 07/07/2024   Symptomatic anemia 07/06/2024   Paroxysmal atrial fibrillation (HCC) 07/06/2024   Fall 08/21/2023   Syncope and collapse 08/19/2023   AKI (acute kidney injury) 08/19/2023   Generalized weakness 08/19/2023   Long term (current) use of warfarin 08/19/2023   SSS (sick sinus syndrome) (HCC) 08/19/2023   Hx of CABG x 5 08/19/2023   Subtherapeutic international normalized ratio (INR) 08/19/2023   Presence of permanent cardiac pacemaker    Coronary artery disease s/p CABG x 5    Controlled type 2 diabetes mellitus with complication, without long-term current use of insulin  (HCC) 03/03/2021   Chronic systolic CHF (congestive heart failure), NYHA class 2 (HCC) 04/19/2017   History of TIAs 02/05/2016   Peripheral vascular disease 02/05/2016   Severe tricuspid valve insufficiency 05/01/2015   Benign essential hypertension 04/24/2015   Moderate mitral insufficiency 09/24/2014   COPD (chronic obstructive pulmonary disease) (HCC) 03/02/2014    ONSET DATE: chronic  REFERRING DIAG: R26.89 (ICD-10-CM) - Imbalance  THERAPY DIAG:  Unsteadiness on feet  Muscle weakness (generalized)  Difficulty in walking, not elsewhere classified  Cognitive communication deficit  Rationale for Evaluation and Treatment: Rehabilitation  SUBJECTIVE:                                                                                                                                                                                             SUBJECTIVE  STATEMENT:  Pt arrived with wife and reports feeling rough since hospitalization last week and reports he right leg is leaking.   Pt accompanied by: significant other  PERTINENT HISTORY:   From hospital D/C summary note on 10/17/2024: Hospital Course: Assessment and Plan:   89 year old man with a history of heart failure, COPD, type 2 diabetes, CAD, paroxysmal A-fib, sick sinus syndrome with [employer] pacemaker who presented to the ED after an episode at home and which he tried to get up from a chair and had a brief loss of consciousness in which his eyes rolled back in his head and speech was mumbling. Lasted approximately 1 to 2 minutes then returned to baseline    1/18: Cardio & Neuro c/s, MRI brain, EEG, echo, US  guided Thora on the left 1/19: EEG and MRI     Presyncope/seizure Acute Stroke - EEG suggestive of mild generalized cerebral dysfunction (encephalopathy). No seizures or epileptiform discharges were seen throughout the recording.  - echo shows EF of 40 to 45%, MRI brain pending - Neuro & cardio seen MRI brain showed small acute infarcts in the right corona radiata, right frontal operculum and right parietal lobe. likely embolic in etiology per Neuro.  Evidence of chronic infarcts noted as well.  This may very well be the etiology of his underlying cognitive issues as well. D/w his PCP on reason why Eliquis  was on hold/stopped. Patient had potential GI bleed/anemia - but none active found. After d/w neuro, Cardio & PCP - he's restarted on Eliquis . Risks and benefits explained. Daughter, wife aware and agreeable with the plan. A1c 6.3.  Echocardiogram reveals an EF of 40-45% with no cardiac source of emboli identified.      Acute on chronic diastolic heart failure Appears volume overloaded with bilateral lower extremity edema and pleural effusion seen on CT scan.   CXR with pleural effusion and LE edema noted on exam. Diuresed with IV lasix  with good UOP. Echo this admission  with EF 40-45%, anterior/apical septal hypokinesis (similar to prior echo in our office), moderate to severe LV dilation, mild MR. MRI brain yesterday with embolic appearing CVAs.  -Transition to PO lasix  60 mg daily at DC -Continue spironolactone  25 mg daily and metoprolol  succinate  25 mg daily.  -Per daughter patient no longer on eliquis  for stroke risk reduction 2/2 AF. They cannot recall why this was stopped. Given MRI brain findings Cardio recommends resuming this which is ok per neurology. I've discussed with his PCP before resuming. -Continue home atorvastatin  10 mg daily.    Large left-sided pleural effusion  Seen on CT chest - also has small right-sided effusion with associated bibasilar atelectasis - Successful ultrasound-guided thoracentesis 1/19 with removal of 700 mL of dark yellow fluid Labs sent and pending at DC but considering patient and family prefers to go home - this is likely transudate due to CHF. I've d/w Pulmo Dr Parris who will set office f/up with him to go over this lab results and further mgmt.    DM type II   Paroxysmal A-fib - Continue metoprolol  25 mg daily - Resume Eliquis  at home   Hypothyroidism Continue levothyroxine        Per Dr. [name] note from 08/14/24, they attempt to walk but find it difficult due to having to 'drag one of my legs'. They can walk about fifty yards with the help of a neighbor's porch handle, with variability influenced by weather conditions.   PAIN:  Are you having pain? No  PRECAUTIONS: Fall  RED FLAGS: None   WEIGHT BEARING RESTRICTIONS: No  FALLS: Has patient fallen in last 6 months? Yes. Number of falls 1  LIVING ENVIRONMENT: Lives with: lives with their spouse Lives in: House/apartment Stairs: Yes: External: 3 steps; bilateral but cannot reach both Has following equipment at home: Single point cane and Walker - 2 wheeled  PLOF: Independent with use of cane  PATIENT GOALS: to get stronger and more  independent  OBJECTIVE:  Note: Objective measures were completed at Evaluation unless otherwise noted.  DIAGNOSTIC FINDINGS: n/a  COGNITION: Overall cognitive status: History of cognitive impairments - at baseline - progressive memory loss    SENSATION: Hx of peripheral neuropathy   COORDINATION: Not tested   POSTURE: rounded shoulders and forward head  LOWER EXTREMITY ROM:     Active  Right Eval Left Eval  Hip flexion    Hip extension    Hip abduction    Hip adduction    Hip internal rotation    Hip external rotation    Knee flexion    Knee extension    Ankle dorsiflexion    Ankle plantarflexion    Ankle inversion    Ankle eversion     (Blank rows = not tested)  LOWER EXTREMITY MMT:    MMT Right Eval Left Eval  Hip flexion 4- 4-  Hip extension    Hip abduction 4 4  Hip adduction 4 4  Hip internal rotation    Hip external rotation    Knee flexion 4+ 4+  Knee extension 4+ 4+  Ankle dorsiflexion 4- 4-  Ankle plantarflexion    Ankle inversion    Ankle eversion    (Blank rows = not tested)  BED MOBILITY:  Not tested  TRANSFERS: Sit to stand: CGA  Assistive device utilized: BUE pushing from seat      RAMP:  Not tested  CURB:  Not tested  STAIRS: Not tested GAIT: Findings: Gait Characteristics: decreased stride length and poor foot clearance- Right, Distance walked: 150', Assistive device utilized:Single point cane, Level of assistance: SBA, and Comments: Patient feels that he is dragging R foot, however no noticeable drag this date (may worsen with fatigue)  FUNCTIONAL TESTS:  5 times sit to  stand: 48.55 seconds with BUE support on chair (unable to complete with no UE support) 10 meter walk test: 0.41 m/s with use of SPC  TUG: 23.35 seconds with use of SPC  6 minute walk test: 300' with SPC, multiple standing rest breaks Berg Balance Scale: 27/56  PATIENT SURVEYS:  ABC scale: The Activities-Specific Balance Confidence (ABC) Scale 0% 10 20  30  40 50 60 70 80 90 100% No confidence<->completely confident  How confident are you that you will not lose your balance or become unsteady when you . . .   Date tested 09/19/24  Walk around the house 80%  2. Walk up or down stairs 10%  3. Bend over and pick up a slipper from in front of a closet floor 0%  4. Reach for a small can off a shelf at eye level 100%  5. Stand on tip toes and reach for something above your head 0%  6. Stand on a chair and reach for something 0%  7. Sweep the floor 0%  8. Walk outside the house to a car parked in the driveway 70%  9. Get into or out of a car 100%  10. Walk across a parking lot to the mall 10%  11. Walk up or down a ramp 0%  12. Walk in a crowded mall where people rapidly walk past you 0%  13. Are bumped into by people as you walk through the mall 0%  14. Step onto or off of an escalator while you are holding onto the railing 10%  15. Step onto or off an escalator while holding onto parcels such that you cannot hold onto the railing 0%  16. Walk outside on icy sidewalks 0%  Total: #/16 23.75%                                                                                                                                 TREATMENT DATE: 10/20/24   Self care/home management:  Assessment of R lower leg Discussion about Recent Hospitalization Discussion about CHF- signs and symptoms Discussion of weeping leg- management PC to PCP (Dr. Fernande) and spoke with front office personnel and was able to obtain a same day appt at 1:30. Patient and wife verbalized gratitude for assistance today and agreeable to going to MD office as scheduled.  Instructed if any worsening SOB or symptoms to go to ED  *unable to assess goals for re-eval  due to above info and will need to perform next visit as appropriate.    PATIENT EDUCATION: Education details:  POC, goals  Person educated: Patient Education method: Explanation, Demonstration, and  Handouts Education comprehension: verbalized understanding and returned demonstration  HOME EXERCISE PROGRAM: Access Code: [email or web address] URL: [email or web address] Date: 09/25/2024 Prepared by: Sidra Simpers  Exercises - Standing Hip Abduction with Counter Support  - 1 x daily - 7 x weekly - 3 sets - 10 reps - Standing March with Counter  Support  - 1 x daily - 7 x weekly - 3 sets - 10 reps - Standing Hip Extension with Counter Support  - 1 x daily - 7 x weekly - 3 sets - 10 reps - Standing 4-Way Leg Reach with Counter Support  - 1 x daily - 7 x weekly - 3 sets - 10 reps - Standing Knee Flexion with Counter Support  - 1 x daily - 7 x weekly - 3 sets - 10 reps  GOALS: Goals reviewed with patient? Yes  SHORT TERM GOALS: Target date: 10/31/2024  Patient will be independent in HEP to improve strength/mobility for better functional independence with ADLs.  Baseline: Goal status: INITIAL   LONG TERM GOALS: Target date: 12/12/2024  Patient will increase ABC scale score >80% to demonstrate better functional mobility and better confidence with ADLs.   Baseline: 09/19/24: 23.75%  Goal status: INITIAL  2.  Patient (> 43 years old) will complete five times sit to stand test in < 15 seconds indicating an increased LE strength and improved balance.  Baseline: 09/19/24: 48.55 seconds with BUE support   Goal status: INITIAL  3.  Patient will improve by 29m (164') in order to demonstrate clinically significant improvement in cardiopulmonary endurance and community ambulation  Baseline: 09/19/24: 300' with SPC and several standing rest breaks Goal status: INITIAL  4.  Patient will increase Berg Balance score by > 6 points to demonstrate decreased fall risk during functional activities. Baseline: 09/19/24: 27/56 Goal status: INITIAL  5.  Patient will increase BLE gross strength to 4+/5 as to improve functional strength for independent gait, increased standing tolerance and increased  ADL ability.  Baseline: 09/19/24: see chart above   Goal status: INITIAL  6.  Patient will reduce timed up and go to <11 seconds to reduce fall risk and demonstrate improved transfer/gait ability.  Baseline: 09/19/24: 23.35 seconds with use of SPC   Goal status: INITIAL  7. Patient will increase 10 meter walk test to >1.30m/s as to improve gait speed for better community ambulation and to reduce fall risk.   Baseline: 09/19/24: 0.41 m/s   Goal status: INITIAL   ASSESSMENT:  CLINICAL IMPRESSION: Patient presents for scheduled PT re-evaluation following re-hospitalization last week for congestive heart failure exacerbation and acute cerebrovascular accident. Functional goal testing was deferred today due to acute medical concerns, including actively weeping right lower extremity, which limited safe participation in mobility and therapeutic assessment. Session focused on skilled self-care and home management, including assessment of the right lower leg, patient and caregiver education regarding signs and symptoms of CHF, discussion of recent hospitalization, and management considerations for the weeping lower extremity. PT contacted PCP, Dr. Fernande, and coordinated a same-day medical appointment at 1:30 PM; patient and spouse expressed understanding and appreciation and agreed to attend. Patient was instructed to seek emergency care if experiencing worsening shortness of breath or other concerning symptoms. Patient remains appropriate for skilled PT services; however, formal re-evaluation of goals will be completed at the next visit as medically appropriate.  OBJECTIVE IMPAIRMENTS: Abnormal gait, cardiopulmonary status limiting activity, decreased activity tolerance, decreased balance, decreased endurance, difficulty walking, and decreased strength.   ACTIVITY LIMITATIONS: bending, squatting, stairs, and locomotion level  PARTICIPATION LIMITATIONS: cleaning, laundry, community activity, and yard  work  PERSONAL FACTORS: Age, Behavior pattern, Past/current experiences, and Time since onset of injury/illness/exacerbation are also affecting patient's functional outcome.   REHAB POTENTIAL: Good  CLINICAL DECISION MAKING: Evolving/moderate complexity  EVALUATION COMPLEXITY: Moderate  PLAN:  PT FREQUENCY: 1-2x/week  PT DURATION: 12 weeks  PLANNED INTERVENTIONS: [id number]- PT Re-evaluation, 97750- Physical Performance Testing, 97110-Therapeutic exercises, 97530- Therapeutic activity, 97112- Neuromuscular re-education, 97535- Self Care, [id number]- Manual therapy, [phone]- Gait training, Patient/Family education, Balance training, Stair training, Joint mobilization, Joint manipulation, DME instructions, Cryotherapy, and Moist heat  PLAN FOR NEXT SESSION:  Plan to reassess goals next visit due to recent hospitalization Dynamic/static balance training, endurance training, foot clearance practice    Reyes LOISE London PT  Physical Therapist- [facility]    10/20/24, 11:12 AM    "

## 2024-10-24 ENCOUNTER — Ambulatory Visit: Admitting: Speech Pathology

## 2024-10-24 ENCOUNTER — Ambulatory Visit

## 2024-10-24 DIAGNOSIS — R41841 Cognitive communication deficit: Secondary | ICD-10-CM

## 2024-10-24 DIAGNOSIS — R2681 Unsteadiness on feet: Secondary | ICD-10-CM

## 2024-10-24 DIAGNOSIS — R262 Difficulty in walking, not elsewhere classified: Secondary | ICD-10-CM

## 2024-10-24 DIAGNOSIS — M6281 Muscle weakness (generalized): Secondary | ICD-10-CM

## 2024-10-24 NOTE — Therapy (Signed)
 " OUTPATIENT PHYSICAL THERAPY NEURO TREATMENT   Patient Name: Bryan Singleton MRN: [id number] DOB:1932/04/21, 89 y.o., male Today's Date: 10/24/2024   PCP: Fernande Ophelia [name], MD REFERRING PROVIDER: Fernande Ophelia [name], MD  END OF SESSION:  PT End of Session - 10/24/24 1116     Visit Number 7    Number of Visits 24    Date for Recertification  12/12/24    Progress Note Due on Visit 10    PT Start Time 1105    PT Stop Time 1148    PT Time Calculation (min) 43 min    Equipment Utilized During Treatment Gait belt    Activity Tolerance Patient tolerated treatment well    Behavior During Therapy WFL for tasks assessed/performed            Past Medical History:  Diagnosis Date   COPD (chronic obstructive pulmonary disease) (HCC)    Coronary artery disease    Emphysema of lung (HCC)    Hypertension    Presence of permanent cardiac pacemaker    Renal disorder    Stroke [facility])    Past Surgical History:  Procedure Laterality Date   COLONOSCOPY N/A 07/10/2024   Procedure: COLONOSCOPY;  Surgeon: [name], MD;  Location: [facility] ENDOSCOPY;  Service: Endoscopy;  Laterality: N/A;   ESOPHAGOGASTRODUODENOSCOPY N/A 07/10/2024   Procedure: EGD (ESOPHAGOGASTRODUODENOSCOPY);  Surgeon: [name], MD;  Location: [facility] ENDOSCOPY;  Service: Endoscopy;  Laterality: N/A;   GIVENS CAPSULE STUDY  07/10/2024   Procedure: IMAGING PROCEDURE, GI TRACT, INTRALUMINAL, VIA CAPSULE;  Surgeon: [name], MD;  Location: [facility] ENDOSCOPY;  Service: Endoscopy;;   HOT HEMOSTASIS  07/10/2024   Procedure: EGD, WITH ARGON PLASMA COAGULATION;  Surgeon: [name], MD;  Location: [facility] ENDOSCOPY;  Service: Endoscopy;;   PACEMAKER INSERTION N/A 06/04/2020   Procedure: GENERATOR Change out;  Surgeon: Ammon Blunt, MD;  Location: [facility] ORS;  Service: Cardiovascular;  Laterality: N/A;   POLYPECTOMY  07/10/2024   Procedure: POLYPECTOMY, INTESTINE;  Surgeon: [name], MD;  Location: [facility] ENDOSCOPY;   Service: Endoscopy;;   Patient Active Problem List   Diagnosis Date Noted   Syncope 10/17/2024   Pleural effusion 10/17/2024   Ischemic stroke (HCC) 10/17/2024   Acute on chronic diastolic CHF (congestive heart failure) (HCC) 10/14/2024   DM (diabetes mellitus), type 2 (HCC) 10/14/2024   Paroxysmal A-fib (HCC) 10/14/2024   Hypothyroidism 10/14/2024   Acute blood loss anemia 07/10/2024   Angiodysplasia of intestinal tract 07/10/2024   Polyp of ascending colon 07/10/2024   Gout 07/07/2024   Symptomatic anemia 07/06/2024   Paroxysmal atrial fibrillation (HCC) 07/06/2024   Fall 08/21/2023   Syncope and collapse 08/19/2023   AKI (acute kidney injury) 08/19/2023   Generalized weakness 08/19/2023   Long term (current) use of warfarin 08/19/2023   SSS (sick sinus syndrome) (HCC) 08/19/2023   Hx of CABG x 5 08/19/2023   Subtherapeutic international normalized ratio (INR) 08/19/2023   Presence of permanent cardiac pacemaker    Coronary artery disease s/p CABG x 5    Controlled type 2 diabetes mellitus with complication, without long-term current use of insulin  (HCC) 03/03/2021   Chronic systolic CHF (congestive heart failure), NYHA class 2 (HCC) 04/19/2017   History of TIAs 02/05/2016   Peripheral vascular disease 02/05/2016   Severe tricuspid valve insufficiency 05/01/2015   Benign essential hypertension 04/24/2015   Moderate mitral insufficiency 09/24/2014   COPD (chronic obstructive pulmonary disease) (HCC) 03/02/2014    ONSET DATE: chronic  REFERRING  DIAG: R26.89 (ICD-10-CM) - Imbalance  THERAPY DIAG:  Unsteadiness on feet  Muscle weakness (generalized)  Difficulty in walking, not elsewhere classified  Cognitive communication deficit  Rationale for Evaluation and Treatment: Rehabilitation  SUBJECTIVE:                                                                                                                                                                                              SUBJECTIVE STATEMENT:  Pt reports glad he went to MD visit last week and received unna boots and was instucted to wear for 4 days. Wife reports they took them off earlier today and he has follow up with Cardiology and his PCP later today.   Pt accompanied by: significant other  PERTINENT HISTORY:   From hospital D/C summary note on 10/17/2024: Hospital Course: Assessment and Plan:   89 year old man with a history of heart failure, COPD, type 2 diabetes, CAD, paroxysmal A-fib, sick sinus syndrome with [employer] pacemaker who presented to the ED after an episode at home and which he tried to get up from a chair and had a brief loss of consciousness in which his eyes rolled back in his head and speech was mumbling. Lasted approximately 1 to 2 minutes then returned to baseline    1/18: Cardio & Neuro c/s, MRI brain, EEG, echo, US  guided Thora on the left 1/19: EEG and MRI     Presyncope/seizure Acute Stroke - EEG suggestive of mild generalized cerebral dysfunction (encephalopathy). No seizures or epileptiform discharges were seen throughout the recording.  - echo shows EF of 40 to 45%, MRI brain pending - Neuro & cardio seen MRI brain showed small acute infarcts in the right corona radiata, right frontal operculum and right parietal lobe. likely embolic in etiology per Neuro.  Evidence of chronic infarcts noted as well.  This may very well be the etiology of his underlying cognitive issues as well. D/w his PCP on reason why Eliquis  was on hold/stopped. Patient had potential GI bleed/anemia - but none active found. After d/w neuro, Cardio & PCP - he's restarted on Eliquis . Risks and benefits explained. Daughter, wife aware and agreeable with the plan. A1c 6.3.  Echocardiogram reveals an EF of 40-45% with no cardiac source of emboli identified.      Acute on chronic diastolic heart failure Appears volume overloaded with bilateral lower extremity edema and pleural effusion seen on  CT scan.   CXR with pleural effusion and LE edema noted on exam. Diuresed with IV lasix  with good UOP. Echo this admission with EF 40-45%, anterior/apical septal hypokinesis (similar to prior echo in our office),  moderate to severe LV dilation, mild MR. MRI brain yesterday with embolic appearing CVAs.  -Transition to PO lasix  60 mg daily at DC -Continue spironolactone  25 mg daily and metoprolol  succinate 25 mg daily.  -Per daughter patient no longer on eliquis  for stroke risk reduction 2/2 AF. They cannot recall why this was stopped. Given MRI brain findings Cardio recommends resuming this which is ok per neurology. I've discussed with his PCP before resuming. -Continue home atorvastatin  10 mg daily.    Large left-sided pleural effusion  Seen on CT chest - also has small right-sided effusion with associated bibasilar atelectasis - Successful ultrasound-guided thoracentesis 1/19 with removal of 700 mL of dark yellow fluid Labs sent and pending at DC but considering patient and family prefers to go home - this is likely transudate due to CHF. I've d/w Pulmo Dr Parris who will set office f/up with him to go over this lab results and further mgmt.    DM type II   Paroxysmal A-fib - Continue metoprolol  25 mg daily - Resume Eliquis  at home   Hypothyroidism Continue levothyroxine        Per Dr. [name] note from 08/14/24, they attempt to walk but find it difficult due to having to 'drag one of my legs'. They can walk about fifty yards with the help of a neighbor's porch handle, with variability influenced by weather conditions.   PAIN:  Are you having pain? No  PRECAUTIONS: Fall  RED FLAGS: None   WEIGHT BEARING RESTRICTIONS: No  FALLS: Has patient fallen in last 6 months? Yes. Number of falls 1  LIVING ENVIRONMENT: Lives with: lives with their spouse Lives in: House/apartment Stairs: Yes: External: 3 steps; bilateral but cannot reach both Has following equipment at home:  Single point cane and Walker - 2 wheeled  PLOF: Independent with use of cane  PATIENT GOALS: to get stronger and more independent  OBJECTIVE:  Note: Objective measures were completed at Evaluation unless otherwise noted.  DIAGNOSTIC FINDINGS: n/a  COGNITION: Overall cognitive status: History of cognitive impairments - at baseline - progressive memory loss    SENSATION: Hx of peripheral neuropathy   COORDINATION: Not tested   POSTURE: rounded shoulders and forward head  LOWER EXTREMITY ROM:     Active  Right Eval Left Eval  Hip flexion    Hip extension    Hip abduction    Hip adduction    Hip internal rotation    Hip external rotation    Knee flexion    Knee extension    Ankle dorsiflexion    Ankle plantarflexion    Ankle inversion    Ankle eversion     (Blank rows = not tested)  LOWER EXTREMITY MMT:    MMT Right Eval Left Eval Right  10/24/2024 Left  10/24/2024  Hip flexion 4- 4- 2- 2-  Hip extension      Hip abduction 4 4 2- 2-  Hip adduction 4 4 2- 2-  Hip internal rotation      Hip external rotation      Knee flexion 4+ 4+ 3+ 3+  Knee extension 4+ 4+ 3+ 3+  Ankle dorsiflexion 4- 4- 3+ 3+  Ankle plantarflexion      Ankle inversion      Ankle eversion      (Blank rows = not tested)  BED MOBILITY:  Not tested  TRANSFERS: Sit to stand: CGA  Assistive device utilized: BUE pushing from seat      RAMP:  Not  tested  CURB:  Not tested  STAIRS: Not tested GAIT: Findings: Gait Characteristics: decreased stride length and poor foot clearance- Right, Distance walked: 150', Assistive device utilized:Single point cane, Level of assistance: SBA, and Comments: Patient feels that he is dragging R foot, however no noticeable drag this date (may worsen with fatigue)  FUNCTIONAL TESTS:  5 times sit to stand: 48.55 seconds with BUE support on chair (unable to complete with no UE support) 10 meter walk test: 0.41 m/s with use of SPC  TUG: 23.35 seconds with  use of SPC  6 minute walk test: 300' with SPC, multiple standing rest breaks Berg Balance Scale: 27/56  PATIENT SURVEYS:  ABC scale: The Activities-Specific Balance Confidence (ABC) Scale 0% 10 20 30  40 50 60 70 80 90 100% No confidence<->completely confident  How confident are you that you will not lose your balance or become unsteady when you . . .   Date tested 09/19/24  Walk around the house 80%  2. Walk up or down stairs 10%  3. Bend over and pick up a slipper from in front of a closet floor 0%  4. Reach for a small can off a shelf at eye level 100%  5. Stand on tip toes and reach for something above your head 0%  6. Stand on a chair and reach for something 0%  7. Sweep the floor 0%  8. Walk outside the house to a car parked in the driveway 70%  9. Get into or out of a car 100%  10. Walk across a parking lot to the mall 10%  11. Walk up or down a ramp 0%  12. Walk in a crowded mall where people rapidly walk past you 0%  13. Are bumped into by people as you walk through the mall 0%  14. Step onto or off of an escalator while you are holding onto the railing 10%  15. Step onto or off an escalator while holding onto parcels such that you cannot hold onto the railing 0%  16. Walk outside on icy sidewalks 0%  Total: #/16 23.75%                                                                                                                                 TREATMENT DATE: 10/24/24   Self care/home management:  Assessment of B lower legs- still swelling present Discussion about CHF- signs and symptoms Discussion of weeping leg- management- Patient took unna boots off as directed but still has some weeping observed on left side of jeans- Going from this visit to cardiology next in same building.   Physical therapy treatment session today consisted of completing assessment of goals and administration of testing as demonstrated and documented in flow sheet, treatment, and goals  section of this note. Addition treatments may be found below.   6 Min Walk Test:  Instructed patient to ambulate as quickly and as safely as  possible for 6 minutes using LRAD. Patient was allowed to take standing rest breaks without stopping the test, but if the patient required a sitting rest break the clock would be stopped and the test would be over.  Results: 510 feet with use of 4WW (155 meters, Avg speed 0.25m/s) using a 4WW with CGA and O2 sat= 98% upon completion and only report of faituge- no significant report of SOB. Results indicate that the patient has reduced endurance with ambulation compared to age matched norms.  Age Matched Norms: 53-69 yo M: 6 F: 55, 41-79 yo M: 70 F: 471, 29-89 yo M: 417 F: 392 MDC: 58.21 meters (190.98 feet) or 50 meters (ANPTA Core Set of Outcome Measures for Adults with Neurologic Conditions, 2018)   Five times Sit to Stand Test (FTSS) Stand up and sit down as quickly as possible 5 times, keeping your arms folded across your chest.    TIME: 25.24 seconds with BUE Support  Times > 13.6 seconds is associated with increased disability and morbidity (Guralnik, 2000) Times > 15 seconds is predictive of recurrent falls in healthy individuals aged 59 and older (Buatois, et al., 2008) Normal performance values in community dwelling individuals aged 11 and older (Bohannon, 2006): 60-69 years: 11.4 seconds 70-79 years: 12.6 seconds 80-89 years: 14.8 seconds  MCID: >= 2.3 seconds for Vestibular Disorders (Meretta, 2006)   10 Meter Walk Test: Patient instructed to walk 10 meters (32.8 ft) as quickly and as safely as possible at their normal speed x2 and at a fast speed x2. Time measured from 2 meter mark to 8 meter mark to accommodate ramp-up and ramp-down.  Normal speed 1: 0.55 m/s Cut off scores: <0.4 m/s = household Ambulator, 0.4-0.8 m/s = limited community Ambulator, >0.8 m/s = community Ambulator, >1.2 m/s = crossing a street, <1.0 = increased fall  risk MCID 0.05 m/s (small), 0.13 m/s (moderate), 0.06 m/s (significant)  (ANPTA Core Set of Outcome Measures for Adults with Neurologic Conditions, 2018)     PATIENT EDUCATION: Education details:  POC, goals  Person educated: Patient Education method: Explanation, Demonstration, and Handouts Education comprehension: verbalized understanding and returned demonstration  HOME EXERCISE PROGRAM: Access Code: [email or web address] URL: [email or web address] Date: 09/25/2024 Prepared by: Sidra Simpers  Exercises - Standing Hip Abduction with Counter Support  - 1 x daily - 7 x weekly - 3 sets - 10 reps - Standing March with Counter Support  - 1 x daily - 7 x weekly - 3 sets - 10 reps - Standing Hip Extension with Counter Support  - 1 x daily - 7 x weekly - 3 sets - 10 reps - Standing 4-Way Leg Reach with Counter Support  - 1 x daily - 7 x weekly - 3 sets - 10 reps - Standing Knee Flexion with Counter Support  - 1 x daily - 7 x weekly - 3 sets - 10 reps  GOALS: Goals reviewed with patient? Yes  SHORT TERM GOALS: Target date: 10/31/2024  Patient will be independent in HEP to improve strength/mobility for better functional independence with ADLs.  Baseline:10/24/2024- Patient states he is walking and doing some squats but admits to not performing his HEP as prescribed. Goal status: ONGOING   LONG TERM GOALS: Target date: 12/12/2024  Patient will increase ABC scale score >80% to demonstrate better functional mobility and better confidence with ADLs.   Baseline: 09/19/24: 23.75%  Goal status: INITIAL  2.  Patient (> 65 years old) will complete five times sit to stand test  in < 15 seconds indicating an increased LE strength and improved balance.  Baseline: 09/19/24: 48.55 seconds with BUE support; 10/24/2024=25.24 seconds with BUE Goal status: INITIAL  3.  Patient will improve by 67m (164') in order to demonstrate clinically significant improvement in cardiopulmonary endurance and  community ambulation  Baseline: 09/19/24: 300' with SPC and several standing rest breaks; 10/14/2024= 510 feet with 4WW Goal status: INITIAL  4.  Patient will increase Berg Balance score by > 6 points to demonstrate decreased fall risk during functional activities. Baseline: 09/19/24: 27/56 Goal status: INITIAL  5.  Patient will increase BLE gross strength to 4+/5 as to improve functional strength for independent gait, increased standing tolerance and increased ADL ability.  Baseline: 09/19/24: see chart above; 10/24/24= See chart-Declining but likely related to recent hospitalization and increased swelling/fluid in BLE.   Goal status: Ongoing  6.  Patient will reduce timed up and go to <11 seconds to reduce fall risk and demonstrate improved transfer/gait ability.  Baseline: 09/19/24: 23.35 seconds with use of SPC; 10/24/24- 22.98 sec with 4WW Goal status: ONGOING  7. Patient will increase 10 meter walk test to >1.56m/s as to improve gait speed for better community ambulation and to reduce fall risk.   Baseline: 09/19/24: 0.41 m/s; 10/24/2024= 0.55 m/s with 4WW  Goal status: ONGOING  ASSESSMENT:  CLINICAL IMPRESSION: The patient was seen today for reassessment and review of progress toward established goals. Examination of both lower legs revealed persistent swelling and continued weeping from the left leg. The patient reported removing the unna boots as directed, though moisture was still noted on the left pant leg. Education was provided regarding signs and symptoms of CHF, expected changes with fluid retention, and appropriate management of weeping edema. The patient is proceeding directly to cardiology following this visit for further evaluation. The treatment session included reassessment of goals and administration of standardized testing. The Six Minute Walk Test was completed with a distance of 510 feet using a four wheeled walker and contact guard assistance. Oxygen saturation remained  at 98 percent with fatigue as the only reported symptom. Performance reflects reduced endurance compared to age matched norms. The Five Times Sit to Stand Test was completed in 25.24 seconds with bilateral upper extremity support, indicating ongoing lower extremity weakness and increased fall risk. The Ten Meter Walk Test demonstrated a normal pace speed of 0.55 meters per second, placing the patient within the limited community ambulation range. Patient education was reinforced regarding the plan of care and goals. The patient demonstrated understanding of the material and was able to return demonstration. Review of the home exercise program indicated partial adherence, as the patient acknowledged performing walking and squats but not completing the full prescribed program. Goal review shows that short and long term goals remain either ongoing or in initial stages. Improvements were noted in the Five Times Sit to Stand Test, Six Minute Walk Test, and Ten Meter Walk Test compared to baseline, though performance continues to indicate elevated fall risk and reduced functional endurance. Lower extremity strength currently appears limited, likely influenced by recent hospitalization and increased fluid accumulation. The patient will continue to benefit from skilled physical therapy to address strength deficits, impaired endurance, balance limitations, gait speed, and functional mobility in order to progress toward higher levels of independence and reduced fall risk.  OBJECTIVE IMPAIRMENTS: Abnormal gait, cardiopulmonary status limiting activity, decreased activity tolerance, decreased balance, decreased endurance, difficulty walking, and decreased strength.   ACTIVITY LIMITATIONS: bending, squatting, stairs, and locomotion  level  PARTICIPATION LIMITATIONS: cleaning, laundry, community activity, and yard work  PERSONAL FACTORS: Age, Behavior pattern, Past/current experiences, and Time since onset of  injury/illness/exacerbation are also affecting patient's functional outcome.   REHAB POTENTIAL: Good  CLINICAL DECISION MAKING: Evolving/moderate complexity  EVALUATION COMPLEXITY: Moderate  PLAN:  PT FREQUENCY: 1-2x/week  PT DURATION: 12 weeks  PLANNED INTERVENTIONS: [id number]- PT Re-evaluation, 97750- Physical Performance Testing, 97110-Therapeutic exercises, 97530- Therapeutic activity, 97112- Neuromuscular re-education, 97535- Self Care, [id number]- Manual therapy, [phone]- Gait training, Patient/Family education, Balance training, Stair training, Joint mobilization, Joint manipulation, DME instructions, Cryotherapy, and Moist heat  PLAN FOR NEXT SESSION:  Reassess BERG and update goal status Dynamic/static balance training, endurance training, foot clearance practice  Follow up from Cardiology and PCP (visits both on 1/27)    Reyes LOISE London PT  Physical Therapist- [facility]    10/24/24, 12:18 PM    "

## 2024-10-26 ENCOUNTER — Ambulatory Visit: Admitting: Physical Therapy

## 2024-10-26 ENCOUNTER — Ambulatory Visit: Admitting: Speech Pathology

## 2024-10-26 DIAGNOSIS — R2681 Unsteadiness on feet: Secondary | ICD-10-CM

## 2024-10-26 DIAGNOSIS — R262 Difficulty in walking, not elsewhere classified: Secondary | ICD-10-CM

## 2024-10-26 DIAGNOSIS — M6281 Muscle weakness (generalized): Secondary | ICD-10-CM

## 2024-10-26 NOTE — Therapy (Signed)
 " OUTPATIENT PHYSICAL THERAPY NEURO TREATMENT   Patient Name: Bryan Singleton MRN: [id number] DOB:13-Mar-1932, 89 y.o., male Today's Date: 10/26/2024   PCP: Fernande Ophelia [name], MD REFERRING PROVIDER: Fernande Ophelia [name], MD  END OF SESSION:  PT End of Session - 10/26/24 1323     Visit Number 8    Number of Visits 24    Date for Recertification  12/12/24    Progress Note Due on Visit 10    PT Start Time 1330    PT Stop Time 1441    PT Time Calculation (min) 71 min    Equipment Utilized During Treatment Gait belt    Activity Tolerance Patient tolerated treatment well    Behavior During Therapy WFL for tasks assessed/performed            Past Medical History:  Diagnosis Date   COPD (chronic obstructive pulmonary disease) (HCC)    Coronary artery disease    Emphysema of lung (HCC)    Hypertension    Presence of permanent cardiac pacemaker    Renal disorder    Stroke [facility])    Past Surgical History:  Procedure Laterality Date   COLONOSCOPY N/A 07/10/2024   Procedure: COLONOSCOPY;  Surgeon: [name], MD;  Location: [facility] ENDOSCOPY;  Service: Endoscopy;  Laterality: N/A;   ESOPHAGOGASTRODUODENOSCOPY N/A 07/10/2024   Procedure: EGD (ESOPHAGOGASTRODUODENOSCOPY);  Surgeon: [name], MD;  Location: [facility] ENDOSCOPY;  Service: Endoscopy;  Laterality: N/A;   GIVENS CAPSULE STUDY  07/10/2024   Procedure: IMAGING PROCEDURE, GI TRACT, INTRALUMINAL, VIA CAPSULE;  Surgeon: [name], MD;  Location: [facility] ENDOSCOPY;  Service: Endoscopy;;   HOT HEMOSTASIS  07/10/2024   Procedure: EGD, WITH ARGON PLASMA COAGULATION;  Surgeon: [name], MD;  Location: [facility] ENDOSCOPY;  Service: Endoscopy;;   PACEMAKER INSERTION N/A 06/04/2020   Procedure: GENERATOR Change out;  Surgeon: Ammon Blunt, MD;  Location: [facility] ORS;  Service: Cardiovascular;  Laterality: N/A;   POLYPECTOMY  07/10/2024   Procedure: POLYPECTOMY, INTESTINE;  Surgeon: [name], MD;  Location: [facility] ENDOSCOPY;   Service: Endoscopy;;   Patient Active Problem List   Diagnosis Date Noted   Syncope 10/17/2024   Pleural effusion 10/17/2024   Ischemic stroke (HCC) 10/17/2024   Acute on chronic diastolic CHF (congestive heart failure) (HCC) 10/14/2024   DM (diabetes mellitus), type 2 (HCC) 10/14/2024   Paroxysmal A-fib (HCC) 10/14/2024   Hypothyroidism 10/14/2024   Acute blood loss anemia 07/10/2024   Angiodysplasia of intestinal tract 07/10/2024   Polyp of ascending colon 07/10/2024   Gout 07/07/2024   Symptomatic anemia 07/06/2024   Paroxysmal atrial fibrillation (HCC) 07/06/2024   Fall 08/21/2023   Syncope and collapse 08/19/2023   AKI (acute kidney injury) 08/19/2023   Generalized weakness 08/19/2023   Long term (current) use of warfarin 08/19/2023   SSS (sick sinus syndrome) (HCC) 08/19/2023   Hx of CABG x 5 08/19/2023   Subtherapeutic international normalized ratio (INR) 08/19/2023   Presence of permanent cardiac pacemaker    Coronary artery disease s/p CABG x 5    Controlled type 2 diabetes mellitus with complication, without long-term current use of insulin  (HCC) 03/03/2021   Chronic systolic CHF (congestive heart failure), NYHA class 2 (HCC) 04/19/2017   History of TIAs 02/05/2016   Peripheral vascular disease 02/05/2016   Severe tricuspid valve insufficiency 05/01/2015   Benign essential hypertension 04/24/2015   Moderate mitral insufficiency 09/24/2014   COPD (chronic obstructive pulmonary disease) (HCC) 03/02/2014    ONSET DATE: chronic  REFERRING  DIAG: R26.89 (ICD-10-CM) - Imbalance  THERAPY DIAG:  No diagnosis found.  Rationale for Evaluation and Treatment: Rehabilitation  SUBJECTIVE:                                                                                                                                                                                             SUBJECTIVE STATEMENT:  Pt reports had legs wrapped again but has continued weeping. Instructed him  to go by doctors office down the hall today for further workup to avoid further exposure to prolonged swelling and fluid retention.   Pt accompanied by: significant other  PERTINENT HISTORY:   From hospital D/C summary note on 10/17/2024: Hospital Course: Assessment and Plan:   89 year old man with a history of heart failure, COPD, type 2 diabetes, CAD, paroxysmal A-fib, sick sinus syndrome with [employer] pacemaker who presented to the ED after an episode at home and which he tried to get up from a chair and had a brief loss of consciousness in which his eyes rolled back in his head and speech was mumbling. Lasted approximately 1 to 2 minutes then returned to baseline    1/18: Cardio & Neuro c/s, MRI brain, EEG, echo, US  guided Thora on the left 1/19: EEG and MRI     Presyncope/seizure Acute Stroke - EEG suggestive of mild generalized cerebral dysfunction (encephalopathy). No seizures or epileptiform discharges were seen throughout the recording.  - echo shows EF of 40 to 45%, MRI brain pending - Neuro & cardio seen MRI brain showed small acute infarcts in the right corona radiata, right frontal operculum and right parietal lobe. likely embolic in etiology per Neuro.  Evidence of chronic infarcts noted as well.  This may very well be the etiology of his underlying cognitive issues as well. D/w his PCP on reason why Eliquis  was on hold/stopped. Patient had potential GI bleed/anemia - but none active found. After d/w neuro, Cardio & PCP - he's restarted on Eliquis . Risks and benefits explained. Daughter, wife aware and agreeable with the plan. A1c 6.3.  Echocardiogram reveals an EF of 40-45% with no cardiac source of emboli identified.      Acute on chronic diastolic heart failure Appears volume overloaded with bilateral lower extremity edema and pleural effusion seen on CT scan.   CXR with pleural effusion and LE edema noted on exam. Diuresed with IV lasix  with good UOP. Echo this admission  with EF 40-45%, anterior/apical septal hypokinesis (similar to prior echo in our office), moderate to severe LV dilation, mild MR. MRI brain yesterday with embolic appearing CVAs.  -Transition to PO lasix  60 mg daily at  DC -Continue spironolactone  25 mg daily and metoprolol  succinate 25 mg daily.  -Per daughter patient no longer on eliquis  for stroke risk reduction 2/2 AF. They cannot recall why this was stopped. Given MRI brain findings Cardio recommends resuming this which is ok per neurology. I've discussed with his PCP before resuming. -Continue home atorvastatin  10 mg daily.    Large left-sided pleural effusion  Seen on CT chest - also has small right-sided effusion with associated bibasilar atelectasis - Successful ultrasound-guided thoracentesis 1/19 with removal of 700 mL of dark yellow fluid Labs sent and pending at DC but considering patient and family prefers to go home - this is likely transudate due to CHF. I've d/w Pulmo Dr Parris who will set office f/up with him to go over this lab results and further mgmt.    DM type II   Paroxysmal A-fib - Continue metoprolol  25 mg daily - Resume Eliquis  at home   Hypothyroidism Continue levothyroxine        Per Dr. [name] note from 08/14/24, they attempt to walk but find it difficult due to having to 'drag one of my legs'. They can walk about fifty yards with the help of a neighbor's porch handle, with variability influenced by weather conditions.   PAIN:  Are you having pain? No  PRECAUTIONS: Fall  RED FLAGS: None   WEIGHT BEARING RESTRICTIONS: No  FALLS: Has patient fallen in last 6 months? Yes. Number of falls 1  LIVING ENVIRONMENT: Lives with: lives with their spouse Lives in: House/apartment Stairs: Yes: External: 3 steps; bilateral but cannot reach both Has following equipment at home: Single point cane and Walker - 2 wheeled  PLOF: Independent with use of cane  PATIENT GOALS: to get stronger and more  independent  OBJECTIVE:  Note: Objective measures were completed at Evaluation unless otherwise noted.  DIAGNOSTIC FINDINGS: n/a  COGNITION: Overall cognitive status: History of cognitive impairments - at baseline - progressive memory loss    SENSATION: Hx of peripheral neuropathy   COORDINATION: Not tested   POSTURE: rounded shoulders and forward head  LOWER EXTREMITY ROM:     Active  Right Eval Left Eval  Hip flexion    Hip extension    Hip abduction    Hip adduction    Hip internal rotation    Hip external rotation    Knee flexion    Knee extension    Ankle dorsiflexion    Ankle plantarflexion    Ankle inversion    Ankle eversion     (Blank rows = not tested)  LOWER EXTREMITY MMT:    MMT Right Eval Left Eval Right  10/24/2024 Left  10/24/2024  Hip flexion 4- 4- 2- 2-  Hip extension      Hip abduction 4 4 2- 2-  Hip adduction 4 4 2- 2-  Hip internal rotation      Hip external rotation      Knee flexion 4+ 4+ 3+ 3+  Knee extension 4+ 4+ 3+ 3+  Ankle dorsiflexion 4- 4- 3+ 3+  Ankle plantarflexion      Ankle inversion      Ankle eversion      (Blank rows = not tested)  BED MOBILITY:  Not tested  TRANSFERS: Sit to stand: CGA  Assistive device utilized: BUE pushing from seat      RAMP:  Not tested  CURB:  Not tested  STAIRS: Not tested GAIT: Findings: Gait Characteristics: decreased stride length and poor foot clearance- Right, Distance  walked: 150', Assistive device utilized:Single point cane, Level of assistance: SBA, and Comments: Patient feels that he is dragging R foot, however no noticeable drag this date (may worsen with fatigue)  FUNCTIONAL TESTS:  5 times sit to stand: 48.55 seconds with BUE support on chair (unable to complete with no UE support) 10 meter walk test: 0.41 m/s with use of SPC  TUG: 23.35 seconds with use of SPC  6 minute walk test: 300' with SPC, multiple standing rest breaks Berg Balance Scale: 27/56  PATIENT  SURVEYS:  ABC scale: The Activities-Specific Balance Confidence (ABC) Scale 0% 10 20 30  40 50 60 70 80 90 100% No confidence<->completely confident  How confident are you that you will not lose your balance or become unsteady when you . . .   Date tested 09/19/24  Walk around the house 80%  2. Walk up or down stairs 10%  3. Bend over and pick up a slipper from in front of a closet floor 0%  4. Reach for a small can off a shelf at eye level 100%  5. Stand on tip toes and reach for something above your head 0%  6. Stand on a chair and reach for something 0%  7. Sweep the floor 0%  8. Walk outside the house to a car parked in the driveway 70%  9. Get into or out of a car 100%  10. Walk across a parking lot to the mall 10%  11. Walk up or down a ramp 0%  12. Walk in a crowded mall where people rapidly walk past you 0%  13. Are bumped into by people as you walk through the mall 0%  14. Step onto or off of an escalator while you are holding onto the railing 10%  15. Step onto or off an escalator while holding onto parcels such that you cannot hold onto the railing 0%  16. Walk outside on icy sidewalks 0%  Total: #/16 23.75%                                                                                                                                 TREATMENT DATE: 10/26/24     Physical Performance Test or Measurement: a  physical performance test(s) or measurement (eg,  musculoskeletal, functional capacity), with written report,  each 15 mins   Patient demonstrates increased fall risk as noted by score of  36 /56 on Berg Balance Scale.  (<36= high risk for falls, close to 100%; 37-45 significant >80%; 46-51 moderate >50%; 52-55 lower >25%)   [facility] PT Assessment - 10/26/24 0001       Standardized Balance Assessment   Standardized Balance Assessment Berg Balance Test      Berg Balance Test   Sit to Stand Able to stand  independently using hands    Standing Unsupported Able to  stand safely 2 minutes    Sitting with Back Unsupported but Feet  Supported on Floor or Stool Able to sit safely and securely 2 minutes    Stand to Sit Controls descent by using hands    Transfers Able to transfer safely, definite need of hands    Standing Unsupported with Eyes Closed Able to stand 10 seconds with supervision    Standing Unsupported with Feet Together Able to place feet together independently and stand for 1 minute with supervision    From Standing, Reach Forward with Outstretched Arm Can reach forward >12 cm safely (5)    From Standing Position, Pick up Object from Floor Unable to pick up shoe, but reaches 2-5 cm (1-2) from shoe and balances independently   pt strained and face reddens with this- ends up using chair and UE for descent but ends up picking up object with this assistance   From Standing Position, Turn to Look Behind Over each Shoulder Looks behind one side only/other side shows less weight shift    Turn 360 Degrees Able to turn 360 degrees safely but slowly    Standing Unsupported, Alternately Place Feet on Step/Stool Able to complete >2 steps/needs minimal assist    Standing Unsupported, One Foot in Front Able to take small step independently and hold 30 seconds    Standing on One Leg Unable to try or needs assist to prevent fall    Total Score 36          TA- To improve functional movements patterns for everyday tasks   Gait with rollator x 170 ft fatigue noted with breathing rate and depth by about 110 ft   Seated heel raise 2 x 15 on incline board   Gait with rollator x 170 ft fatigue noted with breathing rate and depth by about 110 ft   Standing step taps x 10 ea LE without UE assist.   SPO2 remains 98-100% despite fatigue and SOB  Self care :instructed pt to go back to MD regarding weeping of Les bilaterally and how this risks infection and warrante more active treatment.    PATIENT EDUCATION: Education details:  POC, goals  Person educated:  Patient Education method: Explanation, Demonstration, and Handouts Education comprehension: verbalized understanding and returned demonstration  HOME EXERCISE PROGRAM: Access Code: [email or web address] URL: [email or web address] Date: 09/25/2024 Prepared by: Sidra Simpers  Exercises - Standing Hip Abduction with Counter Support  - 1 x daily - 7 x weekly - 3 sets - 10 reps - Standing March with Counter Support  - 1 x daily - 7 x weekly - 3 sets - 10 reps - Standing Hip Extension with Counter Support  - 1 x daily - 7 x weekly - 3 sets - 10 reps - Standing 4-Way Leg Reach with Counter Support  - 1 x daily - 7 x weekly - 3 sets - 10 reps - Standing Knee Flexion with Counter Support  - 1 x daily - 7 x weekly - 3 sets - 10 reps  GOALS: Goals reviewed with patient? Yes  SHORT TERM GOALS: Target date: 10/31/2024  Patient will be independent in HEP to improve strength/mobility for better functional independence with ADLs.  Baseline:10/24/2024- Patient states he is walking and doing some squats but admits to not performing his HEP as prescribed. Goal status: ONGOING   LONG TERM GOALS: Target date: 12/12/2024  Patient will increase ABC scale score >80% to demonstrate better functional mobility and better confidence with ADLs.   Baseline: 09/19/24: 23.75%  Goal status: INITIAL  2.  Patient (> 31 years old) will  complete five times sit to stand test in < 15 seconds indicating an increased LE strength and improved balance.  Baseline: 09/19/24: 48.55 seconds with BUE support; 10/24/2024=25.24 seconds with BUE Goal status: INITIAL  3.  Patient will improve by 73m (164') in order to demonstrate clinically significant improvement in cardiopulmonary endurance and community ambulation  Baseline: 09/19/24: 300' with SPC and several standing rest breaks; 10/14/2024= 510 feet with 4WW Goal status: INITIAL  4.  Patient will increase Berg Balance score by > 6 points to demonstrate decreased fall risk  during functional activities. Baseline: 09/19/24: 27/56 Goal status: INITIAL  5.  Patient will increase BLE gross strength to 4+/5 as to improve functional strength for independent gait, increased standing tolerance and increased ADL ability.  Baseline: 09/19/24: see chart above; 10/24/24= See chart-Declining but likely related to recent hospitalization and increased swelling/fluid in BLE.   Goal status: Ongoing  6.  Patient will reduce timed up and go to <11 seconds to reduce fall risk and demonstrate improved transfer/gait ability.  Baseline: 09/19/24: 23.35 seconds with use of SPC; 10/24/24- 22.98 sec with 4WW Goal status: ONGOING  7. Patient will increase 10 meter walk test to >1.13m/s as to improve gait speed for better community ambulation and to reduce fall risk.   Baseline: 09/19/24: 0.41 m/s; 10/24/2024= 0.55 m/s with 4WW  Goal status: ONGOING  ASSESSMENT:  CLINICAL IMPRESSION:  The patient is a 89 year old male presenting with generalized weakness and balance impairments that significantly increase his fall risk. His Berg Balance Scale score of 36 out of 56 places him in the high-risk category for falls, and his performance suggests difficulty with functional transitions, reduced ability to pick up objects safely, and impaired single limb stability. Gait training revealed notable fatigue by approximately 110 feet with increased work of breathing, though oxygen saturation remained stable. These findings indicate decreased endurance, impaired balance strategies, and compromised safety during mobility.  Continued physical therapy is beneficial to improve functional strength, balance, and endurance, all of which are essential to reducing fall risk and supporting safe independence in daily tasks. Skilled intervention will focus on enhancing movement efficiency, reinforcing safer gait mechanics, and improving tolerance to activity. Ongoing monitoring is also important given his bilateral lower  extremity weeping, which may indicate underlying medical concerns that place him at additional risk and warrant coordinated management with his physician.  OBJECTIVE IMPAIRMENTS: Abnormal gait, cardiopulmonary status limiting activity, decreased activity tolerance, decreased balance, decreased endurance, difficulty walking, and decreased strength.   ACTIVITY LIMITATIONS: bending, squatting, stairs, and locomotion level  PARTICIPATION LIMITATIONS: cleaning, laundry, community activity, and yard work  PERSONAL FACTORS: Age, Behavior pattern, Past/current experiences, and Time since onset of injury/illness/exacerbation are also affecting patient's functional outcome.   REHAB POTENTIAL: Good  CLINICAL DECISION MAKING: Evolving/moderate complexity  EVALUATION COMPLEXITY: Moderate  PLAN:  PT FREQUENCY: 1-2x/week  PT DURATION: 12 weeks  PLANNED INTERVENTIONS: [id number]- PT Re-evaluation, 97750- Physical Performance Testing, 97110-Therapeutic exercises, 97530- Therapeutic activity, 97112- Neuromuscular re-education, 97535- Self Care, [id number]- Manual therapy, [phone]- Gait training, Patient/Family education, Balance training, Stair training, Joint mobilization, Joint manipulation, DME instructions, Cryotherapy, and Moist heat  PLAN FOR NEXT SESSION:  Reassess BERG and update goal status Dynamic/static balance training, endurance training, foot clearance practice  Follow up from Cardiology and PCP (visits both on 1/27)    Lonni KATHEE Gainer PT  Physical Therapist- [facility]   10/26/24, 1:37 PM    "

## 2024-10-31 ENCOUNTER — Ambulatory Visit

## 2024-10-31 ENCOUNTER — Ambulatory Visit: Admitting: Speech Pathology

## 2024-10-31 DIAGNOSIS — R41841 Cognitive communication deficit: Secondary | ICD-10-CM

## 2024-10-31 DIAGNOSIS — R2681 Unsteadiness on feet: Secondary | ICD-10-CM

## 2024-10-31 DIAGNOSIS — M6281 Muscle weakness (generalized): Secondary | ICD-10-CM

## 2024-10-31 DIAGNOSIS — R262 Difficulty in walking, not elsewhere classified: Secondary | ICD-10-CM

## 2024-11-02 ENCOUNTER — Ambulatory Visit

## 2024-11-02 ENCOUNTER — Ambulatory Visit: Admitting: Speech Pathology

## 2024-11-03 ENCOUNTER — Ambulatory Visit

## 2024-11-03 DIAGNOSIS — R41841 Cognitive communication deficit: Secondary | ICD-10-CM

## 2024-11-03 DIAGNOSIS — M6281 Muscle weakness (generalized): Secondary | ICD-10-CM

## 2024-11-03 DIAGNOSIS — R262 Difficulty in walking, not elsewhere classified: Secondary | ICD-10-CM

## 2024-11-03 DIAGNOSIS — R2681 Unsteadiness on feet: Secondary | ICD-10-CM

## 2024-11-03 NOTE — Therapy (Signed)
 " OUTPATIENT PHYSICAL THERAPY NEURO TREATMENT/Physical Therapy Progress Note   Dates of reporting period  09/19/2024   to   11/03/2024   Patient Name: Bryan Singleton MRN: [id number] DOB:11-26-1931, 89 y.o., male Today's Date: 11/03/2024   PCP: Fernande Ophelia [name], MD REFERRING PROVIDER: Fernande Ophelia [name], MD  END OF SESSION:  PT End of Session - 11/03/24 1020     Visit Number 10    Number of Visits 24    Date for Recertification  12/12/24    Progress Note Due on Visit 20    PT Start Time 1015    PT Stop Time 1102    PT Time Calculation (min) 47 min    Equipment Utilized During Treatment Gait belt    Activity Tolerance Patient tolerated treatment well    Behavior During Therapy WFL for tasks assessed/performed              Past Medical History:  Diagnosis Date   COPD (chronic obstructive pulmonary disease) (HCC)    Coronary artery disease    Emphysema of lung (HCC)    Hypertension    Presence of permanent cardiac pacemaker    Renal disorder    Stroke [facility])    Past Surgical History:  Procedure Laterality Date   COLONOSCOPY N/A 07/10/2024   Procedure: COLONOSCOPY;  Surgeon: [name], MD;  Location: [facility] ENDOSCOPY;  Service: Endoscopy;  Laterality: N/A;   ESOPHAGOGASTRODUODENOSCOPY N/A 07/10/2024   Procedure: EGD (ESOPHAGOGASTRODUODENOSCOPY);  Surgeon: [name], MD;  Location: [facility] ENDOSCOPY;  Service: Endoscopy;  Laterality: N/A;   GIVENS CAPSULE STUDY  07/10/2024   Procedure: IMAGING PROCEDURE, GI TRACT, INTRALUMINAL, VIA CAPSULE;  Surgeon: [name], MD;  Location: [facility] ENDOSCOPY;  Service: Endoscopy;;   HOT HEMOSTASIS  07/10/2024   Procedure: EGD, WITH ARGON PLASMA COAGULATION;  Surgeon: [name], MD;  Location: [facility] ENDOSCOPY;  Service: Endoscopy;;   PACEMAKER INSERTION N/A 06/04/2020   Procedure: GENERATOR Change out;  Surgeon: Ammon Blunt, MD;  Location: [facility] ORS;  Service: Cardiovascular;  Laterality: N/A;   POLYPECTOMY  07/10/2024    Procedure: POLYPECTOMY, INTESTINE;  Surgeon: [name], MD;  Location: [facility] ENDOSCOPY;  Service: Endoscopy;;   Patient Active Problem List   Diagnosis Date Noted   Syncope 10/17/2024   Pleural effusion 10/17/2024   Ischemic stroke (HCC) 10/17/2024   Acute on chronic diastolic CHF (congestive heart failure) (HCC) 10/14/2024   DM (diabetes mellitus), type 2 (HCC) 10/14/2024   Paroxysmal A-fib (HCC) 10/14/2024   Hypothyroidism 10/14/2024   Acute blood loss anemia 07/10/2024   Angiodysplasia of intestinal tract 07/10/2024   Polyp of ascending colon 07/10/2024   Gout 07/07/2024   Symptomatic anemia 07/06/2024   Paroxysmal atrial fibrillation (HCC) 07/06/2024   Fall 08/21/2023   Syncope and collapse 08/19/2023   AKI (acute kidney injury) 08/19/2023   Generalized weakness 08/19/2023   Long term (current) use of warfarin 08/19/2023   SSS (sick sinus syndrome) (HCC) 08/19/2023   Hx of CABG x 5 08/19/2023   Subtherapeutic international normalized ratio (INR) 08/19/2023   Presence of permanent cardiac pacemaker    Coronary artery disease s/p CABG x 5    Controlled type 2 diabetes mellitus with complication, without long-term current use of insulin  (HCC) 03/03/2021   Chronic systolic CHF (congestive heart failure), NYHA class 2 (HCC) 04/19/2017   History of TIAs 02/05/2016   Peripheral vascular disease 02/05/2016   Severe tricuspid valve insufficiency 05/01/2015   Benign essential hypertension 04/24/2015   Moderate mitral  insufficiency 09/24/2014   COPD (chronic obstructive pulmonary disease) (HCC) 03/02/2014    ONSET DATE: chronic  REFERRING DIAG: R26.89 (ICD-10-CM) - Imbalance  THERAPY DIAG:  Unsteadiness on feet  Muscle weakness (generalized)  Difficulty in walking, not elsewhere classified  Cognitive communication deficit  Rationale for Evaluation and Treatment: Rehabilitation  SUBJECTIVE:                                                                                                                                                                                              SUBJECTIVE STATEMENT:  Patient reports legs were re-wrapped yesterday- no other news or MD visits.   Pt accompanied by: significant other  PERTINENT HISTORY:   From hospital D/C summary note on 10/17/2024: Hospital Course: Assessment and Plan:   89 year old man with a history of heart failure, COPD, type 2 diabetes, CAD, paroxysmal A-fib, sick sinus syndrome with [employer] pacemaker who presented to the ED after an episode at home and which he tried to get up from a chair and had a brief loss of consciousness in which his eyes rolled back in his head and speech was mumbling. Lasted approximately 1 to 2 minutes then returned to baseline    1/18: Cardio & Neuro c/s, MRI brain, EEG, echo, US  guided Thora on the left 1/19: EEG and MRI     Presyncope/seizure Acute Stroke - EEG suggestive of mild generalized cerebral dysfunction (encephalopathy). No seizures or epileptiform discharges were seen throughout the recording.  - echo shows EF of 40 to 45%, MRI brain pending - Neuro & cardio seen MRI brain showed small acute infarcts in the right corona radiata, right frontal operculum and right parietal lobe. likely embolic in etiology per Neuro.  Evidence of chronic infarcts noted as well.  This may very well be the etiology of his underlying cognitive issues as well. D/w his PCP on reason why Eliquis  was on hold/stopped. Patient had potential GI bleed/anemia - but none active found. After d/w neuro, Cardio & PCP - he's restarted on Eliquis . Risks and benefits explained. Daughter, wife aware and agreeable with the plan. A1c 6.3.  Echocardiogram reveals an EF of 40-45% with no cardiac source of emboli identified.      Acute on chronic diastolic heart failure Appears volume overloaded with bilateral lower extremity edema and pleural effusion seen on CT scan.   CXR with pleural effusion and LE  edema noted on exam. Diuresed with IV lasix  with good UOP. Echo this admission with EF 40-45%, anterior/apical septal hypokinesis (similar to prior echo in our office), moderate to severe LV dilation, mild MR. MRI brain yesterday with  embolic appearing CVAs.  -Transition to PO lasix  60 mg daily at DC -Continue spironolactone  25 mg daily and metoprolol  succinate 25 mg daily.  -Per daughter patient no longer on eliquis  for stroke risk reduction 2/2 AF. They cannot recall why this was stopped. Given MRI brain findings Cardio recommends resuming this which is ok per neurology. I've discussed with his PCP before resuming. -Continue home atorvastatin  10 mg daily.    Large left-sided pleural effusion  Seen on CT chest - also has small right-sided effusion with associated bibasilar atelectasis - Successful ultrasound-guided thoracentesis 1/19 with removal of 700 mL of dark yellow fluid Labs sent and pending at DC but considering patient and family prefers to go home - this is likely transudate due to CHF. I've d/w Pulmo Dr Parris who will set office f/up with him to go over this lab results and further mgmt.    DM type II   Paroxysmal A-fib - Continue metoprolol  25 mg daily - Resume Eliquis  at home   Hypothyroidism Continue levothyroxine        Per Dr. [name] note from 08/14/24, they attempt to walk but find it difficult due to having to 'drag one of my legs'. They can walk about fifty yards with the help of a neighbor's porch handle, with variability influenced by weather conditions.   PAIN:  Are you having pain? No  PRECAUTIONS: Fall  RED FLAGS: None   WEIGHT BEARING RESTRICTIONS: No  FALLS: Has patient fallen in last 6 months? Yes. Number of falls 1  LIVING ENVIRONMENT: Lives with: lives with their spouse Lives in: House/apartment Stairs: Yes: External: 3 steps; bilateral but cannot reach both Has following equipment at home: Single point cane and Walker - 2 wheeled  PLOF:  Independent with use of cane  PATIENT GOALS: to get stronger and more independent  OBJECTIVE:  Note: Objective measures were completed at Evaluation unless otherwise noted.  DIAGNOSTIC FINDINGS: n/a  COGNITION: Overall cognitive status: History of cognitive impairments - at baseline - progressive memory loss    SENSATION: Hx of peripheral neuropathy   COORDINATION: Not tested   POSTURE: rounded shoulders and forward head  LOWER EXTREMITY ROM:     Active  Right Eval Left Eval  Hip flexion    Hip extension    Hip abduction    Hip adduction    Hip internal rotation    Hip external rotation    Knee flexion    Knee extension    Ankle dorsiflexion    Ankle plantarflexion    Ankle inversion    Ankle eversion     (Blank rows = not tested)  LOWER EXTREMITY MMT:    MMT Right Eval Left Eval Right  10/24/2024 Left  10/24/2024  Hip flexion 4- 4- 2- 2-  Hip extension      Hip abduction 4 4 2- 2-  Hip adduction 4 4 2- 2-  Hip internal rotation      Hip external rotation      Knee flexion 4+ 4+ 3+ 3+  Knee extension 4+ 4+ 3+ 3+  Ankle dorsiflexion 4- 4- 3+ 3+  Ankle plantarflexion      Ankle inversion      Ankle eversion      (Blank rows = not tested)  BED MOBILITY:  Not tested  TRANSFERS: Sit to stand: CGA  Assistive device utilized: BUE pushing from seat      RAMP:  Not tested  CURB:  Not tested  STAIRS: Not tested GAIT:  Findings: Gait Characteristics: decreased stride length and poor foot clearance- Right, Distance walked: 150', Assistive device utilized:Single point cane, Level of assistance: SBA, and Comments: Patient feels that he is dragging R foot, however no noticeable drag this date (may worsen with fatigue)  FUNCTIONAL TESTS:  5 times sit to stand: 48.55 seconds with BUE support on chair (unable to complete with no UE support) 10 meter walk test: 0.41 m/s with use of SPC  TUG: 23.35 seconds with use of SPC  6 minute walk test: 300' with SPC,  multiple standing rest breaks Berg Balance Scale: 27/56  PATIENT SURVEYS:  ABC scale: The Activities-Specific Balance Confidence (ABC) Scale 0% 10 20 30  40 50 60 70 80 90 100% No confidence<->completely confident  How confident are you that you will not lose your balance or become unsteady when you . . .   Date tested 09/19/24  Walk around the house 80%  2. Walk up or down stairs 10%  3. Bend over and pick up a slipper from in front of a closet floor 0%  4. Reach for a small can off a shelf at eye level 100%  5. Stand on tip toes and reach for something above your head 0%  6. Stand on a chair and reach for something 0%  7. Sweep the floor 0%  8. Walk outside the house to a car parked in the driveway 70%  9. Get into or out of a car 100%  10. Walk across a parking lot to the mall 10%  11. Walk up or down a ramp 0%  12. Walk in a crowded mall where people rapidly walk past you 0%  13. Are bumped into by people as you walk through the mall 0%  14. Step onto or off of an escalator while you are holding onto the railing 10%  15. Step onto or off an escalator while holding onto parcels such that you cannot hold onto the railing 0%  16. Walk outside on icy sidewalks 0%  Total: #/16 23.75%                                                                                                                                 TREATMENT DATE: 11/03/24    Physical therapy treatment session today consisted of completing assessment of goals and administration of testing as demonstrated and documented in flow sheet, treatment, and goals section of this note. Addition treatments may be found below.   6 Min Walk Test:  Instructed patient to ambulate as quickly and as safely as possible for 6 minutes using LRAD. Patient was allowed to take standing rest breaks without stopping the test, but if the patient required a sitting rest break the clock would be stopped and the test would be over.  Results: 610  feet using a 4WW with CGA *Stopped at 5 min due to SOB- O2 sat=99%. Results indicate that the patient has reduced  endurance with ambulation compared to age matched norms.  Age Matched Norms: 7-69 yo M: 5 F: 59, 59-79 yo M: 69 F: 471, 11-89 yo M: 417 F: 392 MDC: 58.21 meters (190.98 feet) or 50 meters (ANPTA Core Set of Outcome Measures for Adults with Neurologic Conditions, 2018)   Five times Sit to Stand Test (FTSS) Stand up and sit down as quickly as possible 5 times, keeping your arms folded across your chest.    TIME: 21.86 seconds  Times > 13.6 seconds is associated with increased disability and morbidity (Guralnik, 2000) Times > 15 seconds is predictive of recurrent falls in healthy individuals aged 21 and older (Buatois, et al., 2008) Normal performance values in community dwelling individuals aged 81 and older (Bohannon, 2006): 60-69 years: 11.4 seconds 70-79 years: 12.6 seconds 80-89 years: 14.8 seconds  MCID: >= 2.3 seconds for Vestibular Disorders (Meretta, 2006)   TA:  -Step up x 12 reps ea LE with min UE support  -Sit to stand 2 x 10 reps with BUE Support (VC for technique)  NMR:   - Alternating Step tap at support bar- initially requiring some UE support yet progressed to no UE with close CGA x 20 reps x 2 sets (mild wheeze- O2 sat= 97% and HR= 90 bpm)   -Side stepping up/over 1/2 foam roll with minimal UE support x 25 reps.            PATIENT EDUCATION: Education details:  POC, goals  Person educated: Patient Education method: Explanation, Demonstration, and Handouts Education comprehension: verbalized understanding and returned demonstration  HOME EXERCISE PROGRAM: Access Code: [email or web address] URL: [email or web address] Date: 09/25/2024 Prepared by: Sidra Simpers  Exercises - Standing Hip Abduction with Counter Support  - 1 x daily - 7 x weekly - 3 sets - 10 reps - Standing March with Counter Support  - 1 x daily - 7 x weekly - 3 sets  - 10 reps - Standing Hip Extension with Counter Support  - 1 x daily - 7 x weekly - 3 sets - 10 reps - Standing 4-Way Leg Reach with Counter Support  - 1 x daily - 7 x weekly - 3 sets - 10 reps - Standing Knee Flexion with Counter Support  - 1 x daily - 7 x weekly - 3 sets - 10 reps  GOALS: Goals reviewed with patient? Yes  SHORT TERM GOALS: Target date: 10/31/2024  Patient will be independent in HEP to improve strength/mobility for better functional independence with ADLs.  Baseline:10/24/2024- Patient states he is walking and doing some squats but admits to not performing his HEP as prescribed. Goal status: ONGOING   LONG TERM GOALS: Target date: 12/12/2024  Patient will increase ABC scale score >80% to demonstrate better functional mobility and better confidence with ADLs.   Baseline: 09/19/24: 23.75%;  Goal status: INITIAL  2.  Patient (> 55 years old) will complete five times sit to stand test in < 15 seconds indicating an increased LE strength and improved balance.  Baseline: 09/19/24: 48.55 seconds with BUE support; 10/24/2024=25.24 seconds with BUE 11/03/2024= 21.86 sec Goal status: PROGRESSING  3.  Patient will improve by 39m (164') in order to demonstrate clinically significant improvement in cardiopulmonary endurance and community ambulation  Baseline: 09/19/24: 300' with SPC and several standing rest breaks; 10/14/2024= 510 feet with 4WW; 11/03/2024= 610 feet with 4WW in 5 min- stopped due to SOB- O2 sat = 99% Goal status: INITIAL  4.  Patient will increase Berg Balance score by >  6 points to demonstrate decreased fall risk during functional activities. Baseline: 09/19/24: 27/56; 10/26/24= 36/56 Goal status: PROGRESSING  5.  Patient will increase BLE gross strength to 4+/5 as to improve functional strength for independent gait, increased standing tolerance and increased ADL ability.  Baseline: 09/19/24: see chart above; 10/24/24= See chart-Declining but likely related to  recent hospitalization and increased swelling/fluid in BLE.   Goal status: Ongoing  6.  Patient will reduce timed up and go to <11 seconds to reduce fall risk and demonstrate improved transfer/gait ability.  Baseline: 09/19/24: 23.35 seconds with use of SPC; 10/24/24- 22.98 sec with 4WW Goal status: ONGOING  7. Patient will increase 10 meter walk test to >1.68m/s as to improve gait speed for better community ambulation and to reduce fall risk.   Baseline: 09/19/24: 0.41 m/s; 10/24/2024= 0.55 m/s with 4WW  Goal status: ONGOING  ASSESSMENT:  CLINICAL IMPRESSION: The patient demonstrates measurable functional improvement since the previous progress evaluation. Six Minute Walk Test performance increased from three hundred feet at baseline to six hundred ten feet using a four wheeled walker, reflecting enhanced ambulatory capacity even with early termination at five minutes due to shortness of breath. Oxygen saturation remained stable, indicating improved cardiopulmonary tolerance compared to baseline performance. Five Times Sit to Stand has also improved substantially, progressing from forty eight seconds with bilateral upper extremity support at initial evaluation to twenty one point eight six seconds today without reports of loss of balance. Although still slower than age matched norms and above fall risk thresholds, this represents clinically meaningful progress toward established goals. Berg Balance Score has increased from twenty seven to thirty six out of fifty six, indicating reduced fall risk and improved postural control. The patient demonstrates improved movement efficiency during neuromuscular reeducation tasks and requires less upper extremity support during dynamic stepping activities. Endurance limitations and lower extremity strength deficits remain present, though strength and functional tolerance are improving and continue to trend in a positive direction. Overall progress reflects good  rehabilitation potential and consistent response to skilled physical therapy. Continued PT services are recommended to further develop lower extremity strength, balance strategies, gait speed, and cardiopulmonary endurance. Ongoing intervention is expected to help the patient achieve remaining short and long term goals, reduce fall risk, improve community ambulation safety, and promote greater independence in daily activities. Continued skilled progression of therapeutic exercise and gait training will support full restoration of functional mobility and help prevent decline in the presence of medical comorbidities and previous hospitalization.   OBJECTIVE IMPAIRMENTS: Abnormal gait, cardiopulmonary status limiting activity, decreased activity tolerance, decreased balance, decreased endurance, difficulty walking, and decreased strength.   ACTIVITY LIMITATIONS: bending, squatting, stairs, and locomotion level  PARTICIPATION LIMITATIONS: cleaning, laundry, community activity, and yard work  PERSONAL FACTORS: Age, Behavior pattern, Past/current experiences, and Time since onset of injury/illness/exacerbation are also affecting patient's functional outcome.   REHAB POTENTIAL: Good  CLINICAL DECISION MAKING: Evolving/moderate complexity  EVALUATION COMPLEXITY: Moderate  PLAN:  PT FREQUENCY: 1-2x/week  PT DURATION: 12 weeks  PLANNED INTERVENTIONS: [id number]- PT Re-evaluation, 97750- Physical Performance Testing, 97110-Therapeutic exercises, 97530- Therapeutic activity, [id number]- Neuromuscular re-education, 97535- Self Care, [id number]- Manual therapy, [phone]- Gait training, Patient/Family education, Balance training, Stair training, Joint mobilization, Joint manipulation, DME instructions, Cryotherapy, and Moist heat  PLAN FOR NEXT SESSION:  Neuromuscular reeducation, static and dynamic balance progression, and safe reduction of upper extremity support as tolerated.  Reyes LOISE London PT  Physical  Therapist- [facility]   11/03/24,  2:22 PM    "

## 2024-11-06 ENCOUNTER — Ambulatory Visit

## 2024-11-06 ENCOUNTER — Ambulatory Visit: Admitting: Speech Pathology

## 2024-11-08 ENCOUNTER — Ambulatory Visit: Admitting: Speech Pathology

## 2024-11-08 ENCOUNTER — Ambulatory Visit

## 2024-11-13 ENCOUNTER — Ambulatory Visit

## 2024-11-13 ENCOUNTER — Ambulatory Visit: Admitting: Speech Pathology

## 2024-11-15 ENCOUNTER — Ambulatory Visit: Admitting: Speech Pathology

## 2024-11-15 ENCOUNTER — Ambulatory Visit

## 2024-11-20 ENCOUNTER — Ambulatory Visit

## 2024-11-20 ENCOUNTER — Ambulatory Visit: Admitting: Speech Pathology

## 2024-11-22 ENCOUNTER — Ambulatory Visit: Admitting: Speech Pathology

## 2024-11-22 ENCOUNTER — Ambulatory Visit

## 2024-11-27 ENCOUNTER — Ambulatory Visit

## 2024-11-27 ENCOUNTER — Ambulatory Visit: Admitting: Speech Pathology

## 2024-11-29 ENCOUNTER — Ambulatory Visit

## 2024-11-29 ENCOUNTER — Ambulatory Visit: Admitting: Speech Pathology

## 2024-12-04 ENCOUNTER — Ambulatory Visit

## 2024-12-04 ENCOUNTER — Ambulatory Visit: Admitting: Speech Pathology

## 2024-12-06 ENCOUNTER — Ambulatory Visit: Admitting: Speech Pathology

## 2024-12-06 ENCOUNTER — Ambulatory Visit

## 2024-12-11 ENCOUNTER — Ambulatory Visit: Admitting: Speech Pathology

## 2024-12-11 ENCOUNTER — Ambulatory Visit

## 2024-12-13 ENCOUNTER — Ambulatory Visit: Admitting: Speech Pathology

## 2024-12-13 ENCOUNTER — Ambulatory Visit

## 2024-12-18 ENCOUNTER — Ambulatory Visit

## 2024-12-18 ENCOUNTER — Ambulatory Visit: Admitting: Speech Pathology

## 2024-12-20 ENCOUNTER — Ambulatory Visit

## 2024-12-20 ENCOUNTER — Ambulatory Visit: Admitting: Speech Pathology

## 2024-12-25 ENCOUNTER — Ambulatory Visit: Admitting: Speech Pathology

## 2024-12-25 ENCOUNTER — Ambulatory Visit

## 2024-12-27 ENCOUNTER — Ambulatory Visit

## 2024-12-27 ENCOUNTER — Ambulatory Visit: Admitting: Speech Pathology

## 2025-01-01 ENCOUNTER — Ambulatory Visit

## 2025-01-01 ENCOUNTER — Ambulatory Visit: Admitting: Speech Pathology

## 2025-01-03 ENCOUNTER — Ambulatory Visit

## 2025-01-03 ENCOUNTER — Ambulatory Visit: Admitting: Speech Pathology

## 2025-01-08 ENCOUNTER — Ambulatory Visit

## 2025-01-08 ENCOUNTER — Ambulatory Visit: Admitting: Speech Pathology

## 2025-01-10 ENCOUNTER — Ambulatory Visit

## 2025-01-10 ENCOUNTER — Ambulatory Visit: Admitting: Speech Pathology

## 2025-01-15 ENCOUNTER — Ambulatory Visit: Admitting: Speech Pathology

## 2025-01-15 ENCOUNTER — Ambulatory Visit

## 2025-01-17 ENCOUNTER — Ambulatory Visit

## 2025-01-17 ENCOUNTER — Ambulatory Visit: Admitting: Speech Pathology
